# Patient Record
Sex: Female | Born: 1940
Health system: Southern US, Community
[De-identification: ages and names within clinical notes are randomized; demographics above are authoritative.]

## PROBLEM LIST (undated history)

## (undated) DIAGNOSIS — K219 Gastro-esophageal reflux disease without esophagitis: Secondary | ICD-10-CM

## (undated) DIAGNOSIS — T7840XA Allergy, unspecified, initial encounter: Secondary | ICD-10-CM

## (undated) DIAGNOSIS — I10 Essential (primary) hypertension: Secondary | ICD-10-CM

## (undated) DIAGNOSIS — G5600 Carpal tunnel syndrome, unspecified upper limb: Secondary | ICD-10-CM

## (undated) DIAGNOSIS — E7800 Pure hypercholesterolemia, unspecified: Secondary | ICD-10-CM

## (undated) HISTORY — PX: EYE SURGERY: SHX253

## (undated) HISTORY — PX: BREAST CYST ASPIRATION: SHX578

## (undated) HISTORY — PX: CARPAL TUNNEL RELEASE: SHX101

## (undated) HISTORY — DX: Carpal tunnel syndrome, unspecified upper limb: G56.00

## (undated) HISTORY — DX: Pure hypercholesterolemia, unspecified: E78.00

## (undated) HISTORY — DX: Allergy, unspecified, initial encounter: T78.40XA

## (undated) HISTORY — PX: TONSILLECTOMY: SUR1361

## (undated) HISTORY — DX: Essential (primary) hypertension: I10

## (undated) HISTORY — DX: Gastro-esophageal reflux disease without esophagitis: K21.9

## (undated) HISTORY — PX: COLONOSCOPY: SHX174

---
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
## 1997-04-07 DIAGNOSIS — G5600 Carpal tunnel syndrome, unspecified upper limb: Secondary | ICD-10-CM

## 1997-04-07 HISTORY — DX: Carpal tunnel syndrome, unspecified upper limb: G56.00

## 2004-01-31 ENCOUNTER — Ambulatory Visit: Payer: Self-pay | Admitting: Internal Medicine

## 2005-02-03 ENCOUNTER — Ambulatory Visit: Payer: Self-pay | Admitting: Unknown Physician Specialty

## 2005-02-17 ENCOUNTER — Ambulatory Visit: Payer: Self-pay | Admitting: Internal Medicine

## 2006-02-04 ENCOUNTER — Ambulatory Visit: Payer: Self-pay | Admitting: Internal Medicine

## 2006-02-17 ENCOUNTER — Ambulatory Visit: Payer: Self-pay | Admitting: Gastroenterology

## 2006-07-23 ENCOUNTER — Ambulatory Visit: Payer: Self-pay | Admitting: Gastroenterology

## 2006-08-31 ENCOUNTER — Ambulatory Visit: Payer: Self-pay | Admitting: Gastroenterology

## 2007-03-16 ENCOUNTER — Ambulatory Visit: Payer: Self-pay | Admitting: Internal Medicine

## 2007-09-15 ENCOUNTER — Ambulatory Visit: Payer: Self-pay | Admitting: Internal Medicine

## 2007-12-14 ENCOUNTER — Ambulatory Visit: Payer: Self-pay | Admitting: Internal Medicine

## 2007-12-20 ENCOUNTER — Ambulatory Visit: Payer: Self-pay | Admitting: Internal Medicine

## 2007-12-28 ENCOUNTER — Ambulatory Visit: Payer: Self-pay

## 2008-03-16 ENCOUNTER — Ambulatory Visit: Payer: Self-pay | Admitting: Internal Medicine

## 2008-10-20 ENCOUNTER — Ambulatory Visit: Payer: Self-pay | Admitting: Orthopedic Surgery

## 2008-12-28 ENCOUNTER — Ambulatory Visit: Payer: Self-pay | Admitting: Orthopedic Surgery

## 2009-03-19 ENCOUNTER — Ambulatory Visit: Payer: Self-pay | Admitting: Internal Medicine

## 2009-05-02 ENCOUNTER — Ambulatory Visit: Payer: Self-pay

## 2010-03-27 ENCOUNTER — Ambulatory Visit: Payer: Self-pay | Admitting: Internal Medicine

## 2011-04-07 ENCOUNTER — Ambulatory Visit: Payer: Self-pay | Admitting: Internal Medicine

## 2011-04-07 LAB — HM MAMMOGRAPHY

## 2011-04-09 DIAGNOSIS — R7309 Other abnormal glucose: Secondary | ICD-10-CM | POA: Diagnosis not present

## 2011-04-09 DIAGNOSIS — E7800 Pure hypercholesterolemia, unspecified: Secondary | ICD-10-CM | POA: Diagnosis not present

## 2011-04-09 DIAGNOSIS — J309 Allergic rhinitis, unspecified: Secondary | ICD-10-CM | POA: Diagnosis not present

## 2011-04-09 DIAGNOSIS — I10 Essential (primary) hypertension: Secondary | ICD-10-CM | POA: Diagnosis not present

## 2011-06-03 DIAGNOSIS — I10 Essential (primary) hypertension: Secondary | ICD-10-CM | POA: Diagnosis not present

## 2011-06-03 DIAGNOSIS — E7800 Pure hypercholesterolemia, unspecified: Secondary | ICD-10-CM | POA: Diagnosis not present

## 2011-06-04 DIAGNOSIS — R5381 Other malaise: Secondary | ICD-10-CM | POA: Diagnosis not present

## 2011-06-04 DIAGNOSIS — R7989 Other specified abnormal findings of blood chemistry: Secondary | ICD-10-CM | POA: Diagnosis not present

## 2011-06-11 DIAGNOSIS — L570 Actinic keratosis: Secondary | ICD-10-CM | POA: Diagnosis not present

## 2011-06-11 DIAGNOSIS — L821 Other seborrheic keratosis: Secondary | ICD-10-CM | POA: Diagnosis not present

## 2011-08-15 DIAGNOSIS — R7309 Other abnormal glucose: Secondary | ICD-10-CM | POA: Diagnosis not present

## 2011-08-15 DIAGNOSIS — E7800 Pure hypercholesterolemia, unspecified: Secondary | ICD-10-CM | POA: Diagnosis not present

## 2011-08-15 DIAGNOSIS — M549 Dorsalgia, unspecified: Secondary | ICD-10-CM | POA: Diagnosis not present

## 2011-08-15 DIAGNOSIS — R5383 Other fatigue: Secondary | ICD-10-CM | POA: Diagnosis not present

## 2011-08-15 DIAGNOSIS — R7989 Other specified abnormal findings of blood chemistry: Secondary | ICD-10-CM | POA: Diagnosis not present

## 2011-08-15 LAB — HEPATIC FUNCTION PANEL: ALT: 20 U/L (ref 7–35)

## 2011-08-15 LAB — HEMOGLOBIN A1C: Hgb A1c MFr Bld: 6.1 % — AB (ref 4.0–6.0)

## 2011-08-15 LAB — BASIC METABOLIC PANEL: Creatinine: 0.8 mg/dL (ref <=1.1)

## 2011-08-15 LAB — TSH: TSH: 1.53 u[IU]/mL (ref <=5.90)

## 2011-08-15 LAB — LIPID PANEL
Cholesterol: 207 mg/dL — AB (ref 0–200)
HDL: 44 mg/dL (ref 35–70)

## 2011-10-31 DIAGNOSIS — IMO0002 Reserved for concepts with insufficient information to code with codable children: Secondary | ICD-10-CM | POA: Diagnosis not present

## 2011-10-31 DIAGNOSIS — M9981 Other biomechanical lesions of cervical region: Secondary | ICD-10-CM | POA: Diagnosis not present

## 2011-10-31 DIAGNOSIS — M999 Biomechanical lesion, unspecified: Secondary | ICD-10-CM | POA: Diagnosis not present

## 2011-10-31 DIAGNOSIS — M5030 Other cervical disc degeneration, unspecified cervical region: Secondary | ICD-10-CM | POA: Diagnosis not present

## 2011-11-12 DIAGNOSIS — M5030 Other cervical disc degeneration, unspecified cervical region: Secondary | ICD-10-CM | POA: Diagnosis not present

## 2011-11-12 DIAGNOSIS — M5137 Other intervertebral disc degeneration, lumbosacral region: Secondary | ICD-10-CM | POA: Diagnosis not present

## 2011-11-12 DIAGNOSIS — M9981 Other biomechanical lesions of cervical region: Secondary | ICD-10-CM | POA: Diagnosis not present

## 2011-11-12 DIAGNOSIS — M999 Biomechanical lesion, unspecified: Secondary | ICD-10-CM | POA: Diagnosis not present

## 2012-01-09 ENCOUNTER — Telehealth: Payer: Self-pay | Admitting: Internal Medicine

## 2012-01-09 DIAGNOSIS — Z139 Encounter for screening, unspecified: Secondary | ICD-10-CM

## 2012-01-09 NOTE — Telephone Encounter (Signed)
Pt called made appointment for jan. She stated she needs to have her mammogram before the end of year She goes to [place].  Pt would like appointment 9-10 am Can you place order for this

## 2012-01-14 DIAGNOSIS — H35369 Drusen (degenerative) of macula, unspecified eye: Secondary | ICD-10-CM | POA: Diagnosis not present

## 2012-03-15 ENCOUNTER — Other Ambulatory Visit: Payer: Self-pay | Admitting: *Deleted

## 2012-03-15 NOTE — Telephone Encounter (Signed)
Script faxed to pharmacy

## 2012-04-13 ENCOUNTER — Encounter: Payer: Self-pay | Admitting: Internal Medicine

## 2012-04-13 ENCOUNTER — Ambulatory Visit: Payer: Self-pay | Admitting: Internal Medicine

## 2012-04-13 DIAGNOSIS — Z1231 Encounter for screening mammogram for malignant neoplasm of breast: Secondary | ICD-10-CM | POA: Diagnosis not present

## 2012-04-22 ENCOUNTER — Other Ambulatory Visit: Payer: Self-pay | Admitting: Internal Medicine

## 2012-04-22 MED ORDER — CLONAZEPAM 0.5 MG PO TABS: 0.5000 mg | ORAL_TABLET | Freq: Every day | ORAL | Status: DC | PRN

## 2012-04-22 NOTE — Telephone Encounter (Signed)
Prescription faxed to pharmacy.

## 2012-04-22 NOTE — Telephone Encounter (Signed)
Clonazepam 0.5 mg   #30

## 2012-04-26 ENCOUNTER — Encounter: Payer: Self-pay | Admitting: Internal Medicine

## 2012-04-28 ENCOUNTER — Ambulatory Visit (INDEPENDENT_AMBULATORY_CARE_PROVIDER_SITE_OTHER): Payer: Medicare Other | Admitting: Internal Medicine

## 2012-04-28 ENCOUNTER — Encounter: Payer: Self-pay | Admitting: *Deleted

## 2012-04-28 ENCOUNTER — Encounter: Payer: Self-pay | Admitting: Internal Medicine

## 2012-04-28 VITALS — BP 126/80 | HR 84 | Temp 98.1°F | Ht 62.5 in | Wt 165.5 lb

## 2012-04-28 DIAGNOSIS — R7309 Other abnormal glucose: Secondary | ICD-10-CM

## 2012-04-28 DIAGNOSIS — E7800 Pure hypercholesterolemia, unspecified: Secondary | ICD-10-CM | POA: Diagnosis not present

## 2012-04-28 DIAGNOSIS — I10 Essential (primary) hypertension: Secondary | ICD-10-CM

## 2012-04-28 DIAGNOSIS — Z9109 Other allergy status, other than to drugs and biological substances: Secondary | ICD-10-CM | POA: Diagnosis not present

## 2012-04-28 DIAGNOSIS — R739 Hyperglycemia, unspecified: Secondary | ICD-10-CM

## 2012-04-28 MED ORDER — FLUTICASONE PROPIONATE 50 MCG/ACT NA SUSP: 2.0000 | Freq: Every day | NASAL | Status: DC

## 2012-04-28 MED ORDER — AZELASTINE HCL 0.1 % NA SOLN: 2.0000 | Freq: Every day | NASAL | Status: DC

## 2012-05-03 ENCOUNTER — Encounter: Payer: Self-pay | Admitting: Internal Medicine

## 2012-05-03 DIAGNOSIS — R739 Hyperglycemia, unspecified: Secondary | ICD-10-CM | POA: Insufficient documentation

## 2012-05-03 DIAGNOSIS — I10 Essential (primary) hypertension: Secondary | ICD-10-CM | POA: Insufficient documentation

## 2012-05-03 DIAGNOSIS — Z9109 Other allergy status, other than to drugs and biological substances: Secondary | ICD-10-CM | POA: Insufficient documentation

## 2012-05-03 DIAGNOSIS — E7800 Pure hypercholesterolemia, unspecified: Secondary | ICD-10-CM | POA: Insufficient documentation

## 2012-05-03 NOTE — Assessment & Plan Note (Signed)
Low carb diet, exercise and weight loss.  Check metabolic panel and a1c.   

## 2012-05-03 NOTE — Assessment & Plan Note (Signed)
Low cholesterol diet and exercise.  Weight loss.  Desires not to take cholesterol medication.  Check lipid panel.   

## 2012-05-03 NOTE — Assessment & Plan Note (Signed)
Blood pressure as outlined.  Have her spot check her pressure.  Record.  Same medication regimen.  States she does not always take it regularly.  Have her take her meds regularly.  Follow.  Check metabolic panel.

## 2012-05-03 NOTE — Assessment & Plan Note (Signed)
Intermittent flares.  Will restart her flonase, astelin and otc antihistamine.  Follow.  Notify me if persistent problems.

## 2012-05-03 NOTE — Progress Notes (Signed)
  Subjective:    [name], female    DOB: [birth year], 72 y.o.   MRN: [id number]  HPI 72 year old female with past history of reoccurring allergy problems, GERD, hypertension and hypercholesterolemia who comes in today for a scheduled follow up.  She states she has been doing relatively well.  She has had some intermittent problems with her allergies.  Feels stopped up at times.  No fever.  Has not been using her flonase or astelin.  Has been using some saline.  States her blood pressure checks (outside) - have been averaging 120-140 systolic readings.  Bowels are better. States she does not need the senokot now.  No urine change.    Past Medical History  Diagnosis Date  . Hypertension   . Hypercholesterolemia   . Allergy   . Carpal tunnel syndrome   . GERD (gastroesophageal reflux disease)     Current Outpatient Prescriptions on File Prior to Visit  Medication Sig Dispense Refill  . clonazePAM (KLONOPIN) 0.5 MG tablet Take 1 tablet (0.5 mg total) by mouth daily as needed.  30 tablet  0  . fluticasone (FLONASE) 50 MCG/ACT nasal spray Place 2 sprays into the nose daily.  16 g  3  . hydrochlorothiazide (HYDRODIURIL) 12.5 MG tablet Take 12.5 mg by mouth daily.      Marland Kitchen azelastine (ASTELIN) 137 MCG/SPRAY nasal spray Place 2 sprays into the nose daily. Use in each nostril as directed  30 mL  2  . lisinopril (PRINIVIL,ZESTRIL) 40 MG tablet Take 40 mg by mouth daily.        Review of Systems Patient denies any headache, lightheadedness or dizziness.  Some allergy symptoms as outlined.  No chest pain, tightness or palpitations.  No increased shortness of breath, cough or congestion.  No nausea or vomiting.  No acid reflux.  No abdominal pain or cramping.  No bowel change, such as diarrhea, constipation, BRBPR or melana.  No urine change.        Objective:   Physical Exam Filed Vitals:   04/28/12 0942  BP: 126/80  Pulse: 84  Temp: 98.1 F (74.88 C)   72 year old female in no acute  distress.   HEENT:  Nares- clear.  Oropharynx - without lesions. NECK:  Supple.  Nontender.  No audible bruit.  HEART:  Appears to be regular. LUNGS:  No crackles or wheezing audible.  Respirations even and unlabored.  RADIAL PULSE:  Equal bilaterally.   ABDOMEN:  Soft, nontender.  Bowel sounds present and normal.  No audible abdominal bruit.    EXTREMITIES:  No increased edema present.  DP pulses palpable and equal bilaterally.           Assessment & Plan:  GI.  Last colonoscopy 02/17/06 - normal.  Recommend repeat colonoscopy 2017.  Bowels better.   HEALTH MAINTENANCE.  Physical 08/15/11.  Colonoscopy as outlined above.  Pap 08/13/10 - negative.  Mammogram 04/07/11 - Birads II.  Mammogram 04/13/12 - BiRADS II.

## 2012-05-07 ENCOUNTER — Other Ambulatory Visit: Payer: Federal, State, Local not specified - PPO

## 2012-05-31 ENCOUNTER — Other Ambulatory Visit: Payer: Self-pay | Admitting: *Deleted

## 2012-05-31 MED ORDER — CLONAZEPAM 0.5 MG PO TABS: 0.5000 mg | ORAL_TABLET | Freq: Every day | ORAL | Status: DC | PRN

## 2012-07-12 ENCOUNTER — Other Ambulatory Visit: Payer: Self-pay | Admitting: *Deleted

## 2012-07-13 MED ORDER — CLONAZEPAM 0.5 MG PO TABS: 0.5000 mg | ORAL_TABLET | Freq: Every day | ORAL | Status: DC | PRN

## 2012-08-10 ENCOUNTER — Other Ambulatory Visit (INDEPENDENT_AMBULATORY_CARE_PROVIDER_SITE_OTHER): Payer: Medicare Other

## 2012-08-10 DIAGNOSIS — R7309 Other abnormal glucose: Secondary | ICD-10-CM | POA: Diagnosis not present

## 2012-08-10 DIAGNOSIS — E7800 Pure hypercholesterolemia, unspecified: Secondary | ICD-10-CM

## 2012-08-10 DIAGNOSIS — R739 Hyperglycemia, unspecified: Secondary | ICD-10-CM

## 2012-08-10 DIAGNOSIS — I10 Essential (primary) hypertension: Secondary | ICD-10-CM | POA: Diagnosis not present

## 2012-08-10 LAB — COMPREHENSIVE METABOLIC PANEL
ALT: 18 U/L (ref 0–35)
AST: 23 U/L (ref 0–37)
Albumin: 3.9 g/dL (ref 3.5–5.2)
BUN: 17 mg/dL (ref 6–23)
Calcium: 9.3 mg/dL (ref 8.4–10.5)
Chloride: 106 mEq/L (ref 96–112)
Potassium: 4.5 mEq/L (ref 3.5–5.1)
Total Protein: 7 g/dL (ref 6.0–8.3)

## 2012-08-10 LAB — LIPID PANEL
Cholesterol: 188 mg/dL (ref 0–200)
LDL Cholesterol: 120 mg/dL — ABNORMAL HIGH (ref 0–99)
Total CHOL/HDL Ratio: 5

## 2012-08-24 ENCOUNTER — Encounter: Payer: Federal, State, Local not specified - PPO | Admitting: Internal Medicine

## 2012-10-07 ENCOUNTER — Other Ambulatory Visit: Payer: Self-pay | Admitting: *Deleted

## 2012-10-07 MED ORDER — CLONAZEPAM 0.5 MG PO TABS: 0.5000 mg | ORAL_TABLET | Freq: Every day | ORAL | Status: DC | PRN

## 2012-10-07 NOTE — Telephone Encounter (Signed)
Refilled clonazepam #30 with one refill.   

## 2012-10-07 NOTE — Telephone Encounter (Signed)
faxed

## 2012-10-13 ENCOUNTER — Other Ambulatory Visit (HOSPITAL_COMMUNITY)
Admission: RE | Admit: 2012-10-13 | Discharge: 2012-10-13 | Disposition: A | Discharge status: Home / Self Care | Payer: Medicare Other | Source: Ambulatory Visit | Attending: Internal Medicine | Admitting: Internal Medicine

## 2012-10-13 ENCOUNTER — Ambulatory Visit (INDEPENDENT_AMBULATORY_CARE_PROVIDER_SITE_OTHER): Payer: Medicare Other | Admitting: Internal Medicine

## 2012-10-13 ENCOUNTER — Encounter: Payer: Self-pay | Admitting: Internal Medicine

## 2012-10-13 VITALS — BP 130/90 | HR 72 | Temp 98.4°F | Ht 62.25 in | Wt 163.5 lb

## 2012-10-13 DIAGNOSIS — R739 Hyperglycemia, unspecified: Secondary | ICD-10-CM

## 2012-10-13 DIAGNOSIS — Z1211 Encounter for screening for malignant neoplasm of colon: Secondary | ICD-10-CM | POA: Diagnosis not present

## 2012-10-13 DIAGNOSIS — R7309 Other abnormal glucose: Secondary | ICD-10-CM

## 2012-10-13 DIAGNOSIS — E7800 Pure hypercholesterolemia, unspecified: Secondary | ICD-10-CM | POA: Diagnosis not present

## 2012-10-13 DIAGNOSIS — I10 Essential (primary) hypertension: Secondary | ICD-10-CM | POA: Diagnosis not present

## 2012-10-13 DIAGNOSIS — Z9109 Other allergy status, other than to drugs and biological substances: Secondary | ICD-10-CM

## 2012-10-13 DIAGNOSIS — Z124 Encounter for screening for malignant neoplasm of cervix: Secondary | ICD-10-CM | POA: Diagnosis not present

## 2012-10-15 ENCOUNTER — Encounter: Payer: Self-pay | Admitting: *Deleted

## 2012-10-16 ENCOUNTER — Encounter: Payer: Self-pay | Admitting: Internal Medicine

## 2012-10-16 NOTE — Assessment & Plan Note (Signed)
Intermittent flares.  Use flonase, astelin and otc antihistamine.  Follow.  Notify me if persistent problems.   

## 2012-10-16 NOTE — Assessment & Plan Note (Signed)
Low carb diet, exercise and weight loss.  Follow metabolic panel and a1c.  Last a1c 08/10/12 - 6.2.

## 2012-10-16 NOTE — Assessment & Plan Note (Signed)
Low cholesterol diet and exercise.  Weight loss.  Desires not to take cholesterol medication.  Check lipid panel.   

## 2012-10-16 NOTE — Progress Notes (Signed)
Subjective:    [name], female    DOB: 1940/10/06, 72 y.o.   MRN: [id number]  HPI 72 year old female with past history of reoccurring allergy problems, GERD, hypertension and hypercholesterolemia who comes in today to follow up on these issues as well as for a complete physical exam.  She states she has been doing relatively well.  She has had some intermittent problems with her allergies - drainage.   No fever.  Has not been using her flonase or astelin.  Has been using some saline.  States her blood pressure checks (outside) - have been averaging 128-130/ 70-80.   Bowels stable.  No urine change.     Past Medical History  Diagnosis Date  . Hypertension   . Hypercholesterolemia   . Allergy   . Carpal tunnel syndrome   . GERD (gastroesophageal reflux disease)     Current Outpatient Prescriptions on File Prior to Visit  Medication Sig Dispense Refill  . aspirin (ASPIR-81) 81 MG EC tablet Take 81 mg by mouth daily. Swallow whole.      Marland Kitchen azelastine (ASTELIN) 137 MCG/SPRAY nasal spray Place 2 sprays into the nose daily. Use in each nostril as directed  30 mL  2  . Cholecalciferol (VITAMIN D3) 1000 UNITS CAPS Take by mouth 2 (two) times daily.       . clonazePAM (KLONOPIN) 0.5 MG tablet Take 1 tablet (0.5 mg total) by mouth daily as needed.  30 tablet  1  . CYANOCOBALAMIN PO Take by mouth.      . fluticasone (FLONASE) 50 MCG/ACT nasal spray Place 2 sprays into the nose daily.  16 g  3  . hydrochlorothiazide (HYDRODIURIL) 12.5 MG tablet Take 12.5 mg by mouth as needed.       Marland Kitchen lisinopril (PRINIVIL,ZESTRIL) 40 MG tablet Take 40 mg by mouth daily.      . Omega-3 Fatty Acids (FISH OIL) 1000 MG CAPS Take by mouth 3 (three) times daily.      . polyethylene glycol (MIRALAX / GLYCOLAX) packet Take 17 g by mouth daily.      Marland Kitchen Spirulina 500 MG TABS Take by mouth 2 (two) times daily.      . vitamin C (ASCORBIC ACID) 500 MG tablet Take 500 mg by mouth 2 (two) times daily.       No current  facility-administered medications on file prior to visit.    Review of Systems Patient denies any headache, lightheadedness or dizziness.  Some allergy symptoms as outlined.  No chest pain, tightness or palpitations.  No increased shortness of breath, cough or congestion.  No nausea or vomiting.  No acid reflux.  No abdominal pain or cramping.  No bowel change, such as diarrhea, constipation, BRBPR or melana.  No urine change.  Overall she feels she is doing well.       Objective:   Physical Exam  Filed Vitals:   10/13/12 1004  BP: 130/90  Pulse: 72  Temp: 98.4 F (9.71 C)   72 year old female in no acute distress.   HEENT:  Nares- clear.  Oropharynx - without lesions. NECK:  Supple.  Nontender.  No audible bruit.  HEART:  Appears to be regular. LUNGS:  No crackles or wheezing audible.  Respirations even and unlabored.  RADIAL PULSE:  Equal bilaterally.    BREASTS:  No nipple discharge or nipple retraction present.  Could not appreciate any distinct nodules or axillary adenopathy.  ABDOMEN:  Soft, nontender.  Bowel  sounds present and normal.  No audible abdominal bruit.  GU:  Normal external genitalia.  Vaginal vault without lesions.  Cervix identified.  Pap performed. Could not appreciate any adnexal masses or tenderness.   RECTAL:  Heme negative.   EXTREMITIES:  No increased edema present.  DP pulses palpable and equal bilaterally.           Assessment & Plan:  GI.  Last colonoscopy 02/17/06 - normal.  Recommend repeat colonoscopy 2017.  Bowels better.   HEALTH MAINTENANCE.  Physical today.  Colonoscopy as outlined above.  Pap today.  Mammogram 04/07/11 - Birads II.  Mammogram 04/13/12 - BiRADS II.  IFOB today.

## 2012-10-16 NOTE — Assessment & Plan Note (Addendum)
Blood pressure as outlined.  Continue to spot check her pressure.  Record.  Same medication regimen.  Follow metabolic panel.   

## 2012-12-15 ENCOUNTER — Other Ambulatory Visit: Payer: Self-pay | Admitting: *Deleted

## 2012-12-15 MED ORDER — CLONAZEPAM 0.5 MG PO TABS: 0.5000 mg | ORAL_TABLET | Freq: Every day | ORAL | Status: DC | PRN

## 2012-12-15 NOTE — Telephone Encounter (Signed)
Refilled clonazepam #30 with one refill.   

## 2012-12-16 ENCOUNTER — Encounter: Payer: Self-pay | Admitting: *Deleted

## 2012-12-22 DIAGNOSIS — Z79899 Other long term (current) drug therapy: Secondary | ICD-10-CM | POA: Diagnosis not present

## 2013-01-10 ENCOUNTER — Encounter: Payer: Self-pay | Admitting: Internal Medicine

## 2013-03-24 ENCOUNTER — Other Ambulatory Visit: Payer: Self-pay | Admitting: *Deleted

## 2013-03-24 MED ORDER — FLUTICASONE PROPIONATE 50 MCG/ACT NA SUSP: 2.0000 | Freq: Every day | NASAL | Status: DC

## 2013-03-24 MED ORDER — CLONAZEPAM 0.5 MG PO TABS: 0.5000 mg | ORAL_TABLET | Freq: Every day | ORAL | Status: DC | PRN

## 2013-03-24 NOTE — Telephone Encounter (Signed)
Refilled clonazepam #30 with one refill.   

## 2013-03-24 NOTE — Telephone Encounter (Signed)
Last visit 10/13/12

## 2013-04-21 ENCOUNTER — Ambulatory Visit: Payer: Self-pay | Admitting: Internal Medicine

## 2013-04-21 ENCOUNTER — Encounter: Payer: Self-pay | Admitting: Internal Medicine

## 2013-04-21 DIAGNOSIS — Z1231 Encounter for screening mammogram for malignant neoplasm of breast: Secondary | ICD-10-CM | POA: Diagnosis not present

## 2013-04-21 LAB — HM MAMMOGRAPHY: HM MAMMO: NEGATIVE

## 2013-06-08 DIAGNOSIS — H2510 Age-related nuclear cataract, unspecified eye: Secondary | ICD-10-CM | POA: Diagnosis not present

## 2013-06-30 ENCOUNTER — Other Ambulatory Visit: Payer: Self-pay | Admitting: *Deleted

## 2013-07-01 MED ORDER — CLONAZEPAM 0.5 MG PO TABS: 0.5000 mg | ORAL_TABLET | Freq: Every day | ORAL | Status: DC | PRN

## 2013-07-01 NOTE — Telephone Encounter (Signed)
Refilled clonazepam #30 with no refills.   

## 2013-07-01 NOTE — Telephone Encounter (Signed)
Okay to refill? 

## 2013-09-21 ENCOUNTER — Telehealth: Payer: Self-pay | Admitting: *Deleted

## 2013-09-21 NOTE — Telephone Encounter (Signed)
Refill fax requesting Clonazepam 0.5mg  #30.  Last OV 7.9.14, last refill 3.27.15.  Please advise refill in Dr Bary Leriche absence.

## 2013-09-21 NOTE — Telephone Encounter (Signed)
No she will have to wait since there is no documentation in chart of why she was prescribed it and has not been seen since July

## 2013-09-22 MED ORDER — CLONAZEPAM 0.5 MG PO TABS: 0.5000 mg | ORAL_TABLET | Freq: Every day | ORAL | Status: DC | PRN

## 2013-09-22 NOTE — Telephone Encounter (Signed)
Pt notified. Is out of town, will call back to schedule physical. Rx phone to pharmacy.

## 2013-09-22 NOTE — Telephone Encounter (Signed)
Please advise on refill.

## 2013-09-22 NOTE — Telephone Encounter (Signed)
I am ok to refill this medication.  She needs a physical after 10/13/13.  Ok to refill until appt.  Thanks.

## 2013-10-14 ENCOUNTER — Ambulatory Visit (INDEPENDENT_AMBULATORY_CARE_PROVIDER_SITE_OTHER): Payer: Medicare Other | Admitting: Internal Medicine

## 2013-10-14 ENCOUNTER — Encounter: Payer: Self-pay | Admitting: Internal Medicine

## 2013-10-14 VITALS — BP 130/80 | HR 75 | Temp 98.6°F | Ht 62.25 in | Wt 161.2 lb

## 2013-10-14 DIAGNOSIS — R7309 Other abnormal glucose: Secondary | ICD-10-CM | POA: Diagnosis not present

## 2013-10-14 DIAGNOSIS — Z1211 Encounter for screening for malignant neoplasm of colon: Secondary | ICD-10-CM

## 2013-10-14 DIAGNOSIS — E7800 Pure hypercholesterolemia, unspecified: Secondary | ICD-10-CM

## 2013-10-14 DIAGNOSIS — I10 Essential (primary) hypertension: Secondary | ICD-10-CM

## 2013-10-14 DIAGNOSIS — Z9109 Other allergy status, other than to drugs and biological substances: Secondary | ICD-10-CM

## 2013-10-14 DIAGNOSIS — R739 Hyperglycemia, unspecified: Secondary | ICD-10-CM

## 2013-10-14 NOTE — Progress Notes (Signed)
Pre visit review using our clinic review tool, if applicable. No additional management support is needed unless otherwise documented below in the visit note. 

## 2013-10-19 ENCOUNTER — Encounter: Payer: Self-pay | Admitting: Internal Medicine

## 2013-10-19 ENCOUNTER — Telehealth: Payer: Self-pay | Admitting: *Deleted

## 2013-10-19 DIAGNOSIS — E7800 Pure hypercholesterolemia, unspecified: Secondary | ICD-10-CM

## 2013-10-19 DIAGNOSIS — R739 Hyperglycemia, unspecified: Secondary | ICD-10-CM

## 2013-10-19 DIAGNOSIS — I10 Essential (primary) hypertension: Secondary | ICD-10-CM

## 2013-10-19 DIAGNOSIS — R5383 Other fatigue: Secondary | ICD-10-CM

## 2013-10-19 NOTE — Assessment & Plan Note (Signed)
Low cholesterol diet and exercise.  Weight loss.  Desires not to take cholesterol medication.  Check lipid panel.

## 2013-10-19 NOTE — Assessment & Plan Note (Signed)
Low carb diet, exercise and weight loss.  Follow metabolic panel and H8I.

## 2013-10-19 NOTE — Assessment & Plan Note (Signed)
Intermittent flares.  Use flonase, astelin and otc antihistamine.  Follow.  Notify me if persistent problems.

## 2013-10-19 NOTE — Telephone Encounter (Signed)
Pt is coming in tomorrow what labs and dx?  

## 2013-10-19 NOTE — Assessment & Plan Note (Signed)
Blood pressure as outlined.  Continue to spot check her pressure.  Record.  Same medication regimen.  Follow metabolic panel.

## 2013-10-19 NOTE — Telephone Encounter (Signed)
Orders placed for labs

## 2013-10-19 NOTE — Progress Notes (Signed)
Subjective:    [name], female    DOB: 07-18-1940, 73 y.o.   MRN: [id number]  HPI 73 year old female with past history of reoccurring allergy problems, GERD, hypertension and hypercholesterolemia who comes in today for a scheduled follow up.   She states she has been doing relatively well.  She has had some intermittent problems with her allergies - drainage.   No fever.  Overall appears to be stable.   States her blood pressure checks (outside) - have been doing well.   Bowels stable.  No urine change.  Handling stress relatively well.  No chest pain or tightness.  No sob. Averages taking her blood pressure medication qod.      Past Medical History  Diagnosis Date  . Hypertension   . Hypercholesterolemia   . Allergy   . Carpal tunnel syndrome   . GERD (gastroesophageal reflux disease)     Current Outpatient Prescriptions on File Prior to Visit  Medication Sig Dispense Refill  . aspirin (ASPIR-81) 81 MG EC tablet Take 81 mg by mouth daily. Swallow whole.      Marland Kitchen azelastine (ASTELIN) 137 MCG/SPRAY nasal spray Place 2 sprays into the nose daily. Use in each nostril as directed  30 mL  2  . Cholecalciferol (VITAMIN D3) 1000 UNITS CAPS Take by mouth 2 (two) times daily.       . clonazePAM (KLONOPIN) 0.5 MG tablet Take 1 tablet (0.5 mg total) by mouth daily as needed.  30 tablet  1  . CYANOCOBALAMIN PO Take by mouth.      . fluticasone (FLONASE) 50 MCG/ACT nasal spray Place 2 sprays into both nostrils daily.  16 g  3  . hydrochlorothiazide (HYDRODIURIL) 12.5 MG tablet Take 12.5 mg by mouth as needed.       Marland Kitchen LECITHIN PO Take 800 mg by mouth daily.      Marland Kitchen lisinopril (PRINIVIL,ZESTRIL) 40 MG tablet Take 40 mg by mouth daily.      . Omega-3 Fatty Acids (FISH OIL) 1000 MG CAPS Take by mouth 3 (three) times daily.      . polyethylene glycol (MIRALAX / GLYCOLAX) packet Take 17 g by mouth daily.      Marland Kitchen Spirulina 500 MG TABS Take by mouth 2 (two) times daily.      . vitamin C (ASCORBIC  ACID) 500 MG tablet Take 500 mg by mouth 2 (two) times daily.       No current facility-administered medications on file prior to visit.    Review of Systems Patient denies any headache, lightheadedness or dizziness.  Some minimal allergy symptoms.  Overall stable.  No chest pain, tightness or palpitations.  No increased shortness of breath, cough or congestion.  No nausea or vomiting.  No acid reflux.  No abdominal pain or cramping.  No bowel change, such as diarrhea, constipation, BRBPR or melana.  No urine change.  Overall she feels she is doing well.  Handling stress well.  Blood pressure she reports - under good control.      Objective:   Physical Exam  Filed Vitals:   10/14/13 0948  BP: 130/80  Pulse: 75  Temp: 98.6 F (37 C)   Blood pressure recheck:  79/16  73 year old female in no acute distress.   HEENT:  Nares- clear.  Oropharynx - without lesions. NECK:  Supple.  Nontender.  No audible bruit.  HEART:  Appears to be regular. LUNGS:  No crackles or wheezing audible.  Respirations even and unlabored.  RADIAL PULSE:  Equal bilaterally.    BREASTS:  No nipple discharge or nipple retraction present.  Could not appreciate any distinct nodules or axillary adenopathy.  ABDOMEN:  Soft, nontender.  Bowel sounds present and normal.  No audible abdominal bruit.  GU:  Not performed.     EXTREMITIES:  No increased edema present.  DP pulses palpable and equal bilaterally.      FEET:  No lesions.       Assessment & Plan:  GI.  Last colonoscopy 02/17/06 - normal.  Recommend repeat colonoscopy 2017.  Bowels better.   HEALTH MAINTENANCE.  Physical today.  Colonoscopy as outlined above.  Pap 10/13/12 negative.  Mammogram 04/21/13 Birads I.  IFOB given today.

## 2013-10-20 ENCOUNTER — Other Ambulatory Visit (INDEPENDENT_AMBULATORY_CARE_PROVIDER_SITE_OTHER): Payer: Medicare Other

## 2013-10-20 DIAGNOSIS — I10 Essential (primary) hypertension: Secondary | ICD-10-CM | POA: Diagnosis not present

## 2013-10-20 DIAGNOSIS — E7800 Pure hypercholesterolemia, unspecified: Secondary | ICD-10-CM

## 2013-10-20 DIAGNOSIS — R5381 Other malaise: Secondary | ICD-10-CM

## 2013-10-20 DIAGNOSIS — R7309 Other abnormal glucose: Secondary | ICD-10-CM

## 2013-10-20 DIAGNOSIS — R5383 Other fatigue: Secondary | ICD-10-CM

## 2013-10-20 DIAGNOSIS — R739 Hyperglycemia, unspecified: Secondary | ICD-10-CM

## 2013-10-20 LAB — COMPREHENSIVE METABOLIC PANEL
ALBUMIN: 3.7 g/dL (ref 3.5–5.2)
ALT: 19 U/L (ref 0–35)
AST: 20 U/L (ref 0–37)
Alkaline Phosphatase: 83 U/L (ref 39–117)
BUN: 17 mg/dL (ref 6–23)
CHLORIDE: 106 meq/L (ref 96–112)
CO2: 28 mEq/L (ref 19–32)
Calcium: 9.6 mg/dL (ref 8.4–10.5)
Creatinine, Ser: 0.8 mg/dL (ref 0.4–1.2)
GFR: 71.1 mL/min (ref >60.00)
GLUCOSE: 92 mg/dL (ref 70–99)
POTASSIUM: 4.4 meq/L (ref 3.5–5.1)
Sodium: 139 mEq/L (ref 135–145)
Total Bilirubin: 0.6 mg/dL (ref 0.2–1.2)
Total Protein: 6.4 g/dL (ref 6.0–8.3)

## 2013-10-20 LAB — LIPID PANEL
Cholesterol: 208 mg/dL — ABNORMAL HIGH (ref 0–200)
HDL: 43.3 mg/dL (ref >39.00)
LDL CALC: 142 mg/dL — AB (ref 0–99)
NONHDL: 164.7
Total CHOL/HDL Ratio: 5
Triglycerides: 115 mg/dL (ref 0.0–149.0)
VLDL: 23 mg/dL (ref 0.0–40.0)

## 2013-10-20 LAB — CBC WITH DIFFERENTIAL/PLATELET
BASOS ABS: 0 10*3/uL (ref 0.0–0.1)
Basophils Relative: 0.4 % (ref 0.0–3.0)
Eosinophils Absolute: 0.3 10*3/uL (ref 0.0–0.7)
Eosinophils Relative: 3.2 % (ref 0.0–5.0)
HCT: 39.7 % (ref 36.0–46.0)
HEMOGLOBIN: 13.3 g/dL (ref 12.0–15.0)
LYMPHS PCT: 27.8 % (ref 12.0–46.0)
Lymphs Abs: 2.3 10*3/uL (ref 0.7–4.0)
MCHC: 33.4 g/dL (ref 30.0–36.0)
MCV: 86.2 fl (ref 78.0–100.0)
MONOS PCT: 9.3 % (ref 3.0–12.0)
Monocytes Absolute: 0.8 10*3/uL (ref 0.1–1.0)
Neutro Abs: 4.9 10*3/uL (ref 1.4–7.7)
Neutrophils Relative %: 59.3 % (ref 43.0–77.0)
PLATELETS: 247 10*3/uL (ref 150.0–400.0)
RBC: 4.61 Mil/uL (ref 3.87–5.11)
RDW: 13.3 % (ref 11.5–15.5)
WBC: 8.3 10*3/uL (ref 4.0–10.5)

## 2013-10-20 LAB — HEMOGLOBIN A1C: HEMOGLOBIN A1C: 6.2 % (ref 4.6–6.5)

## 2013-10-20 LAB — TSH: TSH: 1.17 u[IU]/mL (ref 0.35–4.50)

## 2013-10-21 ENCOUNTER — Encounter: Payer: Self-pay | Admitting: *Deleted

## 2013-11-02 ENCOUNTER — Encounter: Payer: Self-pay | Admitting: Podiatry

## 2013-11-02 ENCOUNTER — Ambulatory Visit (INDEPENDENT_AMBULATORY_CARE_PROVIDER_SITE_OTHER): Payer: Medicare Other | Admitting: Podiatry

## 2013-11-02 ENCOUNTER — Ambulatory Visit (INDEPENDENT_AMBULATORY_CARE_PROVIDER_SITE_OTHER): Payer: Medicare Other

## 2013-11-02 VITALS — BP 122/77 | HR 76 | Resp 16 | Ht 62.0 in | Wt 155.0 lb

## 2013-11-02 DIAGNOSIS — M722 Plantar fascial fibromatosis: Secondary | ICD-10-CM | POA: Diagnosis not present

## 2013-11-02 MED ORDER — MELOXICAM 15 MG PO TABS: 15.0000 mg | ORAL_TABLET | Freq: Every day | ORAL | Status: DC

## 2013-11-02 MED ORDER — METHYLPREDNISOLONE (PAK) 4 MG PO TABS: ORAL_TABLET | ORAL | Status: DC

## 2013-11-02 NOTE — Progress Notes (Signed)
   Subjective:    [name], female    DOB: [birth year], 73 y.o.   MRN: [id number]  HPI Comments: Been having heel in the right heel. It has been about two months ago, been doing stretching , icing and ibuprofen . It has helped      Review of Systems  HENT:       Ringing in ears   Allergic/Immunologic: Positive for environmental allergies.  All other systems reviewed and are negative.      Objective:   Physical Exam: I have reviewed her past medical history medications allergies surgeries social history and review of systems. Pulses are strongly palpable bilateral. Neurologic sensorium is intact per Semmes-Weinstein monofilament. Deep tendon reflexes are intact bilateral muscle strength is 5 over 5 dorsiflexors plantar flexors inverters everters all intrinsic musculature is intact. Orthopedic evaluation demonstrates all joints distal to the ankle a full range of motion without crepitation. However she does have pain on palpation medial continued tubercle of the right heel. Radiographic evaluation demonstrates soft tissue increase in density at the plantar fascial calcaneal insertion site indicative of plantar fasciitis.        Assessment & Plan:  Assessment: Plantar fasciitis right foot.  Plan: Injected her right heel today with Kenalog and local anesthetic applied a plantar fascial strapping. A Medrol Dosepak was dispensed as well as a prescription for MOBIC. We discussed appropriate shoe gear stretching exercises ice therapy and shoe gear modifications I will followup with her in one month.

## 2013-11-02 NOTE — Patient Instructions (Signed)
Plantar Fasciitis (Heel Spur Syndrome) with Rehab The plantar fascia is a fibrous, ligament-like, soft-tissue structure that spans the bottom of the foot. Plantar fasciitis is a condition that causes pain in the foot due to inflammation of the tissue. SYMPTOMS   Pain and tenderness on the underneath side of the foot.  Pain that worsens with standing or walking. CAUSES  Plantar fasciitis is caused by irritation and injury to the plantar fascia on the underneath side of the foot. Common mechanisms of injury include:  Direct trauma to bottom of the foot.  Damage to a small nerve that runs under the foot where the main fascia attaches to the heel bone.  Stress placed on the plantar fascia due to bone spurs. RISK INCREASES WITH:   Activities that place stress on the plantar fascia (running, jumping, pivoting, or cutting).  Poor strength and flexibility.  Improperly fitted shoes.  Tight calf muscles.  Flat feet.  Failure to warm-up properly before activity.  Obesity. PREVENTION  Warm up and stretch properly before activity.  Allow for adequate recovery between workouts.  Maintain physical fitness:  Strength, flexibility, and endurance.  Cardiovascular fitness.  Maintain a health body weight.  Avoid stress on the plantar fascia.  Wear properly fitted shoes, including arch supports for individuals who have flat feet. PROGNOSIS  If treated properly, then the symptoms of plantar fasciitis usually resolve without surgery. However, occasionally surgery is necessary. RELATED COMPLICATIONS   Recurrent symptoms that may result in a chronic condition.  Problems of the lower back that are caused by compensating for the injury, such as limping.  Pain or weakness of the foot during push-off following surgery.  Chronic inflammation, scarring, and partial or complete fascia tear, occurring more often from repeated injections. TREATMENT  Treatment initially involves the use of  ice and medication to help reduce pain and inflammation. The use of strengthening and stretching exercises may help reduce pain with activity, especially stretches of the Achilles tendon. These exercises may be performed at home or with a therapist. Your caregiver may recommend that you use heel cups of arch supports to help reduce stress on the plantar fascia. Occasionally, corticosteroid injections are given to reduce inflammation. If symptoms persist for greater than 6 months despite non-surgical (conservative), then surgery may be recommended.  MEDICATION   If pain medication is necessary, then nonsteroidal anti-inflammatory medications, such as aspirin and ibuprofen, or other minor pain relievers, such as acetaminophen, are often recommended.  Do not take pain medication within 7 days before surgery.  Prescription pain relievers may be given if deemed necessary by your caregiver. Use only as directed and only as much as you need.  Corticosteroid injections may be given by your caregiver. These injections should be reserved for the most serious cases, because they may only be given a certain number of times. HEAT AND COLD  Cold treatment (icing) relieves pain and reduces inflammation. Cold treatment should be applied for 10 to 15 minutes every 2 to 3 hours for inflammation and pain and immediately after any activity that aggravates your symptoms. Use ice packs or massage the area with a piece of ice (ice massage).  Heat treatment may be used prior to performing the stretching and strengthening activities prescribed by your caregiver, physical therapist, or athletic trainer. Use a heat pack or soak the injury in warm water. SEEK IMMEDIATE MEDICAL CARE IF:  Treatment seems to offer no benefit, or the condition worsens.  Any medications produce adverse side effects. EXERCISES RANGE   OF MOTION (ROM) AND STRETCHING EXERCISES - Plantar Fasciitis (Heel Spur Syndrome) These exercises may help you  when beginning to rehabilitate your injury. Your symptoms may resolve with or without further involvement from your physician, physical therapist or athletic trainer. While completing these exercises, remember:   Restoring tissue flexibility helps normal motion to return to the joints. This allows healthier, less painful movement and activity.  An effective stretch should be held for at least 30 seconds.  A stretch should never be painful. You should only feel a gentle lengthening or release in the stretched tissue. RANGE OF MOTION - Toe Extension, Flexion  Sit with your right / left leg crossed over your opposite knee.  Grasp your toes and gently pull them back toward the top of your foot. You should feel a stretch on the bottom of your toes and/or foot.  Hold this stretch for __________ seconds.  Now, gently pull your toes toward the bottom of your foot. You should feel a stretch on the top of your toes and or foot.  Hold this stretch for __________ seconds. Repeat __________ times. Complete this stretch __________ times per day.  RANGE OF MOTION - Ankle Dorsiflexion, Active Assisted  Remove shoes and sit on a chair that is preferably not on a carpeted surface.  Place right / left foot under knee. Extend your opposite leg for support.  Keeping your heel down, slide your right / left foot back toward the chair until you feel a stretch at your ankle or calf. If you do not feel a stretch, slide your bottom forward to the edge of the chair, while still keeping your heel down.  Hold this stretch for __________ seconds. Repeat __________ times. Complete this stretch __________ times per day.  STRETCH - Gastroc, Standing  Place hands on wall.  Extend right / left leg, keeping the front knee somewhat bent.  Slightly point your toes inward on your back foot.  Keeping your right / left heel on the floor and your knee straight, shift your weight toward the wall, not allowing your back to  arch.  You should feel a gentle stretch in the right / left calf. Hold this position for __________ seconds. Repeat __________ times. Complete this stretch __________ times per day. STRETCH - Soleus, Standing  Place hands on wall.  Extend right / left leg, keeping the other knee somewhat bent.  Slightly point your toes inward on your back foot.  Keep your right / left heel on the floor, bend your back knee, and slightly shift your weight over the back leg so that you feel a gentle stretch deep in your back calf.  Hold this position for __________ seconds. Repeat __________ times. Complete this stretch __________ times per day. STRETCH - Gastrocsoleus, Standing  Note: This exercise can place a lot of stress on your foot and ankle. Please complete this exercise only if specifically instructed by your caregiver.   Place the ball of your right / left foot on a step, keeping your other foot firmly on the same step.  Hold on to the wall or a rail for balance.  Slowly lift your other foot, allowing your body weight to press your heel down over the edge of the step.  You should feel a stretch in your right / left calf.  Hold this position for __________ seconds.  Repeat this exercise with a slight bend in your right / left knee. Repeat __________ times. Complete this stretch __________ times per day.    STRENGTHENING EXERCISES - Plantar Fasciitis (Heel Spur Syndrome)  These exercises may help you when beginning to rehabilitate your injury. They may resolve your symptoms with or without further involvement from your physician, physical therapist or athletic trainer. While completing these exercises, remember:   Muscles can gain both the endurance and the strength needed for everyday activities through controlled exercises.  Complete these exercises as instructed by your physician, physical therapist or athletic trainer. Progress the resistance and repetitions only as guided. STRENGTH -  Towel Curls  Sit in a chair positioned on a non-carpeted surface.  Place your foot on a towel, keeping your heel on the floor.  Pull the towel toward your heel by only curling your toes. Keep your heel on the floor.  If instructed by your physician, physical therapist or athletic trainer, add ____________________ at the end of the towel. Repeat __________ times. Complete this exercise __________ times per day. STRENGTH - Ankle Inversion  Secure one end of a rubber exercise band/tubing to a fixed object (table, pole). Loop the other end around your foot just before your toes.  Place your fists between your knees. This will focus your strengthening at your ankle.  Slowly, pull your big toe up and in, making sure the band/tubing is positioned to resist the entire motion.  Hold this position for __________ seconds.  Have your muscles resist the band/tubing as it slowly pulls your foot back to the starting position. Repeat __________ times. Complete this exercises __________ times per day.  Document Released: 03/24/2005 Document Revised: 06/16/2011 Document Reviewed: 07/06/2008 [employer] Patient Information 2015 [employer]. This information is not intended to replace advice given to you by your health care provider. Make sure you discuss any questions you have with your health care provider.  

## 2013-11-09 DIAGNOSIS — L821 Other seborrheic keratosis: Secondary | ICD-10-CM | POA: Diagnosis not present

## 2013-11-09 DIAGNOSIS — D1801 Hemangioma of skin and subcutaneous tissue: Secondary | ICD-10-CM | POA: Diagnosis not present

## 2013-11-09 DIAGNOSIS — L989 Disorder of the skin and subcutaneous tissue, unspecified: Secondary | ICD-10-CM | POA: Diagnosis not present

## 2013-12-05 ENCOUNTER — Ambulatory Visit: Payer: Medicare Other | Admitting: Podiatry

## 2013-12-26 ENCOUNTER — Other Ambulatory Visit: Payer: Self-pay | Admitting: *Deleted

## 2013-12-26 MED ORDER — CLONAZEPAM 0.5 MG PO TABS: 0.5000 mg | ORAL_TABLET | Freq: Every day | ORAL | Status: DC | PRN

## 2013-12-26 NOTE — Telephone Encounter (Signed)
Refill? Last OV 10/14/13

## 2013-12-26 NOTE — Telephone Encounter (Signed)
Rx faxed to pharmacy  

## 2013-12-26 NOTE — Telephone Encounter (Signed)
rx ok'd for #30 with one refill.

## 2014-02-15 ENCOUNTER — Encounter: Payer: Self-pay | Admitting: Internal Medicine

## 2014-02-15 ENCOUNTER — Ambulatory Visit (INDEPENDENT_AMBULATORY_CARE_PROVIDER_SITE_OTHER): Payer: Medicare Other | Admitting: Internal Medicine

## 2014-02-15 VITALS — BP 178/94 | HR 72 | Temp 98.2°F | Ht 62.0 in | Wt 159.5 lb

## 2014-02-15 DIAGNOSIS — R739 Hyperglycemia, unspecified: Secondary | ICD-10-CM

## 2014-02-15 DIAGNOSIS — Z91048 Other nonmedicinal substance allergy status: Secondary | ICD-10-CM | POA: Diagnosis not present

## 2014-02-15 DIAGNOSIS — E7800 Pure hypercholesterolemia, unspecified: Secondary | ICD-10-CM

## 2014-02-15 DIAGNOSIS — Z9109 Other allergy status, other than to drugs and biological substances: Secondary | ICD-10-CM

## 2014-02-15 DIAGNOSIS — M545 Low back pain: Secondary | ICD-10-CM | POA: Diagnosis not present

## 2014-02-15 DIAGNOSIS — I10 Essential (primary) hypertension: Secondary | ICD-10-CM | POA: Diagnosis not present

## 2014-02-15 DIAGNOSIS — F439 Reaction to severe stress, unspecified: Secondary | ICD-10-CM

## 2014-02-15 DIAGNOSIS — Z658 Other specified problems related to psychosocial circumstances: Secondary | ICD-10-CM

## 2014-02-15 LAB — COMPREHENSIVE METABOLIC PANEL
ALT: 18 U/L (ref 0–35)
AST: 20 U/L (ref 0–37)
Albumin: 3.2 g/dL — ABNORMAL LOW (ref 3.5–5.2)
Alkaline Phosphatase: 74 U/L (ref 39–117)
BILIRUBIN TOTAL: 0.7 mg/dL (ref 0.2–1.2)
BUN: 16 mg/dL (ref 6–23)
CO2: 24 meq/L (ref 19–32)
Calcium: 9.5 mg/dL (ref 8.4–10.5)
Chloride: 106 mEq/L (ref 96–112)
Creatinine, Ser: 0.7 mg/dL (ref 0.4–1.2)
GFR: 84.87 mL/min (ref >60.00)
GLUCOSE: 106 mg/dL — AB (ref 70–99)
Potassium: 4.6 mEq/L (ref 3.5–5.1)
Sodium: 141 mEq/L (ref 135–145)
Total Protein: 7 g/dL (ref 6.0–8.3)

## 2014-02-15 LAB — LIPID PANEL
Cholesterol: 190 mg/dL (ref 0–200)
HDL: 40.5 mg/dL (ref >39.00)
LDL CALC: 137 mg/dL — AB (ref 0–99)
NonHDL: 149.5
Total CHOL/HDL Ratio: 5
Triglycerides: 61 mg/dL (ref 0.0–149.0)
VLDL: 12.2 mg/dL (ref 0.0–40.0)

## 2014-02-15 LAB — HEMOGLOBIN A1C: HEMOGLOBIN A1C: 6.1 % (ref 4.6–6.5)

## 2014-02-15 NOTE — Progress Notes (Signed)
Pre visit review using our clinic review tool, if applicable. No additional management support is needed unless otherwise documented below in the visit note. 

## 2014-02-15 NOTE — Progress Notes (Signed)
Subjective:    [name], female    DOB: 09/05/1940, 73 y.o.   MRN: [id number]  HPI 73 year old female with past history of reoccurring allergy problems, GERD, hypertension and hypercholesterolemia who comes in today for a scheduled follow up.   She states she has been doing relatively well.  Overall allergies appear to be stable.  States her blood pressure checks (outside) - have been doing well - mostly averaging 120's/70-80s.  Bowels stable.  No urine change.  Handling stress relatively well.  No chest pain or tightness.  No sob. She does report she has been having problems with low back pain.  Started 3-4 weeks ago.  Worsened recently.  She took her husband's muscle relaxer and alleve (2 q am).  Better now.  Not a significant issue for her now.  Is aggravated with raking and mopping.  No significant pan down her legs.  No numbness or tingling.       Past Medical History  Diagnosis Date  . Hypertension   . Hypercholesterolemia   . Allergy   . Carpal tunnel syndrome   . GERD (gastroesophageal reflux disease)     Current Outpatient Prescriptions on File Prior to Visit  Medication Sig Dispense Refill  . aspirin (ASPIR-81) 81 MG EC tablet Take 81 mg by mouth daily. Swallow whole.    Marland Kitchen azelastine (ASTELIN) 137 MCG/SPRAY nasal spray Place 2 sprays into the nose daily. Use in each nostril as directed 30 mL 2  . Cholecalciferol (VITAMIN D3) 1000 UNITS CAPS Take by mouth 2 (two) times daily.     . clonazePAM (KLONOPIN) 0.5 MG tablet Take 1 tablet (0.5 mg total) by mouth daily as needed. 30 tablet 1  . CYANOCOBALAMIN PO Take by mouth.    . fluticasone (FLONASE) 50 MCG/ACT nasal spray Place 2 sprays into both nostrils daily. 16 g 3  . hydrochlorothiazide (HYDRODIURIL) 12.5 MG tablet Take 12.5 mg by mouth as needed.     Marland Kitchen LECITHIN PO Take 800 mg by mouth daily.    Marland Kitchen lisinopril (PRINIVIL,ZESTRIL) 40 MG tablet Take 40 mg by mouth daily.    . meloxicam (MOBIC) 15 MG tablet Take 1 tablet  (15 mg total) by mouth daily. 30 tablet 3  . methylPREDNIsolone (MEDROL DOSPACK) 4 MG tablet 6 day tapering dose-follow package instructions 21 tablet 0  . Omega-3 Fatty Acids (FISH OIL) 1000 MG CAPS Take by mouth 3 (three) times daily.    . polyethylene glycol (MIRALAX / GLYCOLAX) packet Take 17 g by mouth daily.    Marland Kitchen Spirulina 500 MG TABS Take by mouth 2 (two) times daily.    . vitamin C (ASCORBIC ACID) 500 MG tablet Take 500 mg by mouth 2 (two) times daily.     No current facility-administered medications on file prior to visit.    Review of Systems Patient denies any headache, lightheadedness or dizziness.  Some dry throat.  Overall allergies stable.  No chest pain, tightness or palpitations.  No increased shortness of breath, cough or congestion.  No nausea or vomiting.  No acid reflux.  No abdominal pain or cramping.  No bowel change, such as diarrhea, constipation, BRBPR or melana.  No urine change.  Overall she feels she is doing well.  Handling stress well.  Blood pressure she reports - under good control.      Objective:   Physical Exam  Filed Vitals:   02/15/14 0820  BP: 178/94  Pulse: 72  Temp: 98.2  F (36.8 C)   Blood pressure recheck:  86/1  73 year old female in no acute distress.   HEENT:  Nares- clear.  Oropharynx - without lesions. NECK:  Supple.  Nontender.  No audible bruit.  HEART:  Appears to be regular. LUNGS:  No crackles or wheezing audible.  Respirations even and unlabored.  RADIAL PULSE:  Equal bilaterally.   ABDOMEN:  Soft, nontender.  Bowel sounds present and normal.  No audible abdominal bruit.  BACK:  Non tender to palpation.  No significant pain with straight leg raise.  No pan with resistance against flexion and extension lower legs.       EXTREMITIES:  No increased edema present.  DP pulses palpable and equal bilaterally.      FEET:  No lesions.       Assessment & Plan:  GI.  Last colonoscopy 02/17/06 - normal.  Recommend repeat colonoscopy  2017.  Bowels better.   Essential hypertension Blood pressure appears to be doing well.  Follow.   - Comprehensive metabolic panel; Future  2. Hypercholesterolemia Low cholesterol diet and exercise.  Follow.   Lab Results  Component Value Date   CHOL 190 02/15/2014   HDL 40.50 02/15/2014   LDLCALC 137* 02/15/2014   TRIG 61.0 02/15/2014   CHOLHDL 5 02/15/2014  - Lipid panel; Future - Lipid panel  Hyperglycemia Low carb diet and exercise.  Follow.   - Hemoglobin A1c; Future  Environmental allergies Controlled.    Stress Feels she is dong well.  Takes clonazepam to rest.    Low back pain without sciatica, unspecified back pain laterality Better now.  Follow.    HEALTH MAINTENANCE.  Physical 10/14/13.  Colonoscopy as outlined above.  Pap 10/13/12 negative.  Mammogram 04/21/13 Birads I.  IFOB given today.    I spent 25 minutes with the patient and more than 50% of the time was spent in consultation regarding the above.

## 2014-02-16 ENCOUNTER — Encounter: Payer: Self-pay | Admitting: *Deleted

## 2014-02-16 ENCOUNTER — Encounter: Payer: Self-pay | Admitting: Internal Medicine

## 2014-02-16 DIAGNOSIS — M545 Low back pain, unspecified: Secondary | ICD-10-CM | POA: Insufficient documentation

## 2014-02-16 DIAGNOSIS — F439 Reaction to severe stress, unspecified: Secondary | ICD-10-CM | POA: Insufficient documentation

## 2014-04-03 DIAGNOSIS — S0502XA Injury of conjunctiva and corneal abrasion without foreign body, left eye, initial encounter: Secondary | ICD-10-CM | POA: Diagnosis not present

## 2014-04-10 DIAGNOSIS — S0502XD Injury of conjunctiva and corneal abrasion without foreign body, left eye, subsequent encounter: Secondary | ICD-10-CM | POA: Diagnosis not present

## 2014-04-11 ENCOUNTER — Other Ambulatory Visit: Payer: Self-pay | Admitting: *Deleted

## 2014-04-17 DIAGNOSIS — H2513 Age-related nuclear cataract, bilateral: Secondary | ICD-10-CM | POA: Diagnosis not present

## 2014-05-11 ENCOUNTER — Ambulatory Visit: Payer: Self-pay | Admitting: Internal Medicine

## 2014-05-11 DIAGNOSIS — Z1231 Encounter for screening mammogram for malignant neoplasm of breast: Secondary | ICD-10-CM | POA: Diagnosis not present

## 2014-05-11 LAB — HM MAMMOGRAPHY: HM Mammogram: NEGATIVE

## 2014-07-12 ENCOUNTER — Other Ambulatory Visit: Payer: Self-pay | Admitting: *Deleted

## 2014-07-12 MED ORDER — CLONAZEPAM 0.5 MG PO TABS: 0.5000 mg | ORAL_TABLET | Freq: Every day | ORAL | Status: DC | PRN

## 2014-07-12 NOTE — Telephone Encounter (Signed)
ok'd clonazepam #30 with one refill.

## 2014-07-12 NOTE — Telephone Encounter (Signed)
Rx faxed

## 2014-07-12 NOTE — Telephone Encounter (Signed)
Last visit 02/15/14

## 2014-07-21 ENCOUNTER — Ambulatory Visit (INDEPENDENT_AMBULATORY_CARE_PROVIDER_SITE_OTHER): Payer: Medicare Other | Admitting: Nurse Practitioner

## 2014-07-21 ENCOUNTER — Ambulatory Visit (INDEPENDENT_AMBULATORY_CARE_PROVIDER_SITE_OTHER)
Admission: RE | Admit: 2014-07-21 | Discharge: 2014-07-21 | Disposition: A | Discharge status: Home / Self Care | Payer: Medicare Other | Source: Ambulatory Visit | Attending: Nurse Practitioner | Admitting: Nurse Practitioner

## 2014-07-21 ENCOUNTER — Encounter: Payer: Self-pay | Admitting: Nurse Practitioner

## 2014-07-21 VITALS — BP 138/78 | HR 96 | Temp 98.5°F | Resp 14 | Ht 62.0 in | Wt 150.0 lb

## 2014-07-21 DIAGNOSIS — M542 Cervicalgia: Secondary | ICD-10-CM | POA: Diagnosis not present

## 2014-07-21 DIAGNOSIS — M5032 Other cervical disc degeneration, mid-cervical region: Secondary | ICD-10-CM | POA: Diagnosis not present

## 2014-07-21 MED ORDER — PREDNISONE 10 MG PO TABS: ORAL_TABLET | ORAL | Status: DC

## 2014-07-21 NOTE — Patient Instructions (Signed)
Prednisone with breakfast  6 tablets on day 1, 5 tablets on day 2, 4 tablets on day 3, 3 tablets on day 4, 2 tablets day 5, 1 tablet on day 6...done!   [place] [facility] Address:  Address: [address], [place], [place] [address]  [phone] X-rays until 4 pm.

## 2014-07-21 NOTE — Progress Notes (Signed)
Pre visit review using our clinic review tool, if applicable. No additional management support is needed unless otherwise documented below in the visit note. 

## 2014-07-21 NOTE — Progress Notes (Signed)
   Subjective:    [name], female    DOB: [birth year], 74 y.o.   MRN: [id number]  HPI  Donna Lara is a 74 yo female with a CC of neck pain.   1) Friday started having neck pain, she describes she had her pillows flexed neck slightly then felt some discomfort, worseining until Monday, massage therapy Tuesday helpful, she is working on her posture, felt like a bobble head she reports, pain into ear on right   Sharp when moving too fast Described as "I don't know it hurts, just hurt"  Heat- helpful  Ibuprofen helpful- 600 mg   Review of Systems  Constitutional: Negative for fever, chills, diaphoresis and fatigue.  Gastrointestinal: Negative for nausea, vomiting, diarrhea and constipation.  Musculoskeletal: Positive for myalgias and neck pain. Negative for back pain, arthralgias and neck stiffness.  Skin: Negative for rash.  Neurological: Negative for dizziness, weakness, numbness and headaches.       Denies losing bowel/bladder function, saddle anesthesia, or weakness of extremities.       Objective:   Physical Exam  Constitutional: She is oriented to person, place, and time. She appears well-developed and well-nourished. No distress.  BP 138/78 mmHg  Pulse 96  Temp(Src) 98.5 F (36.9 C) (Oral)  Resp 14  Ht 5\' 2"  (1.575 m)  Wt 150 lb (68.04 kg)  BMI 27.43 kg/m2  SpO2 97%  LMP 04/28/1992   HENT:  Head: Normocephalic and atraumatic.  Right Ear: External ear normal.  Left Ear: External ear normal.  Cardiovascular: Normal rate, regular rhythm, normal heart sounds and intact distal pulses.  Exam reveals no gallop and no friction rub.   No murmur heard. Pulmonary/Chest: Effort normal and breath sounds normal. No respiratory distress. She has no wheezes. She has no rales. She exhibits no tenderness.  Musculoskeletal: Normal range of motion. She exhibits tenderness. She exhibits no edema.  Trapezius muscles are tender to palpation and hard bilaterally    Neurological:  She is alert and oriented to person, place, and time. No cranial nerve deficit. She exhibits normal muscle tone. Coordination normal.  Deltoid 5/5 Bilateral, Biceps 5/5 bilateral, Wrist extensors 5/5 bilateral, Triceps 5/5 bilateral, finger flexors and abductors 5/5 bilateral, grip 5/5 bilateraly no Hoffman's, intact heel/toe/sequential walking, sensation intact upper and lower extremities. DTR's upper and lower 2+  Skin: Skin is warm and dry. No rash noted. She is not diaphoretic.  Psychiatric: She has a normal mood and affect. Her behavior is normal. Judgment and thought content normal.       Assessment & Plan:

## 2014-07-27 ENCOUNTER — Encounter: Payer: Self-pay | Admitting: Internal Medicine

## 2014-07-27 ENCOUNTER — Ambulatory Visit (INDEPENDENT_AMBULATORY_CARE_PROVIDER_SITE_OTHER): Payer: Medicare Other | Admitting: Internal Medicine

## 2014-07-27 VITALS — BP 134/74 | HR 71 | Temp 98.5°F | Ht 62.0 in | Wt 153.1 lb

## 2014-07-27 DIAGNOSIS — F439 Reaction to severe stress, unspecified: Secondary | ICD-10-CM

## 2014-07-27 DIAGNOSIS — R739 Hyperglycemia, unspecified: Secondary | ICD-10-CM

## 2014-07-27 DIAGNOSIS — M542 Cervicalgia: Secondary | ICD-10-CM

## 2014-07-27 DIAGNOSIS — I10 Essential (primary) hypertension: Secondary | ICD-10-CM | POA: Diagnosis not present

## 2014-07-27 DIAGNOSIS — E7800 Pure hypercholesterolemia, unspecified: Secondary | ICD-10-CM

## 2014-07-27 DIAGNOSIS — Z0000 Encounter for general adult medical examination without abnormal findings: Secondary | ICD-10-CM

## 2014-07-27 DIAGNOSIS — Z658 Other specified problems related to psychosocial circumstances: Secondary | ICD-10-CM

## 2014-07-27 LAB — LIPID PANEL
CHOLESTEROL: 196 mg/dL (ref 0–200)
HDL: 55.4 mg/dL (ref >39.00)
LDL CALC: 121 mg/dL — AB (ref 0–99)
NonHDL: 140.6
TRIGLYCERIDES: 98 mg/dL (ref 0.0–149.0)
Total CHOL/HDL Ratio: 4
VLDL: 19.6 mg/dL (ref 0.0–40.0)

## 2014-07-27 LAB — BASIC METABOLIC PANEL
BUN: 21 mg/dL (ref 6–23)
CALCIUM: 9.7 mg/dL (ref 8.4–10.5)
CHLORIDE: 104 meq/L (ref 96–112)
CO2: 30 meq/L (ref 19–32)
CREATININE: 0.82 mg/dL (ref 0.40–1.20)
GFR: 72.95 mL/min (ref >60.00)
Glucose, Bld: 77 mg/dL (ref 70–99)
Potassium: 4.1 mEq/L (ref 3.5–5.1)
Sodium: 138 mEq/L (ref 135–145)

## 2014-07-27 LAB — HEPATIC FUNCTION PANEL
ALBUMIN: 4 g/dL (ref 3.5–5.2)
ALK PHOS: 73 U/L (ref 39–117)
ALT: 33 U/L (ref 0–35)
AST: 27 U/L (ref 0–37)
BILIRUBIN TOTAL: 0.5 mg/dL (ref 0.2–1.2)
Bilirubin, Direct: 0.1 mg/dL (ref 0.0–0.3)
Total Protein: 7 g/dL (ref 6.0–8.3)

## 2014-07-27 LAB — HEMOGLOBIN A1C: HEMOGLOBIN A1C: 6.1 % (ref 4.6–6.5)

## 2014-07-27 NOTE — Progress Notes (Signed)
Pre visit review using our clinic review tool, if applicable. No additional management support is needed unless otherwise documented below in the visit note. 

## 2014-07-28 ENCOUNTER — Encounter: Payer: Self-pay | Admitting: *Deleted

## 2014-08-01 DIAGNOSIS — M542 Cervicalgia: Secondary | ICD-10-CM | POA: Insufficient documentation

## 2014-08-01 NOTE — Assessment & Plan Note (Signed)
X-ray of neck and will try prednisone taper. Seeing Dr. Nicki Reaper next week asked her to let her know if the taper is not helpful.

## 2014-08-06 ENCOUNTER — Encounter: Payer: Self-pay | Admitting: Internal Medicine

## 2014-08-06 DIAGNOSIS — Z0000 Encounter for general adult medical examination without abnormal findings: Secondary | ICD-10-CM | POA: Insufficient documentation

## 2014-08-06 NOTE — Assessment & Plan Note (Signed)
Low carb diet and exercise.  Follow met b and a1c.   

## 2014-08-06 NOTE — Assessment & Plan Note (Signed)
Blood pressure has been under good control.  Have her spot check her pressure.  Check metabolic panel.

## 2014-08-06 NOTE — Progress Notes (Signed)
[name], female   DOB: [birth year], 74 y.o.   MRN: [id number]   Subjective:    [name], female    DOB: 06-Jul-1940, 74 y.o.   MRN: [id number]  HPI  Patient here for a scheduled follow up.  Was seen for neck pain - 07/21/14.  See Morey Hummingbird Doss's note for details.  Was given medrol dose pack.  Neck is better.  Xray revealed straightening of c-spine curvature and degenerative changes.  She is trying to stay active.  States her blood pressure is doing well.  Blood pressures averaging 130s.  Not taking the blood pressure medication on a regular basis.  May average taking 2x/week.  No cardiac symptoms with increased activity or exertion.  Breathing stable.     Past Medical History  Diagnosis Date  . Hypertension   . Hypercholesterolemia   . Allergy   . Carpal tunnel syndrome   . GERD (gastroesophageal reflux disease)     Review of Systems  Constitutional: Negative for appetite change and unexpected weight change.  HENT: Negative for congestion and sinus pressure.   Respiratory: Negative for cough, chest tightness and shortness of breath.   Cardiovascular: Negative for chest pain, palpitations and leg swelling.  Gastrointestinal: Negative for nausea, vomiting, abdominal pain and diarrhea.  Genitourinary: Negative for dysuria and difficulty urinating.  Musculoskeletal: Positive for neck pain (improved.  increased rom now.  ).  Skin: Negative for color change and rash.  Neurological: Negative for dizziness, light-headedness and headaches.       Objective:    Physical Exam  Constitutional: She appears well-developed and well-nourished. No distress.  HENT:  Nose: Nose normal.  Mouth/Throat: Oropharynx is clear and moist.  Neck: Neck supple. No thyromegaly present.  Cardiovascular: Normal rate and regular rhythm.   Pulmonary/Chest: Breath sounds normal. No respiratory distress. She has no wheezes.  Abdominal: Soft. Bowel sounds are normal. There is no tenderness.    Musculoskeletal: She exhibits no edema or tenderness.  Increased rom without significant pain.    Lymphadenopathy:    She has no cervical adenopathy.    BP 134/74 mmHg  Pulse 71  Temp(Src) 98.5 F (36.9 C) (Oral)  Ht 5' 2"  (1.575 m)  Wt 153 lb 2 oz (69.457 kg)  BMI 28.00 kg/m2  SpO2 96%  LMP 04/28/1992 Wt Readings from Last 3 Encounters:  07/27/14 153 lb 2 oz (69.457 kg)  07/21/14 150 lb (68.04 kg)  02/15/14 159 lb 8 oz (72.349 kg)     Lab Results  Component Value Date   WBC 8.3 10/20/2013   HGB 13.3 10/20/2013   HCT 39.7 10/20/2013   PLT 247.0 10/20/2013   GLUCOSE 77 07/27/2014   CHOL 196 07/27/2014   TRIG 98.0 07/27/2014   HDL 55.40 07/27/2014   LDLCALC 121* 07/27/2014   ALT 33 07/27/2014   AST 27 07/27/2014   NA 138 07/27/2014   K 4.1 07/27/2014   CL 104 07/27/2014   CREATININE 0.82 07/27/2014   BUN 21 07/27/2014   CO2 30 07/27/2014   TSH 1.17 10/20/2013   HGBA1C 6.1 07/27/2014    Dg Cervical Spine Complete  07/21/2014   CLINICAL DATA:  Neck pain for 1 week initial evaluation, cervical spine pain radiating into clavicle, no trauma  EXAM: CERVICAL SPINE  4+ VIEWS  COMPARISON:  None.  FINDINGS: Mild left carotid calcification. Normal anterior-posterior alignment. Mild straightening of the central cervical spine. No prevertebral soft tissue swelling. Mild C4-5, moderate C5-6, and  mild to moderate C6-7 degenerative disc disease. No significant foraminal narrowing.  IMPRESSION: Multilevel degenerative disc disease   Electronically Signed   By: Skipper Cliche M.D.   On: 07/21/2014 15:53       Assessment & Plan:   Problem List Items Addressed This Visit    Cervicalgia    Neck pain improved.  Increased rom.  Follow.        Health care maintenance    Physical 10/14/13.  Mammogram 05/11/14 - Birads I.  Colonoscopy 02/17/06 - normal.  Recommend f/u colonoscopy in 2017.         Hypercholesterolemia    Low cholesterol diet and exercise.  Follow lipid panel.         Relevant Orders   Lipid panel (Completed)   Hepatic function panel (Completed)   Hyperglycemia    Low carb diet and exercise.  Follow met b and a1c.        Relevant Orders   Hemoglobin A1c (Completed)   Hypertension - Primary    Blood pressure has been under good control.  Have her spot check her pressure.  Check metabolic panel.        Relevant Orders   Basic metabolic panel (Completed)   Stress    Feels she is handling stress well.  Follow.            [name], MD

## 2014-08-06 NOTE — Assessment & Plan Note (Signed)
Low cholesterol diet and exercise.  Follow lipid panel.   

## 2014-08-06 NOTE — Assessment & Plan Note (Signed)
Feels she is handling stress well.  Follow.

## 2014-08-06 NOTE — Assessment & Plan Note (Signed)
Neck pain improved.  Increased rom.  Follow.

## 2014-08-06 NOTE — Assessment & Plan Note (Signed)
Physical 10/14/13.  Mammogram 05/11/14 - Birads I.  Colonoscopy 02/17/06 - normal.  Recommend f/u colonoscopy in 2017.

## 2014-09-07 ENCOUNTER — Ambulatory Visit (INDEPENDENT_AMBULATORY_CARE_PROVIDER_SITE_OTHER): Payer: Medicare Other | Admitting: Nurse Practitioner

## 2014-09-07 ENCOUNTER — Encounter: Payer: Self-pay | Admitting: Nurse Practitioner

## 2014-09-07 VITALS — BP 128/62 | HR 91 | Temp 97.4°F | Resp 12 | Ht 62.0 in | Wt 150.0 lb

## 2014-09-07 DIAGNOSIS — M545 Low back pain: Secondary | ICD-10-CM | POA: Diagnosis not present

## 2014-09-07 MED ORDER — PREDNISONE 10 MG PO TABS: ORAL_TABLET | ORAL | Status: DC

## 2014-09-07 NOTE — Progress Notes (Signed)
Pre visit review using our clinic review tool, if applicable. No additional management support is needed unless otherwise documented below in the visit note. 

## 2014-09-07 NOTE — Patient Instructions (Signed)
Let us know if not helpful!

## 2014-09-07 NOTE — Progress Notes (Signed)
   Subjective:    Patient ID: [name], female    DOB: September 20, 1940, 74 y.o.   MRN: [id number]  HPI  Ms. Deshmukh is a 74 yo female with a CC of back pain.   1)  [facility] day celebration, sat in a chair (fold out kind) sat for 1 hour  Ice and heat, bed is high, had to lay up and pull her self onto the bed Achy thighs, felt in her "haunches".    Ice, heating pad, Aleve, ibuprofen   Review of Systems  Constitutional: Negative for fever, chills, diaphoresis and fatigue.  Eyes: Negative for visual disturbance.  Musculoskeletal: Positive for myalgias and back pain.  Skin: Negative for rash.  Neurological: Negative for dizziness, weakness and numbness.      Objective:   Physical Exam  Constitutional: She is oriented to person, place, and time. She appears well-developed and well-nourished. No distress.  BP 128/62 mmHg  Pulse 91  Temp(Src) 97.4 F (36.3 C)  Resp 12  Ht 5\' 2"  (1.575 m)  Wt 150 lb (68.04 kg)  BMI 27.43 kg/m2  SpO2 96%  LMP 04/28/1992   HENT:  Head: Normocephalic and atraumatic.  Right Ear: External ear normal.  Left Ear: External ear normal.  Cardiovascular: Normal rate and regular rhythm.  Exam reveals no gallop and no friction rub.   No murmur heard. Pulmonary/Chest: Effort normal and breath sounds normal. No respiratory distress. She has no wheezes. She has no rales. She exhibits no tenderness.  Musculoskeletal: She exhibits tenderness. She exhibits no edema.  Bilateral low back tenderness paraspinally   Neurological: She is alert and oriented to person, place, and time. No cranial nerve deficit. She exhibits normal muscle tone. Coordination normal.  Skin: Skin is warm and dry. No rash noted. She is not diaphoretic.  Psychiatric: She has a normal mood and affect. Her behavior is normal. Judgment and thought content normal.      Assessment & Plan:

## 2014-09-23 NOTE — Assessment & Plan Note (Signed)
Will repeat prednisone taper for back. She states it was helpful in the past for neck pain. FU prn worsening/failure to improve.

## 2014-10-31 ENCOUNTER — Ambulatory Visit: Payer: Federal, State, Local not specified - PPO | Admitting: Nurse Practitioner

## 2014-11-01 ENCOUNTER — Ambulatory Visit (INDEPENDENT_AMBULATORY_CARE_PROVIDER_SITE_OTHER): Payer: Medicare Other | Admitting: Nurse Practitioner

## 2014-11-01 ENCOUNTER — Encounter (INDEPENDENT_AMBULATORY_CARE_PROVIDER_SITE_OTHER): Payer: Self-pay

## 2014-11-01 ENCOUNTER — Encounter: Payer: Self-pay | Admitting: Nurse Practitioner

## 2014-11-01 ENCOUNTER — Ambulatory Visit (INDEPENDENT_AMBULATORY_CARE_PROVIDER_SITE_OTHER)
Admission: RE | Admit: 2014-11-01 | Discharge: 2014-11-01 | Disposition: A | Discharge status: Home / Self Care | Payer: Medicare Other | Source: Ambulatory Visit | Attending: Nurse Practitioner | Admitting: Nurse Practitioner

## 2014-11-01 VITALS — BP 136/84 | HR 78 | Temp 98.5°F | Resp 14 | Ht 62.0 in | Wt 150.2 lb

## 2014-11-01 DIAGNOSIS — R058 Other specified cough: Secondary | ICD-10-CM

## 2014-11-01 DIAGNOSIS — R05 Cough: Secondary | ICD-10-CM | POA: Diagnosis not present

## 2014-11-01 NOTE — Progress Notes (Signed)
Pre visit review using our clinic review tool, if applicable. No additional management support is needed unless otherwise documented below in the visit note. 

## 2014-11-01 NOTE — Progress Notes (Signed)
   Subjective:    [name], female    DOB: [birth year], 74 y.o.   MRN: [id number]  HPI  Donna Lara is a 74 yo female with a CC of cough x 6 months.   1) Pt reports recent tightness in the chest, intermittent, has home stress due to husband not feeling well. Non-productive, feels tight- she feels it may be due to stress. Laying down gets a cough. Donna Lara recently diagnosed with asthma.   Review of Systems  Constitutional: Negative for fever, chills, diaphoresis and fatigue.  Respiratory: Positive for cough and chest tightness. Negative for shortness of breath and wheezing.   Cardiovascular: Negative for chest pain, palpitations and leg swelling.  Gastrointestinal: Negative for nausea, vomiting and diarrhea.  Skin: Negative for rash.  Neurological: Negative for dizziness, weakness, numbness and headaches.  Psychiatric/Behavioral: The patient is not nervous/anxious.       Objective:   Physical Exam  Constitutional: She is oriented to person, place, and time. She appears well-developed and well-nourished. No distress.  BP 136/84 mmHg  Pulse 78  Temp(Src) 98.5 F (36.9 C) (Oral)  Resp 14  Ht 5\' 2"  (1.575 m)  Wt 150 lb 3.2 oz (68.13 kg)  BMI 27.46 kg/m2  SpO2 97%  LMP 04/28/1992   HENT:  Head: Normocephalic and atraumatic.  Right Ear: External ear normal.  Left Ear: External ear normal.  Cardiovascular: Normal rate, regular rhythm and normal heart sounds.  Exam reveals no gallop and no friction rub.   No murmur heard. Pulmonary/Chest: Effort normal and breath sounds normal. No respiratory distress. She has no wheezes. She has no rales. She exhibits no tenderness.  Neurological: She is alert and oriented to person, place, and time. No cranial nerve deficit. She exhibits normal muscle tone. Coordination normal.  Skin: Skin is warm and dry. No rash noted. She is not diaphoretic.  Psychiatric: She has a normal mood and affect. Her behavior is normal. Judgment and thought  content normal.      Assessment & Plan:

## 2014-11-01 NOTE — Assessment & Plan Note (Signed)
Since this has been ongoing for 6 months will obtain chest x-ray. Discussed possible etiologies including allergies, pndrip, stress, and reflux. Pt is using essential oils and prefers natural treatments. Will follow up after results.

## 2014-11-01 NOTE — Patient Instructions (Signed)
We will contact you about your results from the chest x-ray.

## 2014-11-02 ENCOUNTER — Ambulatory Visit (INDEPENDENT_AMBULATORY_CARE_PROVIDER_SITE_OTHER): Payer: Medicare Other

## 2014-11-02 VITALS — BP 138/82 | HR 78 | Temp 98.2°F | Resp 14 | Ht 62.0 in | Wt 149.6 lb

## 2014-11-02 DIAGNOSIS — Z0000 Encounter for general adult medical examination without abnormal findings: Secondary | ICD-10-CM | POA: Diagnosis not present

## 2014-11-02 DIAGNOSIS — Z13820 Encounter for screening for osteoporosis: Secondary | ICD-10-CM

## 2014-11-02 NOTE — Patient Instructions (Addendum)
Donna Lara,  Thank you for taking time to come for your Medicare Wellness Visit.  I appreciate your ongoing commitment to your health goals. Please review the following plan we discussed and let me know if I can assist you in the future.   Follow up with TDAP and Shingles vaccine  At pharmacy; results from previous Dexa Scan; pneumonia vaccine and flu shot at next follow up visit.   Bone Densitometry Bone densitometry is a special X-ray that measures your bone density and can be used to help predict your risk of bone fractures. This test is used to determine bone mineral content and density to diagnose osteoporosis. Osteoporosis is the loss of bone that may cause the bone to become weak. Osteoporosis commonly occurs in women entering menopause. However, it may be found in men and in people with other diseases. PREPARATION FOR TEST No preparation necessary. WHO SHOULD BE TESTED?  All women older than 74.  Postmenopausal women (50 to 65) with risk factors for osteoporosis.  People with a previous fracture caused by normal activities.  People with a small body frame (less than 127 poundsor a body mass index [BMI] of less than 21).  People who have a parent with a hip fracture or history of osteoporosis.  People who smoke.  People who have rheumatoid arthritis.  Anyone who engages in excessive alcohol use (more than 3 drinks most days).  Women who experience early menopause. WHEN SHOULD YOU BE RETESTED? Current guidelines suggest that you should wait at least 2 years before doing a bone density test again if your first test was normal.Recent studies indicated that women with normal bone density may be able to wait a few years before needing to repeat a bone density test. You should discuss this with your caregiver.  NORMAL FINDINGS   Normal: less than standard deviation below normal (greater than -1).  Osteopenia: 1 to 2.5 standard deviations below normal (-1 to -2.5).  Osteoporosis:  greater than 2.5 standard deviations below normal (less than -2.5). Test results are reported as a "T score" and a "Z score."The T score is a number that compares your bone density with the bone density of healthy, young women.The Z score is a number that compares your bone density with the scores of women who are the same age, gender, and race.  Ranges for normal findings may vary among different laboratories and hospitals. You should always check with your doctor after having lab work or other tests done to discuss the meaning of your test results and whether your values are considered within normal limits. MEANING OF TEST  Your caregiver will go over the test results with you and discuss the importance and meaning of your results, as well as treatment options and the need for additional tests if necessary. OBTAINING THE TEST RESULTS It is your responsibility to obtain your test results. Ask the lab or department performing the test when and how you will get your results. Document Released: 04/15/2004 Document Revised: 06/16/2011 Document Reviewed: 05/08/2010 [facility] Patient Information 2015 [place], [place]. This information is not intended to replace advice given to you by your health care provider. Make sure you discuss any questions you have with your health care provider.  Fat and Cholesterol Control Diet Fat and cholesterol levels in your blood and organs are influenced by your diet. High levels of fat and cholesterol may lead to diseases of the heart, small and large blood vessels, gallbladder, liver, and pancreas. CONTROLLING FAT AND CHOLESTEROL WITH  DIET Although exercise and lifestyle factors are important, your diet is key. That is because certain foods are known to raise cholesterol and others to lower it. The goal is to balance foods for their effect on cholesterol and more importantly, to replace saturated and trans fat with other types of fat, such as monounsaturated fat,  polyunsaturated fat, and omega-3 fatty acids. On average, a person should consume no more than 15 to 17 g of saturated fat daily. Saturated and trans fats are considered "bad" fats, and they will raise LDL cholesterol. Saturated fats are primarily found in animal products such as meats, butter, and cream. However, that does not mean you need to give up all your favorite foods. Today, there are good tasting, low-fat, low-cholesterol substitutes for most of the things you like to eat. Choose low-fat or nonfat alternatives. Choose round or loin cuts of red meat. These types of cuts are lowest in fat and cholesterol. Chicken (without the skin), fish, veal, and ground [place] breast are great choices. Eliminate fatty meats, such as hot dogs and salami. Even shellfish have little or no saturated fat. Have a 3 oz (85 g) portion when you eat lean meat, poultry, or fish. Trans fats are also called "partially hydrogenated oils." They are oils that have been scientifically manipulated so that they are solid at room temperature resulting in a longer shelf life and improved taste and texture of foods in which they are added. Trans fats are found in stick margarine, some tub margarines, cookies, crackers, and baked goods.  When baking and cooking, oils are a great substitute for butter. The monounsaturated oils are especially beneficial since it is believed they lower LDL and raise HDL. The oils you should avoid entirely are saturated tropical oils, such as coconut and palm.  Remember to eat a lot from food groups that are naturally free of saturated and trans fat, including fish, fruit, vegetables, beans, grains (barley, rice, couscous, bulgur wheat), and pasta (without cream sauces).  IDENTIFYING FOODS THAT LOWER FAT AND CHOLESTEROL  Soluble fiber may lower your cholesterol. This type of fiber is found in fruits such as apples, vegetables such as broccoli, potatoes, and carrots, legumes such as beans, peas, and lentils,  and grains such as barley. Foods fortified with plant sterols (phytosterol) may also lower cholesterol. You should eat at least 2 g per day of these foods for a cholesterol lowering effect.  Read package labels to identify low-saturated fats, trans fat free, and low-fat foods at the supermarket. Select cheeses that have only 2 to 3 g saturated fat per ounce. Use a heart-healthy tub margarine that is free of trans fats or partially hydrogenated oil. When buying baked goods (cookies, crackers), avoid partially hydrogenated oils. Breads and muffins should be made from whole grains (whole-wheat or whole oat flour, instead of "flour" or "enriched flour"). Buy non-creamy canned soups with reduced salt and no added fats.  FOOD PREPARATION TECHNIQUES  Never deep-fry. If you must fry, either stir-fry, which uses very little fat, or use non-stick cooking sprays. When possible, broil, bake, or roast meats, and steam vegetables. Instead of putting butter or margarine on vegetables, use lemon and herbs, applesauce, and cinnamon (for squash and sweet potatoes). Use nonfat yogurt, salsa, and low-fat dressings for salads.  LOW-SATURATED FAT / LOW-FAT FOOD SUBSTITUTES Meats / Saturated Fat (g)  Avoid: Steak, marbled (3 oz/85 g) / 11 g  Choose: Steak, lean (3 oz/85 g) / 4 g  Avoid: Hamburger (3 oz/85 g) /  7 g  Choose: Hamburger, lean (3 oz/85 g) / 5 g  Avoid: Ham (3 oz/85 g) / 6 g  Choose: Ham, lean cut (3 oz/85 g) / 2.4 g  Avoid: Chicken, with skin, dark meat (3 oz/85 g) / 4 g  Choose: Chicken, skin removed, dark meat (3 oz/85 g) / 2 g  Avoid: Chicken, with skin, light meat (3 oz/85 g) / 2.5 g  Choose: Chicken, skin removed, light meat (3 oz/85 g) / 1 g Dairy / Saturated Fat (g)  Avoid: Whole milk (1 cup) / 5 g  Choose: Low-fat milk, 2% (1 cup) / 3 g  Choose: Low-fat milk, 1% (1 cup) / 1.5 g  Choose: Skim milk (1 cup) / 0.3 g  Avoid: Hard cheese (1 oz/28 g) / 6 g  Choose: Skim milk cheese (1  oz/28 g) / 2 to 3 g  Avoid: Cottage cheese, 4% fat (1 cup) / 6.5 g  Choose: Low-fat cottage cheese, 1% fat (1 cup) / 1.5 g  Avoid: Ice cream (1 cup) / 9 g  Choose: Sherbet (1 cup) / 2.5 g  Choose: Nonfat frozen yogurt (1 cup) / 0.3 g  Choose: Frozen fruit bar / trace  Avoid: Whipped cream (1 tbs) / 3.5 g  Choose: Nondairy whipped topping (1 tbs) / 1 g Condiments / Saturated Fat (g)  Avoid: Mayonnaise (1 tbs) / 2 g  Choose: Low-fat mayonnaise (1 tbs) / 1 g  Avoid: Butter (1 tbs) / 7 g  Choose: Extra light margarine (1 tbs) / 1 g  Avoid: Coconut oil (1 tbs) / 11.8 g  Choose: Olive oil (1 tbs) / 1.8 g  Choose: Corn oil (1 tbs) / 1.7 g  Choose: Safflower oil (1 tbs) / 1.2 g  Choose: Sunflower oil (1 tbs) / 1.4 g  Choose: Soybean oil (1 tbs) / 2.4 g  Choose: Canola oil (1 tbs) / 1 g Document Released: 03/24/2005 Document Revised: 07/19/2012 Document Reviewed: 06/22/2013 [employer] Patient Information 2015 [place], [place]. This information is not intended to replace advice given to you by your health care provider. Make sure you discuss any questions you have with your health care provider.  Hearing Loss A hearing loss is sometimes called deafness. Hearing loss may be partial or total. CAUSES Hearing loss may be caused by:  Wax in the ear canal.  Infection of the ear canal.  Infection of the middle ear.  Trauma to the ear or surrounding area.  Fluid in the middle ear.  A hole in the eardrum (perforated eardrum).  Exposure to loud sounds or music.  Problems with the hearing nerve.  Certain medications. Hearing loss without wax, infection, or a history of injury may mean that the nerve is involved. Hearing loss with severe dizziness, nausea and vomiting or ringing in the ear may suggest a hearing nerve irritation or problems in the middle or inner ear. If hearing loss is untreated, there is a greater likelihood for residual or permanent hearing  loss. DIAGNOSIS A hearing test (audiometry) assesses hearing loss. The audiometry test needs to be performed by a hearing specialist (audiologist). TREATMENT Treatment for recent onset of hearing loss may include:  Ear wax removal.  Medications that kill germs (antibiotics).  Cortisone medications.  Prompt follow up with the appropriate specialist. Return of hearing depends on the cause of your hearing loss, so proper medical follow-up is important. Some hearing loss may not be reversible, and a caregiver should discuss care and treatment options with  you. SEEK MEDICAL CARE IF:   You have a severe headache, dizziness, or changes in vision.  You have new or increased weakness.  You develop repeated vomiting or other serious medical problems.  You have a fever. Document Released: 03/24/2005 Document Revised: 06/16/2011 Document Reviewed: 07/19/2009 [facility] Patient Information 2015 [place], [place]. This information is not intended to replace advice given to you by your health care provider. Make sure you discuss any questions you have with your health care provider.

## 2014-11-02 NOTE — Progress Notes (Signed)
Reviewed

## 2014-11-02 NOTE — Progress Notes (Signed)
Subjective:   Donna Lara is a 74 y.o. female who presents for Medicare Annual (Subsequent) preventive examination.  Review of Systems:  No ROS.  Medicare Wellness Visit.  Cardiac Risk Factors include: advanced age (>62men, >66 women);sedentary lifestyle     Objective:    The goal of the wellness visit is to assist the patient how to close the gaps in care and create a preventative care plan for the patient.   Vitals: BP 138/82 mmHg  Pulse 78  Temp(Src) 98.2 F (36.8 C) (Oral)  Resp 14  Ht 5\' 2"  (1.575 m)  Wt 149 lb 9.6 oz (67.858 kg)  BMI 27.36 kg/m2  SpO2 98%  LMP 04/28/1992  Tobacco History  Smoking status  . Never Smoker   Smokeless tobacco  . Never Used     Counseling given: Not Answered   Past Medical History  Diagnosis Date  . Hypertension   . Hypercholesterolemia   . Allergy   . GERD (gastroesophageal reflux disease)   . Carpal tunnel syndrome 1999   Past Surgical History  Procedure Laterality Date  . Appendectomy    . Tonsillectomy    . Eye surgery      left  . Carpal tunnel release      right hand   Family History  Problem Relation Age of Onset  . Diabetes Mother   . Hypertension Brother   . Diabetes Maternal Grandmother   . Breast cancer Neg Hx   . Colon cancer Neg Hx    History  Sexual Activity  . Sexual Activity: No    Outpatient Encounter Prescriptions as of 11/02/2014  Medication Sig  . aspirin (ASPIR-81) 81 MG EC tablet Take 81 mg by mouth daily. Swallow whole.  . Cholecalciferol (VITAMIN D3) 1000 UNITS CAPS Take by mouth 2 (two) times daily.   . clonazePAM (KLONOPIN) 0.5 MG tablet Take 1 tablet (0.5 mg total) by mouth daily as needed.  . CYANOCOBALAMIN PO Take by mouth.  . hydrochlorothiazide (HYDRODIURIL) 12.5 MG tablet Take 12.5 mg by mouth as needed.   Marland Kitchen LECITHIN PO Take 800 mg by mouth daily.  . meloxicam (MOBIC) 15 MG tablet Take 1 tablet (15 mg total) by mouth daily. (Patient taking differently: Take 15 mg by mouth daily  as needed. )  . Omega-3 Fatty Acids (FISH OIL) 1000 MG CAPS Take by mouth 3 (three) times daily.  Marland Kitchen OVER THE COUNTER MEDICATION Hair supplement  . polyethylene glycol (MIRALAX / GLYCOLAX) packet Take 17 g by mouth daily.  . vitamin C (ASCORBIC ACID) 500 MG tablet Take 500 mg by mouth 2 (two) times daily.   No facility-administered encounter medications on file as of 11/02/2014.    Activities of Daily Living In your present state of health, do you have any difficulty performing the following activities: 11/02/2014  Hearing? N  Vision? N  Difficulty concentrating or making decisions? N  Walking or climbing stairs? N  Dressing or bathing? N  Doing errands, shopping? N  Preparing Food and eating ? N  Using the Toilet? N  In the past six months, have you accidently leaked urine? N  Do you have problems with loss of bowel control? N  Managing your Medications? N  Managing your Finances? N  Housekeeping or managing your Housekeeping? N    Patient Care Team: [name], MD as PCP - General (Internal Medicine)    Assessment:    Calcium and Vit D as appropriate/ Osteoporosis risk reviewed. Educated on the  need for Dexa Scan.  Taking meds without issues; no barriers identified.  No Risk for hepatitis or high risk social behavior identified via hepatitis screen.  Educated on Zostavax and TDAP. Follow up with pharmacy and insurance company for co-pays or charges.  Vaccines postponed per patient request.  Safety issues reviewed-No firearms in the home.  No smoke detectors in the home. Educated on the need to use smoke detectors.  No violence in the home.  Wears seatbelts when driving or riding with others.  The patient was oriented x 3; appropriate in dress and manner and no objective failures at ADL's or IADL's.   Functional status reviewed; no losses in function x 1 year  End of life planning was discussed; aging in home or other; plans to complete HCPOA were discussed.    Exercise Activities and Dietary recommendations Current Exercise Habits:: The patient does not participate in regular exercise at present  Goals    . Increase physical activity     Tone body by lifting weights, walking, and using the exercise machine ([employer] Track) at home for a minimum of 3x a week for 30 minute sessions.      Fall Risk Fall Risk  11/02/2014 07/21/2014 10/16/2012 05/03/2012  Falls in the past year? No No No No   Depression Screen PHQ 2/9 Scores 11/02/2014 07/21/2014 10/16/2012 05/03/2012  PHQ - 2 Score 0 0 0 0     Cognitive Testing MMSE - Mini Mental State Exam 11/02/2014  Orientation to time 5  Orientation to Place 5  Registration 3  Attention/ Calculation 5  Recall 3  Language- name 2 objects 2  Language- repeat 1  Language- follow 3 step command 3  Language- read & follow direction 1  Write a sentence 1  Copy design 1  Total score 30     There is no immunization history on file for this patient. Screening Tests Health Maintenance  Topic Date Due  . DEXA SCAN  02/02/2015 (Originally 02/11/2008)  . ZOSTAVAX  02/02/2015 (Originally 02/11/2003)  . TETANUS/TDAP  02/02/2015 (Originally 02/10/1962)  . INFLUENZA VACCINE  02/16/2015 (Originally 11/06/2014)  . PNA vac Low Risk Adult (1 of 2 - PCV13) 02/20/2015 (Originally 02/11/2008)  . COLONOSCOPY  02/18/2016  . MAMMOGRAM  05/11/2016      Plan:   During the course of the visit the patient was educated and counseled about the following appropriate screening and preventive services:   Pneumoccal and Influenza due at 3 mo follow up.    Vaccines to include Pneumoccal, Influenza, Hepatitis B, Td, Zostavax, HCV  Electrocardiogram  Cardiovascular Disease  Colorectal cancer screening  Bone density screening-Referral placed  Diabetes screening  Glaucoma screening  Mammography/PAP  Nutrition counseling   Patient Instructions (the written plan) was given to the patient.   Varney Biles,  LPN  [id number]

## 2014-11-08 ENCOUNTER — Ambulatory Visit: Payer: Federal, State, Local not specified - PPO

## 2014-11-20 ENCOUNTER — Telehealth: Payer: Self-pay | Admitting: *Deleted

## 2014-11-20 MED ORDER — CLONAZEPAM 0.5 MG PO TABS: 0.5000 mg | ORAL_TABLET | Freq: Every day | ORAL | Status: DC | PRN

## 2014-11-20 NOTE — Telephone Encounter (Signed)
Fax from pharmacy requesting Clonazepam 0.5mg .  Last OV 7.26.16, last refill 4.6.16.  Please advise refill

## 2014-11-20 NOTE — Telephone Encounter (Signed)
Refilled clonazepam #30 with one refill.  rx on your desk.

## 2014-11-20 NOTE — Telephone Encounter (Signed)
rx faxed

## 2014-11-23 ENCOUNTER — Ambulatory Visit: Payer: Federal, State, Local not specified - PPO | Attending: Internal Medicine

## 2014-12-14 ENCOUNTER — Ambulatory Visit
Admission: RE | Admit: 2014-12-14 | Discharge: 2014-12-14 | Disposition: A | Discharge status: Home / Self Care | Payer: Medicare Other | Source: Ambulatory Visit | Attending: Internal Medicine | Admitting: Internal Medicine

## 2014-12-14 DIAGNOSIS — Z780 Asymptomatic menopausal state: Secondary | ICD-10-CM | POA: Insufficient documentation

## 2014-12-14 DIAGNOSIS — M859 Disorder of bone density and structure, unspecified: Secondary | ICD-10-CM | POA: Diagnosis not present

## 2014-12-14 DIAGNOSIS — Z13820 Encounter for screening for osteoporosis: Secondary | ICD-10-CM | POA: Insufficient documentation

## 2014-12-29 ENCOUNTER — Ambulatory Visit (INDEPENDENT_AMBULATORY_CARE_PROVIDER_SITE_OTHER): Payer: Medicare Other | Admitting: Nurse Practitioner

## 2014-12-29 ENCOUNTER — Encounter: Payer: Self-pay | Admitting: Nurse Practitioner

## 2014-12-29 VITALS — BP 168/90 | HR 77 | Temp 98.2°F | Resp 14 | Ht 62.0 in | Wt 149.8 lb

## 2014-12-29 DIAGNOSIS — H6092 Unspecified otitis externa, left ear: Secondary | ICD-10-CM | POA: Insufficient documentation

## 2014-12-29 MED ORDER — CIPROFLOXACIN-DEXAMETHASONE 0.3-0.1 % OT SUSP: 4.0000 [drp] | Freq: Two times a day (BID) | OTIC | Status: DC

## 2014-12-29 NOTE — Patient Instructions (Addendum)
Please try the drops- steroid and antibiotic.   Call Monday if not helpful

## 2014-12-29 NOTE — Assessment & Plan Note (Signed)
Worsening. Ciprodex sent to pharmacy. Asked pt to call Monday if not improved.

## 2014-12-29 NOTE — Progress Notes (Signed)
[name], female    DOB: February 17, 1941  Age: 74 y.o. MRN: [id number]  CC: Otalgia   HPI Donna Lara presents for CC of left ear pain and facial pain x 2 days.   1) Pruritic ear yesterday, tender, nasal congestion x 7 days. Pt reports trying some ear drops Dr. Tami Ribas gave her for itching ears. Pt reports no sick contacts and facial pain resolved after one day. Pt reports she has a lot to do this weekend and needs this to be gone. Denies discharge or fever.   History Donna Lara has a past medical history of Hypertension; Hypercholesterolemia; Allergy; GERD (gastroesophageal reflux disease); and Carpal tunnel syndrome (1999).   She has past surgical history that includes Appendectomy; Tonsillectomy; Eye surgery; and Carpal tunnel release.   Her family history includes Diabetes in her maternal grandmother and mother; Hypertension in her brother. There is no history of Breast cancer or Colon cancer.She reports that she has never smoked. She has never used smokeless tobacco. She reports that she drinks alcohol. She reports that she does not use illicit drugs.  Outpatient Prescriptions Prior to Visit  Medication Sig Dispense Refill  . aspirin (ASPIR-81) 81 MG EC tablet Take 81 mg by mouth daily. Swallow whole.    . Cholecalciferol (VITAMIN D3) 1000 UNITS CAPS Take by mouth 2 (two) times daily.     . clonazePAM (KLONOPIN) 0.5 MG tablet Take 1 tablet (0.5 mg total) by mouth daily as needed. 30 tablet 1  . CYANOCOBALAMIN PO Take by mouth.    . hydrochlorothiazide (HYDRODIURIL) 12.5 MG tablet Take 12.5 mg by mouth as needed.     Marland Kitchen LECITHIN PO Take 800 mg by mouth daily.    . meloxicam (MOBIC) 15 MG tablet Take 1 tablet (15 mg total) by mouth daily. (Patient taking differently: Take 15 mg by mouth daily as needed. ) 30 tablet 3  . Omega-3 Fatty Acids (FISH OIL) 1000 MG CAPS Take by mouth 3 (three) times daily.    Marland Kitchen OVER THE COUNTER MEDICATION Hair supplement    . polyethylene glycol (MIRALAX /  GLYCOLAX) packet Take 17 g by mouth daily.    . vitamin C (ASCORBIC ACID) 500 MG tablet Take 500 mg by mouth 2 (two) times daily.     No facility-administered medications prior to visit.    ROS Review of Systems  Constitutional: Negative for fever, chills, diaphoresis and fatigue.  HENT: Positive for congestion and ear pain. Negative for ear discharge.   Eyes: Negative for visual disturbance.  Respiratory: Negative for cough.     Objective:  BP 168/90 mmHg  Pulse 77  Temp(Src) 98.2 F (36.8 C)  Resp 14  Ht 5\' 2"  (1.575 m)  Wt 149 lb 12.8 oz (67.949 kg)  BMI 27.39 kg/m2  SpO2 97%  LMP 04/28/1992  Physical Exam  Constitutional: She is oriented to person, place, and time. She appears well-developed and well-nourished. No distress.  HENT:  Head: Normocephalic and atraumatic.  Right Ear: External ear normal.  Left Ear: External ear normal.  Right TM visible landmarks.  Left TM not visible through swollen erythematous canal. External left ear normal. Very tender to insert otoscope  Eyes: Pupils are equal, round, and reactive to light. Right eye exhibits no discharge. Left eye exhibits no discharge. No scleral icterus.  Cardiovascular: Normal rate, regular rhythm and normal heart sounds.  Exam reveals no gallop and no friction rub.   No murmur heard. Pulmonary/Chest: Effort normal and breath sounds  normal. No respiratory distress. She has no wheezes. She has no rales. She exhibits no tenderness.  Neurological: She is alert and oriented to person, place, and time. No cranial nerve deficit. She exhibits normal muscle tone. Coordination normal.  Skin: Skin is warm and dry. No rash noted. She is not diaphoretic.  Psychiatric: She has a normal mood and affect. Her behavior is normal. Judgment and thought content normal.   Assessment & Plan:   [place]nee was seen today for otalgia.  Diagnoses and all orders for this visit:  Otitis externa of left ear  Other orders -      ciprofloxacin-dexamethasone (CIPRODEX) otic suspension; Place 4 drops into the left ear 2 (two) times daily.  I am having Ms. Read start on ciprofloxacin-dexamethasone. I am also having her maintain her CYANOCOBALAMIN PO, hydrochlorothiazide, polyethylene glycol, Vitamin D3, vitamin C, Fish Oil, aspirin, LECITHIN PO, meloxicam, OVER THE COUNTER MEDICATION, and clonazePAM.  Meds ordered this encounter  Medications  . ciprofloxacin-dexamethasone (CIPRODEX) otic suspension    Sig: Place 4 drops into the left ear 2 (two) times daily.    Dispense:  7.5 mL    Refill:  0    Order Specific Question:  Supervising Provider    Answer:  Crecencio Mc [[id number]]     Follow-up: Return if symptoms worsen or fail to improve.

## 2014-12-29 NOTE — Progress Notes (Signed)
Pre visit review using our clinic review tool, if applicable. No additional management support is needed unless otherwise documented below in the visit note. 

## 2015-02-02 ENCOUNTER — Ambulatory Visit (INDEPENDENT_AMBULATORY_CARE_PROVIDER_SITE_OTHER): Payer: Medicare Other | Admitting: Internal Medicine

## 2015-02-02 ENCOUNTER — Encounter: Payer: Self-pay | Admitting: Internal Medicine

## 2015-02-02 VITALS — BP 120/80 | HR 85 | Temp 98.5°F | Resp 18 | Ht 62.0 in | Wt 149.2 lb

## 2015-02-02 DIAGNOSIS — Z9109 Other allergy status, other than to drugs and biological substances: Secondary | ICD-10-CM

## 2015-02-02 DIAGNOSIS — E7800 Pure hypercholesterolemia, unspecified: Secondary | ICD-10-CM

## 2015-02-02 DIAGNOSIS — R739 Hyperglycemia, unspecified: Secondary | ICD-10-CM

## 2015-02-02 DIAGNOSIS — Z91048 Other nonmedicinal substance allergy status: Secondary | ICD-10-CM

## 2015-02-02 DIAGNOSIS — I10 Essential (primary) hypertension: Secondary | ICD-10-CM | POA: Diagnosis not present

## 2015-02-02 DIAGNOSIS — M8580 Other specified disorders of bone density and structure, unspecified site: Secondary | ICD-10-CM | POA: Insufficient documentation

## 2015-02-02 DIAGNOSIS — Z658 Other specified problems related to psychosocial circumstances: Secondary | ICD-10-CM

## 2015-02-02 DIAGNOSIS — F439 Reaction to severe stress, unspecified: Secondary | ICD-10-CM

## 2015-02-02 NOTE — Patient Instructions (Signed)
Saline nasal spray - flush nose at least 2-3x/day  nasacort nasal spray - 2 sprays each nostril one time per day.  Do this in the evening.   Robitussin - twice a day as needed.   

## 2015-02-02 NOTE — Progress Notes (Signed)
Pre-visit discussion using our clinic review tool. No additional management support is needed unless otherwise documented below in the visit note.  

## 2015-02-02 NOTE — Progress Notes (Signed)
[name], female   DOB: 1940/08/22, 74 y.o.   MRN: [id number]   Subjective:    [name], female    DOB: 07/26/1940, 74 y.o.   MRN: [id number]  HPI  Patient with past history of hypercholesterolemia, hypertension and GERD.  She comes in today to follow up on these issues as well as to discuss her bone density.   She developed some chest congestion at the beginning of the week.  Some nasal congestion.  No sinus pressure.  Non productive cough.  No fever.  No sob.  Discussed exercise.  No chest pain or tightness with increased activity or exertion.  No abdominal pain or cramping.  Bowels stable.  Handling stress well.  Discussed bone density.  Discussed treatment.  Discussed weight bearing exercise.     Past Medical History  Diagnosis Date  . Hypertension   . Hypercholesterolemia   . Allergy   . GERD (gastroesophageal reflux disease)   . Carpal tunnel syndrome 1999   Past Surgical History  Procedure Laterality Date  . Appendectomy    . Tonsillectomy    . Eye surgery      left  . Carpal tunnel release      right hand   Family History  Problem Relation Age of Onset  . Diabetes Mother   . Hypertension Brother   . Diabetes Maternal Grandmother   . Breast cancer Neg Hx   . Colon cancer Neg Hx    Social History   Social History  . Marital Status: Married    Spouse Name: N/A  . Number of Children: 3  . Years of Education: N/A   Occupational History  .  Other   Social History Main Topics  . Smoking status: Never Smoker   . Smokeless tobacco: Never Used  . Alcohol Use: 0.0 oz/week    0 Standard drinks or equivalent per week  . Drug Use: No  . Sexual Activity: No   Other Topics Concern  . None   Social History Narrative    Outpatient Encounter Prescriptions as of 02/02/2015  Medication Sig  . ALOE VERA PO Take by mouth.  Marland Kitchen aspirin (ASPIR-81) 81 MG EC tablet Take 81 mg by mouth daily. Swallow whole.  . Cholecalciferol (VITAMIN D3) 1000 UNITS  CAPS Take by mouth 2 (two) times daily.   . clonazePAM (KLONOPIN) 0.5 MG tablet Take 1 tablet (0.5 mg total) by mouth daily as needed.  . CYANOCOBALAMIN PO Take by mouth.  . hydrochlorothiazide (HYDRODIURIL) 12.5 MG tablet Take 12.5 mg by mouth as needed.   Marland Kitchen MAGNESIUM CITRATE PO Take by mouth.  . Omega-3 Fatty Acids (FISH OIL) 1000 MG CAPS Take by mouth 3 (three) times daily.  Marland Kitchen OVER THE COUNTER MEDICATION Hair supplement  . Triamcinolone Acetonide (NASACORT AQ NA) Place into the nose.  . Turmeric 500 MG CAPS Take by mouth.  . vitamin C (ASCORBIC ACID) 500 MG tablet Take 500 mg by mouth 2 (two) times daily.  . [DISCONTINUED] ciprofloxacin-dexamethasone (CIPRODEX) otic suspension Place 4 drops into the left ear 2 (two) times daily.  . [DISCONTINUED] LECITHIN PO Take 800 mg by mouth daily.  . [DISCONTINUED] meloxicam (MOBIC) 15 MG tablet Take 1 tablet (15 mg total) by mouth daily. (Patient not taking: Reported on 02/02/2015)  . [DISCONTINUED] polyethylene glycol (MIRALAX / GLYCOLAX) packet Take 17 g by mouth daily.   No facility-administered encounter medications on file as of 02/02/2015.    Review of  Systems  Constitutional: Negative for appetite change and unexpected weight change.  HENT: Negative for congestion and sinus pressure.   Respiratory: Negative for cough, chest tightness and shortness of breath.   Cardiovascular: Negative for chest pain, palpitations and leg swelling.  Gastrointestinal: Negative for nausea, vomiting, abdominal pain and diarrhea.  Genitourinary: Negative for dysuria and difficulty urinating.  Musculoskeletal: Negative for back pain and joint swelling.  Skin: Negative for color change and rash.  Neurological: Negative for dizziness, light-headedness and headaches.  Psychiatric/Behavioral: Negative for dysphoric mood and agitation.       Objective:    Physical Exam  Constitutional: She appears well-developed and well-nourished. No distress.  HENT:  Nose:  Nose normal.  Mouth/Throat: Oropharynx is clear and moist.  Eyes: Conjunctivae are normal. Right eye exhibits no discharge. Left eye exhibits no discharge.  Neck: Neck supple. No thyromegaly present.  Cardiovascular: Normal rate and regular rhythm.   Pulmonary/Chest: Breath sounds normal. No respiratory distress. She has no wheezes.  Abdominal: Soft. Bowel sounds are normal. There is no tenderness.  Musculoskeletal: She exhibits no edema or tenderness.  Lymphadenopathy:    She has no cervical adenopathy.  Skin: No rash noted. No erythema.  Psychiatric: She has a normal mood and affect. Her behavior is normal.    BP 120/80 mmHg  Pulse 85  Temp(Src) 98.5 F (36.9 C) (Oral)  Resp 18  Ht 5' 2" (1.575 m)  Wt 149 lb 4 oz (67.699 kg)  BMI 27.29 kg/m2  SpO2 97%  LMP 04/28/1992 Wt Readings from Last 3 Encounters:  02/02/15 149 lb 4 oz (67.699 kg)  12/29/14 149 lb 12.8 oz (67.949 kg)  11/02/14 149 lb 9.6 oz (67.858 kg)     Lab Results  Component Value Date   WBC 8.3 10/20/2013   HGB 13.3 10/20/2013   HCT 39.7 10/20/2013   PLT 247.0 10/20/2013   GLUCOSE 77 07/27/2014   CHOL 196 07/27/2014   TRIG 98.0 07/27/2014   HDL 55.40 07/27/2014   LDLCALC 121* 07/27/2014   ALT 33 07/27/2014   AST 27 07/27/2014   NA 138 07/27/2014   K 4.1 07/27/2014   CL 104 07/27/2014   CREATININE 0.82 07/27/2014   BUN 21 07/27/2014   CO2 30 07/27/2014   TSH 1.17 10/20/2013   HGBA1C 6.1 07/27/2014    Dg Bone Density  12/14/2014  EXAM: DUAL X-RAY ABSORPTIOMETRY (DXA) FOR BONE MINERAL DENSITY IMPRESSION: Dear Dr. Nicki Reaper, Your patient Donna Lara completed a BMD test on 12/14/2014 using the Donna Lara (analysis version: 14.10) manufactured by [employer]. The following summarizes the results of our evaluation. PATIENT BIOGRAPHICAL: Name: Donna Lara, Donna Lara Patient ID: [id number] Birth Date: 10-29-1940 Height: 62.0 in. Gender: Female Exam Date: 12/14/2014 Weight: 152.3 lbs. Indications: Caucasian,  Postmenopausal Fractures: Treatments: ASPRIN 81 MG, Vit D ASSESSMENT: The BMD measured at Femur Neck Right is 0.706 g/cm2 with a T-score of -2.4. This patient is considered OSTEOPENIC according to [facility]) criteria. L-3 and L-4 were excluded due to degenerative changes. Site Region Measured Measured WHO Young Adult BMD Date       Age      Classification T-score AP Spine L1-L2 12/14/2014 71.8 Osteopenia -1.6 0.979 g/cm2 DualFemur Neck Right 12/14/2014 71.8 Osteopenia -2.4 0.706 g/cm2 World Health Organization [facility]) criteria for post-menopausal, Caucasian Women: Normal:       T-score at or above -1 SD Osteopenia:   T-score between -1 and -2.5 SD Osteoporosis: T-score at or below -2.5 SD  RECOMMENDATIONS: National Osteoporosis Foundation recommends that FDA-approved medical therapies be considered in postmenopausal women and men age 16 or older with a: 1. Hip or vertebral (clinical or morphometric) fracture. 2. T-score of < -2.5 at the spine or hip. 3. Ten-year fracture probability by FRAX of 3% or greater for hip fracture or 20% or greater for major osteoporotic fracture. All treatment decisions require clinical judgment and consideration of individual patient factors, including patient preferences, co-morbidities, previous drug use, risk factors not captured in the FRAX model (e.g. falls, vitamin D deficiency, increased bone turnover, interval significant decline in bone density) and possible under - or over-estimation of fracture risk by FRAX. All patients should ensure an adequate intake of dietary calcium (1200 mg/d) and vitamin D (800 IU daily) unless contraindicated. FOLLOW-UP: People with diagnosed cases of osteoporosis or at high risk for fracture should have regular bone mineral density tests. For patients eligible for Medicare, routine testing is allowed once every 2 years. The testing frequency can be increased to one year for patients who have rapidly progressing disease, those who  are receiving or discontinuing medical therapy to restore bone mass, or have additional risk factors. I have reviewed this report, and agree with the above findings. Mark A. Thornton Papas, M.D. [facility] Dear Dr. Nicki Reaper, Your patient Donna Lara completed a FRAX assessment on 12/14/2014 using the Fort[employer] (analysis version: 14.10) manufactured by [employer]. The following summarizes the results of our evaluation. PATIENT BIOGRAPHICAL: Name: Donna Lara, Donna Lara Patient ID: [id number] Birth Date: 1940/12/23 Height:    62.0 in. Gender:     Female    [age]       Weight:    152.3 lbs. Ethnicity:  White                            Exam Date: 12/14/2014 FRAX* RESULTS:  (version: 3.5) 10-year Probability of Fracture1 Major Osteoporotic Fracture2 Hip Fracture 14.7% 3.8% Population: [place] (Caucasian) Risk Factors: None Based on Femur (Right) Neck BMD 1 -The 10-year probability of fracture may be lower than reported if the patient has received treatment. 2 -Major Osteoporotic Fracture: Clinical Spine, Forearm, Hip or Shoulder *FRAX is a Materials engineer of the [employer] for Metabolic Bone Disease, a [place] (WHO) [facility]. ASSESSMENT: The probability of a major osteoporotic fracture is 14.7% within the next ten years. The probability of a hip fracture is 3.8% within the next ten years. Mark A. Thornton Papas, M.D. [facility] Electronically Signed   By: Lavonia Dana M.D.   On: 12/14/2014 13:59       Assessment & Plan:   Problem List Items Addressed This Visit    Environmental allergies    Symptoms as outlined.  Do not feel abx warranted at this time.  Saline nasal spray and nasacort nasal spray as directed.  Follow.        Hypercholesterolemia    Low cholesterol diet and exercise.  Follow lipid panel.        Relevant Orders   Hepatic function panel   Lipid panel   Hyperglycemia    Low carb diet and exercise.  Follow met b and a1c.         Relevant Orders   Hemoglobin A1c   Hypertension - Primary    Blood pressure as outlined.  Same medication regimen.  Follow pressures.  Follow metabolic  panel.        Relevant Orders   TSH   CBC with Differential/Platelet   Basic metabolic panel   Osteopenia    Bone density as outlined.  Discussed treatment options.  She desires to think about starting prescription medication.  Continue calcium and vitaminD.  Discussed weight bearing exercise.        Stress    Overall feels she is doing relatively well.  Follow.            [name], MD

## 2015-02-03 ENCOUNTER — Encounter: Payer: Self-pay | Admitting: Internal Medicine

## 2015-02-03 NOTE — Assessment & Plan Note (Signed)
Symptoms as outlined.  Do not feel abx warranted at this time.  Saline nasal spray and nasacort nasal spray as directed.  Follow.

## 2015-02-03 NOTE — Assessment & Plan Note (Signed)
Low cholesterol diet and exercise.  Follow lipid panel.   

## 2015-02-03 NOTE — Assessment & Plan Note (Signed)
Blood pressure as outlined.  Same medication regimen.  Follow pressures.  Follow metabolic panel.  

## 2015-02-03 NOTE — Assessment & Plan Note (Signed)
Bone density as outlined.  Discussed treatment options.  She desires to think about starting prescription medication.  Continue calcium and vitaminD.  Discussed weight bearing exercise.

## 2015-02-03 NOTE — Assessment & Plan Note (Signed)
Overall feels she is doing relatively well.  Follow.

## 2015-02-03 NOTE — Assessment & Plan Note (Signed)
Low carb diet and exercise.  Follow met b and a1c.  

## 2015-02-15 ENCOUNTER — Other Ambulatory Visit: Payer: Self-pay

## 2015-02-19 ENCOUNTER — Other Ambulatory Visit: Payer: Self-pay | Admitting: *Deleted

## 2015-02-19 MED ORDER — CLONAZEPAM 0.5 MG PO TABS: 0.5000 mg | ORAL_TABLET | Freq: Every day | ORAL | Status: DC | PRN

## 2015-03-20 DIAGNOSIS — H2513 Age-related nuclear cataract, bilateral: Secondary | ICD-10-CM | POA: Diagnosis not present

## 2015-04-23 ENCOUNTER — Other Ambulatory Visit: Payer: Self-pay | Admitting: Internal Medicine

## 2015-04-23 DIAGNOSIS — Z1231 Encounter for screening mammogram for malignant neoplasm of breast: Secondary | ICD-10-CM

## 2015-05-14 ENCOUNTER — Ambulatory Visit
Admission: RE | Admit: 2015-05-14 | Discharge: 2015-05-14 | Disposition: A | Discharge status: Home / Self Care | Payer: Medicare Other | Source: Ambulatory Visit | Attending: Internal Medicine | Admitting: Internal Medicine

## 2015-05-14 ENCOUNTER — Other Ambulatory Visit: Payer: Self-pay | Admitting: Internal Medicine

## 2015-05-14 DIAGNOSIS — Z1231 Encounter for screening mammogram for malignant neoplasm of breast: Secondary | ICD-10-CM | POA: Diagnosis not present

## 2015-06-05 DIAGNOSIS — H34212 Partial retinal artery occlusion, left eye: Secondary | ICD-10-CM | POA: Diagnosis not present

## 2015-06-05 DIAGNOSIS — H2511 Age-related nuclear cataract, right eye: Secondary | ICD-10-CM | POA: Diagnosis not present

## 2015-06-06 ENCOUNTER — Telehealth: Payer: Self-pay | Admitting: Internal Medicine

## 2015-06-06 NOTE — Telephone Encounter (Signed)
I can see her on Friday 8:00 (block 30 min).  Also, I have not seen her notes from [facility].  If we can make sure they send what we need for visit.  Thanks.

## 2015-06-06 NOTE — Telephone Encounter (Signed)
Pt needs to have a follow up appt per Eye doctor. Pt stated that they have sent you information on her visit for cataract on eye. Please advise where to add patient on to schedule.msn

## 2015-06-08 ENCOUNTER — Ambulatory Visit (INDEPENDENT_AMBULATORY_CARE_PROVIDER_SITE_OTHER): Payer: Medicare Other | Admitting: Internal Medicine

## 2015-06-08 ENCOUNTER — Encounter: Payer: Self-pay | Admitting: Internal Medicine

## 2015-06-08 VITALS — BP 128/80 | HR 77 | Temp 97.7°F | Resp 18 | Ht 62.0 in | Wt 145.5 lb

## 2015-06-08 DIAGNOSIS — F439 Reaction to severe stress, unspecified: Secondary | ICD-10-CM

## 2015-06-08 DIAGNOSIS — R739 Hyperglycemia, unspecified: Secondary | ICD-10-CM

## 2015-06-08 DIAGNOSIS — E7800 Pure hypercholesterolemia, unspecified: Secondary | ICD-10-CM

## 2015-06-08 DIAGNOSIS — H3589 Other specified retinal disorders: Secondary | ICD-10-CM | POA: Diagnosis not present

## 2015-06-08 DIAGNOSIS — H34219 Partial retinal artery occlusion, unspecified eye: Secondary | ICD-10-CM

## 2015-06-08 DIAGNOSIS — I10 Essential (primary) hypertension: Secondary | ICD-10-CM

## 2015-06-08 DIAGNOSIS — Z658 Other specified problems related to psychosocial circumstances: Secondary | ICD-10-CM

## 2015-06-08 LAB — HEMOGLOBIN A1C: HEMOGLOBIN A1C: 6 % (ref 4.6–6.5)

## 2015-06-08 LAB — HEPATIC FUNCTION PANEL
ALBUMIN: 4.6 g/dL (ref 3.5–5.2)
ALT: 17 U/L (ref 0–35)
AST: 22 U/L (ref 0–37)
Alkaline Phosphatase: 85 U/L (ref 39–117)
Bilirubin, Direct: 0.1 mg/dL (ref 0.0–0.3)
TOTAL PROTEIN: 7.1 g/dL (ref 6.0–8.3)
Total Bilirubin: 0.7 mg/dL (ref 0.2–1.2)

## 2015-06-08 LAB — CBC WITH DIFFERENTIAL/PLATELET
BASOS ABS: 0 10*3/uL (ref 0.0–0.1)
Basophils Relative: 0.5 % (ref 0.0–3.0)
EOS PCT: 1.6 % (ref 0.0–5.0)
Eosinophils Absolute: 0.1 10*3/uL (ref 0.0–0.7)
HCT: 44.7 % (ref 36.0–46.0)
HEMOGLOBIN: 15.2 g/dL — AB (ref 12.0–15.0)
Lymphocytes Relative: 25.4 % (ref 12.0–46.0)
Lymphs Abs: 1.7 10*3/uL (ref 0.7–4.0)
MCHC: 33.9 g/dL (ref 30.0–36.0)
MCV: 85.7 fl (ref 78.0–100.0)
MONO ABS: 0.6 10*3/uL (ref 0.1–1.0)
MONOS PCT: 8.4 % (ref 3.0–12.0)
Neutro Abs: 4.2 10*3/uL (ref 1.4–7.7)
Neutrophils Relative %: 64.1 % (ref 43.0–77.0)
Platelets: 246 10*3/uL (ref 150.0–400.0)
RBC: 5.22 Mil/uL — AB (ref 3.87–5.11)
RDW: 13.5 % (ref 11.5–15.5)
WBC: 6.6 10*3/uL (ref 4.0–10.5)

## 2015-06-08 LAB — BASIC METABOLIC PANEL
BUN: 17 mg/dL (ref 6–23)
CHLORIDE: 104 meq/L (ref 96–112)
CO2: 28 meq/L (ref 19–32)
CREATININE: 0.76 mg/dL (ref 0.40–1.20)
Calcium: 10 mg/dL (ref 8.4–10.5)
GFR: 79.44 mL/min (ref >60.00)
Glucose, Bld: 101 mg/dL — ABNORMAL HIGH (ref 70–99)
POTASSIUM: 4.3 meq/L (ref 3.5–5.1)
Sodium: 141 mEq/L (ref 135–145)

## 2015-06-08 LAB — LIPID PANEL
CHOLESTEROL: 242 mg/dL — AB (ref 0–200)
HDL: 58.8 mg/dL (ref >39.00)
LDL Cholesterol: 160 mg/dL — ABNORMAL HIGH (ref 0–99)
NONHDL: 183.43
Total CHOL/HDL Ratio: 4
Triglycerides: 117 mg/dL (ref 0.0–149.0)
VLDL: 23.4 mg/dL (ref 0.0–40.0)

## 2015-06-08 LAB — TSH: TSH: 1.21 u[IU]/mL (ref 0.35–4.50)

## 2015-06-08 NOTE — Progress Notes (Signed)
Pre-visit discussion using our clinic review tool. No additional management support is needed unless otherwise documented below in the visit note.  

## 2015-06-08 NOTE — Progress Notes (Signed)
[name], female   DOB: 30-Jul-1940, 75 y.o.   MRN: [id number]   Subjective:    [name], female    DOB: [birth year], 75 y.o.   MRN: [id number]  HPI  Patient with past history of hypercholesterolemia, GERD and hypertension.  She comes in today as a work in at request from her eye doctor.  On exam, found to have hollenhorst plaque.  Needs further w/up.  We discussed this today.  Discussed need for obtaining carotid ultrasound and echo.  Discussed the need for more strict cholesterol control.  She reports increased stress.  Her husband died in May 16, 2022.  She is trying to cope with his death.  Has good support.  Does not feel needs anything more at this point.  She reports noticing some left side pain at times.  Notices when is more constipated.  She is on a bowel regimen now. Bowels are doing better and no significant pain since bowels have improved.  Breathing stable.  No nausea or vomiting.     Past Medical History  Diagnosis Date  . Hypertension   . Hypercholesterolemia   . Allergy   . GERD (gastroesophageal reflux disease)   . Carpal tunnel syndrome 1999   Past Surgical History  Procedure Laterality Date  . Appendectomy    . Tonsillectomy    . Eye surgery      left  . Carpal tunnel release      right hand   Family History  Problem Relation Age of Onset  . Diabetes Mother   . Hypertension Brother   . Diabetes Maternal Grandmother   . Breast cancer Neg Hx   . Colon cancer Neg Hx    Social History   Social History  . Marital Status: Married    Spouse Name: N/A  . Number of Children: 3  . Years of Education: N/A   Occupational History  .  Other   Social History Main Topics  . Smoking status: Never Smoker   . Smokeless tobacco: Never Used  . Alcohol Use: 0.0 oz/week    0 Standard drinks or equivalent per week  . Drug Use: No  . Sexual Activity: No   Other Topics Concern  . None   Social History Narrative    Outpatient Encounter  Prescriptions as of 06/08/2015  Medication Sig  . ALOE VERA PO Take by mouth.  Marland Kitchen aspirin (ASPIR-81) 81 MG EC tablet Take 81 mg by mouth daily. Swallow whole.  . Cholecalciferol (VITAMIN D3) 1000 UNITS CAPS Take by mouth 2 (two) times daily.   . clonazePAM (KLONOPIN) 0.5 MG tablet Take 1 tablet (0.5 mg total) by mouth daily as needed.  . CYANOCOBALAMIN PO Take by mouth.  . hydrochlorothiazide (HYDRODIURIL) 12.5 MG tablet Take 12.5 mg by mouth as needed.   Marland Kitchen MAGNESIUM CITRATE PO Take by mouth.  . Omega-3 Fatty Acids (FISH OIL) 1000 MG CAPS Take by mouth 3 (three) times daily.  Marland Kitchen OVER THE COUNTER MEDICATION Hair supplement  . Triamcinolone Acetonide (NASACORT AQ NA) Place into the nose.  . Turmeric 500 MG CAPS Take by mouth.  . vitamin C (ASCORBIC ACID) 500 MG tablet Take 500 mg by mouth 2 (two) times daily.   No facility-administered encounter medications on file as of 06/08/2015.    Review of Systems  Constitutional: Negative for appetite change and unexpected weight change.  HENT: Negative for congestion and sinus pressure.   Respiratory: Negative for cough, chest tightness and  shortness of breath.   Cardiovascular: Negative for chest pain, palpitations and leg swelling.  Gastrointestinal: Positive for abdominal pain (better as outlined. ) and constipation. Negative for nausea, vomiting and diarrhea.  Genitourinary: Negative for dysuria and difficulty urinating.  Musculoskeletal: Negative for back pain and joint swelling.  Skin: Negative for color change and rash.  Neurological: Negative for dizziness, light-headedness and headaches.  Psychiatric/Behavioral: Negative for agitation.       Increased stress as outlined.        Objective:    Physical Exam  Constitutional: She appears well-developed and well-nourished. No distress.  HENT:  Nose: Nose normal.  Mouth/Throat: Oropharynx is clear and moist.  Eyes: Conjunctivae are normal. Right eye exhibits no discharge. Left eye exhibits  no discharge.  Neck: Neck supple. No thyromegaly present.  Cardiovascular: Normal rate and regular rhythm.   Pulmonary/Chest: Breath sounds normal. No respiratory distress. She has no wheezes.  Abdominal: Soft. Bowel sounds are normal. There is no tenderness.  Musculoskeletal: She exhibits no edema or tenderness.  Lymphadenopathy:    She has no cervical adenopathy.  Skin: No rash noted. No erythema.  Psychiatric: She has a normal mood and affect.    BP 128/80 mmHg  Pulse 77  Temp(Src) 97.7 F (36.5 C) (Oral)  Resp 18  Ht 5' 2"  (1.575 m)  Wt 145 lb 8 oz (65.998 kg)  BMI 26.61 kg/m2  SpO2 97%  LMP 04/28/1992 Wt Readings from Last 3 Encounters:  06/08/15 145 lb 8 oz (65.998 kg)  02/02/15 149 lb 4 oz (67.699 kg)  12/29/14 149 lb 12.8 oz (67.949 kg)     Lab Results  Component Value Date   WBC 6.6 06/08/2015   HGB 15.2* 06/08/2015   HCT 44.7 06/08/2015   PLT 246.0 06/08/2015   GLUCOSE 101* 06/08/2015   CHOL 242* 06/08/2015   TRIG 117.0 06/08/2015   HDL 58.80 06/08/2015   LDLCALC 160* 06/08/2015   ALT 17 06/08/2015   AST 22 06/08/2015   NA 141 06/08/2015   K 4.3 06/08/2015   CL 104 06/08/2015   CREATININE 0.76 06/08/2015   BUN 17 06/08/2015   CO2 28 06/08/2015   TSH 1.21 06/08/2015   HGBA1C 6.0 06/08/2015        Assessment & Plan:   Problem List Items Addressed This Visit    Hollenhorst plaque - Primary    Discussed with her today.  Will check carotid ultrasound and echo.  Continue daily aspirin.  Check cholesterol.        Relevant Orders   US Carotid Duplex Bilateral   ECHOCARDIOGRAM COMPLETE   Hypercholesterolemia    Low cholesterol diet and exercise.  Has declined cholesterol medication previously.  Discussed given eye exam findings, will need to be more strict about cholesterol levels.  Check lipid panel.        Relevant Orders   ECHOCARDIOGRAM COMPLETE   Hyperglycemia    Low carb diet and exercise.  Follow met b and a1c.       Hypertension     Blood pressure under good control.  Continue same medication regimen.  Follow pressures.  Follow metabolic panel.        Relevant Orders   ECHOCARDIOGRAM COMPLETE   Stress    Increased stress dealing with her husband's recent death.  Discussed with her today.  She does not feel needs any further intervention at this time.  Follow.            [name], MD

## 2015-06-09 ENCOUNTER — Encounter: Payer: Self-pay | Admitting: Internal Medicine

## 2015-06-09 DIAGNOSIS — H34219 Partial retinal artery occlusion, unspecified eye: Secondary | ICD-10-CM | POA: Insufficient documentation

## 2015-06-09 NOTE — Assessment & Plan Note (Signed)
Blood pressure under good control.  Continue same medication regimen.  Follow pressures.  Follow metabolic panel.   

## 2015-06-09 NOTE — Assessment & Plan Note (Signed)
Discussed with her today.  Will check carotid ultrasound and echo.  Continue daily aspirin.  Check cholesterol.

## 2015-06-09 NOTE — Assessment & Plan Note (Signed)
Increased stress dealing with her husband's recent death.  Discussed with her today.  She does not feel needs any further intervention at this time.  Follow.

## 2015-06-09 NOTE — Assessment & Plan Note (Signed)
Low cholesterol diet and exercise.  Has declined cholesterol medication previously.  Discussed given eye exam findings, will need to be more strict about cholesterol levels.  Check lipid panel.

## 2015-06-09 NOTE — Assessment & Plan Note (Signed)
Low carb diet and exercise.  Follow met b and a1c.  

## 2015-06-13 ENCOUNTER — Telehealth: Payer: Self-pay | Admitting: Internal Medicine

## 2015-06-14 ENCOUNTER — Other Ambulatory Visit: Payer: Self-pay | Admitting: Internal Medicine

## 2015-06-15 NOTE — Telephone Encounter (Signed)
Okay to refill? Last seen on 06/08/15 & next OV: 08/20/15. Please advise

## 2015-06-16 NOTE — Telephone Encounter (Signed)
ok'd clonazepam #30 with no refills.   

## 2015-06-18 ENCOUNTER — Telehealth: Payer: Self-pay

## 2015-06-18 NOTE — Telephone Encounter (Signed)
Renne Musca from [facility] called to see if patient had her ECHO and US of the carotids completed yet.  She is scheduled to have cataract surgery with Dr. Leeroy Bock  on the the 23rd of march and these need to be completed prior to the surgery.  Please assist.  Once they are scheduled we need to call Lattie Haw and inform her of the dates.  Call her back at either [phone]. [phone] or [phone].

## 2015-06-18 NOTE — Telephone Encounter (Signed)
When will these tests be done?  Apparently they are needing these before her surgery on 06/28/15.  Please advise pt and let dr Gerilyn Pilgrim office note of scheduled dates.  Thanks

## 2015-06-19 NOTE — Telephone Encounter (Signed)
Echo scheduled on 3/17 @ 2. Carotid duplex has not been scheduled yet. I created the referral and sent to [facility]  to schedule prior to 3/23

## 2015-06-20 ENCOUNTER — Telehealth: Payer: Self-pay | Admitting: Cardiology

## 2015-06-20 ENCOUNTER — Other Ambulatory Visit: Payer: Self-pay

## 2015-06-20 ENCOUNTER — Encounter: Payer: Self-pay | Admitting: Cardiology

## 2015-06-20 ENCOUNTER — Ambulatory Visit (INDEPENDENT_AMBULATORY_CARE_PROVIDER_SITE_OTHER): Payer: Medicare Other | Admitting: Cardiology

## 2015-06-20 VITALS — BP 120/80 | HR 71 | Ht 62.0 in | Wt 142.0 lb

## 2015-06-20 DIAGNOSIS — I10 Essential (primary) hypertension: Secondary | ICD-10-CM

## 2015-06-20 DIAGNOSIS — H3589 Other specified retinal disorders: Secondary | ICD-10-CM

## 2015-06-20 DIAGNOSIS — H34219 Partial retinal artery occlusion, unspecified eye: Secondary | ICD-10-CM

## 2015-06-20 DIAGNOSIS — R079 Chest pain, unspecified: Secondary | ICD-10-CM

## 2015-06-20 DIAGNOSIS — E7800 Pure hypercholesterolemia, unspecified: Secondary | ICD-10-CM

## 2015-06-20 NOTE — Telephone Encounter (Signed)
S/w pt of bubble study echo March 20, arrival time 9:45am [facility]. Pt agreeable w/plan

## 2015-06-20 NOTE — Progress Notes (Signed)
PATIENT: Donna Lara MRN: [id number] DOB: 11/05/1940 PCP: [name], MD  Clinic Note: Chief Complaint  Patient presents with  . New Evaluation    Hollenhorst plaque  . Hypertension    NO CHEST [AIN, SOB OR SWELLING.NO COMPLAINTS    HPI: Donna Lara is a 75 y.o. female with a PMH below who presents today for Evaluation for Hollenhorst plaque.  She was apparently going to see an eye doctor in [place] at "[facility] ". She had an original eye exam that revealed home on warfarin plaque but otherwise normal flow. The ophthalmologist recommended full evaluation with echocardiogram and carotid Dopplers. These are actually been ordered, but she now presents for cardiology consult.    Interval History:  Donna Lara feels great she has no cardiac symptoms whatsoever. She barely takes her HCTZ, because her blood pressure goes too low. She has not had any TIA or amaurosis fugax type symptoms. She has chronic eye blurring from her cataracts but nothing acute. She has been under a lot of anxiety and stress over last few months since her husband died because she is now having to do a lot of paperwork and this is been heart both the perspective of doing work but also not having her husband. She is not had any chest tightness or pressure rest or exertion. She walks and exercises routinely. She denies any PND, orthopnea or edema.  The remainder of cardiac review of systems is as follows: Cardiovascular ROS: no chest pain or dyspnea on exertion negative for - edema, irregular heartbeat, loss of consciousness, murmur, orthopnea, palpitations, paroxysmal nocturnal dyspnea, rapid heart rate, shortness of breath or Syncope/near syncope, TIA/amaurosis fugax, claudication :  Past Medical History  Diagnosis Date  . Hypertension     Borderline. She does not take the 12.5 HCTZ daily  . Hypercholesterolemia     By last lipid panel  . Allergy   . GERD (gastroesophageal reflux disease)   . Carpal tunnel  syndrome 1999    Prior Cardiac Evaluation and Past Surgical History: Past Surgical History  Procedure Laterality Date  . Appendectomy    . Tonsillectomy    . Eye surgery      left  . Carpal tunnel release      right hand    Allergies  Allergen Reactions  . Codeine Sulfate     Insomnia. Out of control.    Current Outpatient Prescriptions  Medication Sig Dispense Refill  . ALOE VERA PO Take 1 tablet by mouth daily.     Marland Kitchen aspirin (ASPIR-81) 81 MG EC tablet Take 81 mg by mouth daily. Swallow whole.    . Cholecalciferol (VITAMIN D3) 1000 UNITS CAPS Take 1,000 Units by mouth 2 (two) times daily.     . clonazePAM (KLONOPIN) 0.5 MG tablet TAKE ONE TABLET DAILY AS NEEDED. (Patient taking differently: TAKE ONE TABLET by mouth DAILY AS NEEDED for anxiety) 30 tablet 0  . CYANOCOBALAMIN PO Take 1 tablet by mouth daily.     . hydrochlorothiazide (HYDRODIURIL) 12.5 MG tablet Take 12.5 mg by mouth as needed (as needed for edema and hypertension).     Marland Kitchen MAGNESIUM CITRATE PO Take 1 tablet by mouth daily.     . Omega-3 Fatty Acids (FISH OIL) 1000 MG CAPS Take 1,000 mg by mouth 3 (three) times daily.     Marland Kitchen OVER THE COUNTER MEDICATION Take 1 tablet by mouth daily. Hair supplement    . Triamcinolone Acetonide (NASACORT AQ NA) Place 1 spray  into both nostrils daily.     . Turmeric 500 MG CAPS Take 1 tablet by mouth daily.     . vitamin C (ASCORBIC ACID) 500 MG tablet Take 500 mg by mouth 2 (two) times daily.     No current facility-administered medications for this visit.   Social History  Substance Use Topics  . Smoking status: Never Smoker   . Smokeless tobacco: Never Used  . Alcohol Use: 0.0 oz/week    0 Standard drinks or equivalent per week   Social History   Social History Narrative   Recently widowed. Her husband died of a heart attack in 05/05/2015    family history includes Diabetes in her maternal grandmother and mother; Hypertension in her brother. There is no history of  Breast cancer or Colon cancer.  ROS: A comprehensive Review of Systems - was performed Review of Systems  Constitutional: Negative for fever, chills and malaise/fatigue.  HENT: Negative for nosebleeds.   Eyes:       Chronic vision issues  Respiratory: Negative for cough and shortness of breath.   Cardiovascular: Negative for chest pain, palpitations and PND.       Some "jitteriness" when she was drinking lots of coffee after death of husband --> now not drinking coffee.  Gastrointestinal: Negative for heartburn, blood in stool and melena.  Genitourinary: Negative for hematuria.  Musculoskeletal: Negative for myalgias.  Neurological: Negative for dizziness, sensory change, speech change, focal weakness, loss of consciousness and headaches.  Psychiatric/Behavioral: Positive for depression (in mourning period - husband's death). Negative for memory loss. The patient is nervous/anxious.        Lots of social stressors with recent death of her husband in 05-May-2015  All other systems reviewed and are negative.   PHYSICAL EXAM BP 120/80 mmHg  Pulse 71  Ht 5\' 2"  (1.575 m)  Wt 142 lb (64.411 kg)  BMI 25.97 kg/m2  LMP 04/28/1992 General appearance: alert, cooperative, appears stated age, no distress and Somewhat anxious appearing, but otherwise healthy. Normal mood and affect HEENT: [place]/AT, EOMI, MMM, anicteric sclera Neck: no adenopathy, no carotid bruit, no JVD, supple, symmetrical, trachea midline and thyroid not enlarged, symmetric, no tenderness/mass/nodules Lungs: clear to auscultation bilaterally, normal percussion bilaterally and Nonlabored, good air movement Heart: RRR with normal S1 and S2. She is a very faint 1/6 SEM at RUSB but otherwise no M/R/G. Nondisplaced PMI. Abdomen: soft, non-tender; bowel sounds normal; no masses,  no organomegaly Extremities: extremities normal, atraumatic, no cyanosis or edema Pulses: 2+ and symmetric Skin: Skin color, texture, turgor normal. No rashes  or lesions Neurologic: Grossly normal   Adult ECG Report  Rate: 71 ;  Rhythm: normal sinus rhythm and Normal axis, intervals and durations.   Narrative Interpretation: Normal EKG  Recent Labs: None available  ASSESSMENT / PLAN: Relatively healthy 75 year old woman with borderline hypertension and dyslipidemia referred for evaluation of Hollenhorst plaque. We are asked to an echocardiogram and carotid Dopplers. Studies or even done. I will change the echocardiogram to bubble study in order to evaluate for any thromboembolic source. Otherwise she is totally stable cardiac standpoint. EKG is normal she is asymptomatic. Her blood pressure stable and she does not take her HCTZ. She is on fish oil, with lipids that are out of goal. May need to consider some type of therapy. She is reluctant to take statins, but would be agreeable if there is any sign of carotid disease.  Otherwise no active cardiac symptoms and I would likely  refer back to her primary physician depending on the results of the echo and carotid Dopplers.  Problem List Items Addressed This Visit    Hypertension - Primary    She really barely takes her HCTZ. I think this is not really a true diagnosis for her. Her blood pressure looks great today and she has not taken HCTZ recently.      Relevant Orders   EKG 12-Lead   Hypercholesterolemia    Initial plan has been dietary and lifestyle modifications. As noted she was reluctant to discuss cholesterol medications, for fear of potential adverse side effects. I do think we need to set a new target level for her lipid control based on the findings of Hollenhorst plaque. Would consider trying to shoot for LDL less than 100.      Hollenhorst plaque    Was referred for cardiology consultation, not exactly sure why. She has an echocardiogram and carotid ultrasound already ordered. I will convert the echocardiogram to echocardiogram with bubble study. Lipids were checked by her PCP and  were not at goal. Would recommend therapy. She is reluctant to do statins. May need gentle coaxing. Will defer to PCP         No orders of the defined types were placed in this encounter.    Followup: 1-2 months -to discuss the results of echo and carotid Dopplers. If they are normal, she can simply follow-up as needed.     Leonie Man, M.D., M.S.  [employer]  [address] [place], [place] [address] [phone] Fax [phone]

## 2015-06-20 NOTE — Assessment & Plan Note (Signed)
Initial plan has been dietary and lifestyle modifications. As noted she was reluctant to discuss cholesterol medications, for fear of potential adverse side effects. I do think we need to set a new target level for her lipid control based on the findings of Hollenhorst plaque. Would consider trying to shoot for LDL less than 100.

## 2015-06-20 NOTE — Assessment & Plan Note (Addendum)
She really barely takes her HCTZ. I think this is not really a true diagnosis for her. Her blood pressure looks great today and she has not taken HCTZ recently.

## 2015-06-20 NOTE — Patient Instructions (Signed)
Medication Instructions:  Your physician recommends that you continue on your current medications as directed. Please refer to the Current Medication list given to you today.   Labwork: none  Testing/Procedures: Your physician has requested that you have an echocardiogram. Echocardiography is a painless test that uses sound waves to create images of your heart. It provides your doctor with information about the size and shape of your heart and how well your heart's chambers and valves are working. This procedure takes approximately one hour. There are no restrictions for this procedure.    Follow-Up: Your physician recommends that you schedule a follow-up appointment pending echo results.    Echocardiogram An echocardiogram, or echocardiography, uses sound waves (ultrasound) to produce an image of your heart. The echocardiogram is simple, painless, obtained within a short period of time, and offers valuable information to your health care provider. The images from an echocardiogram can provide information such as:  Evidence of coronary artery disease (CAD).  Heart size.  Heart muscle function.  Heart valve function.  Aneurysm detection.  Evidence of a past heart attack.  Fluid buildup around the heart.  Heart muscle thickening.  Assess heart valve function. LET [facility] CARE PROVIDER KNOW ABOUT:  Any allergies you have.  All medicines you are taking, including vitamins, herbs, eye drops, creams, and over-the-counter medicines.  Previous problems you or members of your family have had with the use of anesthetics.  Any blood disorders you have.  Previous surgeries you have had.  Medical conditions you have.  Possibility of pregnancy, if this applies. BEFORE THE PROCEDURE  No special preparation is needed. Eat and drink normally.  PROCEDURE   In order to produce an image of your heart, gel will be applied to your chest and a wand-like tool (transducer) will be  moved over your chest. The gel will help transmit the sound waves from the transducer. The sound waves will harmlessly bounce off your heart to allow the heart images to be captured in real-time motion. These images will then be recorded.  You may need an IV to receive a medicine that improves the quality of the pictures. AFTER THE PROCEDURE You may return to your normal schedule including diet, activities, and medicines, unless your health care provider tells you otherwise.   This information is not intended to replace advice given to you by your health care provider. Make sure you discuss any questions you have with your health care provider.   Document Released: 03/21/2000 Document Revised: 04/14/2014 Document Reviewed: 11/29/2012 Elsevier Interactive Patient Education 2016 [employer].    Any Other Special Instructions Will Be Listed Below (If Applicable).     If you need a refill on your cardiac medications before your next appointment, please call your pharmacy.

## 2015-06-20 NOTE — Assessment & Plan Note (Signed)
Was referred for cardiology consultation, not exactly sure why. She has an echocardiogram and carotid ultrasound already ordered. I will convert the echocardiogram to echocardiogram with bubble study. Lipids were checked by her PCP and were not at goal. Would recommend therapy. She is reluctant to do statins. May need gentle coaxing. Will defer to PCP

## 2015-06-22 ENCOUNTER — Other Ambulatory Visit: Payer: Federal, State, Local not specified - PPO

## 2015-06-25 ENCOUNTER — Telehealth: Payer: Self-pay

## 2015-06-25 ENCOUNTER — Telehealth: Payer: Self-pay | Admitting: Cardiology

## 2015-06-25 ENCOUNTER — Ambulatory Visit
Admission: RE | Admit: 2015-06-25 | Discharge: 2015-06-25 | Disposition: A | Discharge status: Home / Self Care | Payer: Medicare Other | Source: Ambulatory Visit | Attending: Cardiology | Admitting: Cardiology

## 2015-06-25 DIAGNOSIS — I10 Essential (primary) hypertension: Secondary | ICD-10-CM | POA: Insufficient documentation

## 2015-06-25 DIAGNOSIS — R079 Chest pain, unspecified: Secondary | ICD-10-CM | POA: Diagnosis not present

## 2015-06-25 DIAGNOSIS — I071 Rheumatic tricuspid insufficiency: Secondary | ICD-10-CM | POA: Diagnosis not present

## 2015-06-25 DIAGNOSIS — K219 Gastro-esophageal reflux disease without esophagitis: Secondary | ICD-10-CM | POA: Insufficient documentation

## 2015-06-25 DIAGNOSIS — E7800 Pure hypercholesterolemia, unspecified: Secondary | ICD-10-CM | POA: Insufficient documentation

## 2015-06-25 NOTE — Telephone Encounter (Signed)
Patient had bubble study and remembered when prompted by tech with questions that she was admitted to [facility] 6 years ago for a brain bleed s/p fall. Patient wanted Dr. Ellyn Hack to know.

## 2015-06-25 NOTE — Telephone Encounter (Signed)
Renne Musca from [place] would like the results from pt's [place] faxed to their office for her cataract surgery the fax number is [phone] Attn: Renne Musca. Please advise, thanks

## 2015-06-25 NOTE — Progress Notes (Signed)
*  PRELIMINARY RESULTS* Echocardiogram 2D Echocardiogram has been performed.  Donna Lara 06/25/2015, 10:46 AM

## 2015-06-26 ENCOUNTER — Telehealth: Payer: Self-pay | Admitting: Cardiology

## 2015-06-26 NOTE — Telephone Encounter (Signed)
Pt is calling back, states she has not heard about if she is cleared for her surgery on Thursday.States she called yesterday to make sure Dr. Ellyn Hack knew of a fall she had 6 years ago.

## 2015-06-26 NOTE — Telephone Encounter (Signed)
S/w pt who reports she is having cataract surgery Thursday with Dr. Dahlia Bailiff at [facility] in [place] she was told by Dr. Leeroy Bock she needed carotid US and echo.  She went to her PCP then had March 15 OV with Dr. Ellyn Hack. Bubble study completed.  She is unsure what to do now. We have not received cardiac clearance from [facility]. Pt provided Anthony number, [phone]. Will call tomorrow to determine what information is needed prior to cataract surgery.

## 2015-06-26 NOTE — Telephone Encounter (Signed)
Forward to Dr. Ellyn Hack regarding clearance for cataract surgery

## 2015-06-28 NOTE — Telephone Encounter (Signed)
Dr Vladimir Creeks Office called nurse Renne Musca calling to give [place] a fax number  [phone] Please fax clearance to this number.  She also states they rescheduled the cataract surgery to next week but still needs to know as soon as possible .

## 2015-06-28 NOTE — Telephone Encounter (Signed)
Ms. Dancey is low risk for cataract surgery.

## 2015-06-28 NOTE — Telephone Encounter (Signed)
Faxed clearance to [facility], [phone]

## 2015-07-02 NOTE — Telephone Encounter (Signed)
Results have not yet been received.

## 2015-07-05 DIAGNOSIS — H25812 Combined forms of age-related cataract, left eye: Secondary | ICD-10-CM | POA: Diagnosis not present

## 2015-07-05 DIAGNOSIS — H2512 Age-related nuclear cataract, left eye: Secondary | ICD-10-CM | POA: Diagnosis not present

## 2015-07-05 NOTE — Telephone Encounter (Signed)
Results placed on Dr. Bary Leriche desk for review. Will fax once I receive them back.

## 2015-07-05 NOTE — Telephone Encounter (Signed)
Sent message to Dr. Ellyn Hack office

## 2015-07-05 NOTE — Telephone Encounter (Signed)
Have not received results from carotid ultrasound.  It appears the test has not been done.  Needs to have asap.

## 2015-07-09 NOTE — Telephone Encounter (Signed)
Good afternoon Donna Rota,  [name] is scheduled for carotid US April 18. Please let me know if you have any other questions.   Ivin Booty

## 2015-07-09 NOTE — Telephone Encounter (Signed)
Please notify [facility] of appt date.  Is this date ok with them.  If not, will need to have Melissa see if can schedule earlier.

## 2015-07-09 NOTE — Telephone Encounter (Signed)
Faxed note to [place] with info mentioned below

## 2015-07-11 NOTE — Telephone Encounter (Signed)
OK -- will make note.  Leonie Man, MD

## 2015-07-30 ENCOUNTER — Ambulatory Visit: Payer: Medicare Other

## 2015-07-30 ENCOUNTER — Other Ambulatory Visit: Payer: Self-pay | Admitting: Cardiology

## 2015-07-30 DIAGNOSIS — H34219 Partial retinal artery occlusion, unspecified eye: Secondary | ICD-10-CM

## 2015-07-30 DIAGNOSIS — H3589 Other specified retinal disorders: Secondary | ICD-10-CM | POA: Diagnosis not present

## 2015-08-09 ENCOUNTER — Ambulatory Visit (INDEPENDENT_AMBULATORY_CARE_PROVIDER_SITE_OTHER): Payer: Medicare Other | Admitting: Family Medicine

## 2015-08-09 ENCOUNTER — Ambulatory Visit: Payer: Federal, State, Local not specified - PPO | Admitting: Family Medicine

## 2015-08-09 ENCOUNTER — Encounter: Payer: Self-pay | Admitting: Family Medicine

## 2015-08-09 VITALS — BP 120/62 | HR 83 | Temp 98.3°F | Ht 62.0 in | Wt 140.2 lb

## 2015-08-09 DIAGNOSIS — J988 Other specified respiratory disorders: Principal | ICD-10-CM

## 2015-08-09 DIAGNOSIS — B349 Viral infection, unspecified: Secondary | ICD-10-CM

## 2015-08-09 DIAGNOSIS — B9789 Other viral agents as the cause of diseases classified elsewhere: Secondary | ICD-10-CM | POA: Insufficient documentation

## 2015-08-09 MED ORDER — HYDROCOD POLST-CPM POLST ER 10-8 MG/5ML PO SUER: 5.0000 mL | Freq: Two times a day (BID) | ORAL | Status: DC | PRN

## 2015-08-09 NOTE — Progress Notes (Signed)
Pre visit review using our clinic review tool, if applicable. No additional management support is needed unless otherwise documented below in the visit note. 

## 2015-08-09 NOTE — Progress Notes (Signed)
   Subjective:  [name], female    DOB: [birth year]  Age: 75 y.o. MRN: [id number]  CC: Cough  HPI:  75 year old female presents with complaints of cough.  Patient reports that she's had a nonproductive cough since Monday. She reports associated body aches and chills. No associated fever. No known exacerbating or relieving factors. No associated shortness of breath. She states that she is slightly better today. No other reported URI symptoms. No other complaints today.  Social Hx   Social History   Social History  . Marital Status: Married    Spouse Name: N/A  . Number of Children: 3  . Years of Education: N/A   Occupational History  .  Other   Social History Main Topics  . Smoking status: Never Smoker   . Smokeless tobacco: Never Used  . Alcohol Use: 0.0 oz/week    0 Standard drinks or equivalent per week  . Drug Use: No  . Sexual Activity: No   Other Topics Concern  . None   Social History Narrative   Recently widowed. Her husband died of a heart attack in 04/23/2015   Review of Systems  Constitutional: Positive for chills. Negative for fever.  Respiratory: Positive for cough.   Musculoskeletal:       Body aches.   Objective:  BP 120/62 mmHg  Pulse 83  Temp(Src) 98.3 F (36.8 C) (Oral)  Ht 5\' 2"  (1.575 m)  Wt 140 lb 4 oz (63.617 kg)  BMI 25.65 kg/m2  SpO2 96%  LMP 04/28/1992  BP/Weight 08/09/2015 123XX123 123XX123  Systolic BP 123456 123456 0000000  Diastolic BP 62 80 80  Wt. (Lbs) 140.25 142 145.5  BMI 25.65 25.97 26.61   Physical Exam  Constitutional: She is oriented to person, place, and time. She appears well-developed. No distress.  HENT:  Mouth/Throat: Oropharynx is clear and moist.  Normal TM's bilaterally.  Cardiovascular: Normal rate and regular rhythm.   Pulmonary/Chest: Effort normal and breath sounds normal. She has no wheezes. She has no rales.  Neurological: She is alert and oriented to person, place, and time.  Psychiatric: She  has a normal mood and affect.  Vitals reviewed.  Lab Results  Component Value Date   WBC 6.6 06/08/2015   HGB 15.2* 06/08/2015   HCT 44.7 06/08/2015   PLT 246.0 06/08/2015   GLUCOSE 101* 06/08/2015   CHOL 242* 06/08/2015   TRIG 117.0 06/08/2015   HDL 58.80 06/08/2015   LDLCALC 160* 06/08/2015   ALT 17 06/08/2015   AST 22 06/08/2015   NA 141 06/08/2015   K 4.3 06/08/2015   CL 104 06/08/2015   CREATININE 0.76 06/08/2015   BUN 17 06/08/2015   CO2 28 06/08/2015   TSH 1.21 06/08/2015   HGBA1C 6.0 06/08/2015   Assessment & Plan:   Problem List Items Addressed This Visit    Viral respiratory infection - Primary    New acute problem. No indication for antibiotics. Treating with tussionex. Return precautions given.         Meds ordered this encounter  Medications  . chlorpheniramine-HYDROcodone (TUSSIONEX PENNKINETIC ER) 10-8 MG/5ML SUER    Sig: Take 5 mLs by mouth every 12 (twelve) hours as needed.    Dispense:  115 mL    Refill:  0    Follow-up: PRN  [place]

## 2015-08-09 NOTE — Patient Instructions (Signed)
You have a viral infection.  Your exam was normal.  No need for antibiotics at this time.  Use the cough medication as directed.  Return if you develop fever, SOB, or other worsening symptoms.  Take care  Dr. Lacinda Axon

## 2015-08-09 NOTE — Assessment & Plan Note (Signed)
New acute problem. No indication for antibiotics. Treating with tussionex. Return precautions given.

## 2015-08-20 ENCOUNTER — Ambulatory Visit (INDEPENDENT_AMBULATORY_CARE_PROVIDER_SITE_OTHER): Payer: Medicare Other | Admitting: Internal Medicine

## 2015-08-20 ENCOUNTER — Telehealth: Payer: Self-pay | Admitting: Internal Medicine

## 2015-08-20 ENCOUNTER — Encounter: Payer: Self-pay | Admitting: Internal Medicine

## 2015-08-20 ENCOUNTER — Ambulatory Visit
Admission: RE | Admit: 2015-08-20 | Discharge: 2015-08-20 | Disposition: A | Discharge status: Home / Self Care | Payer: Medicare Other | Source: Ambulatory Visit | Attending: Internal Medicine | Admitting: Internal Medicine

## 2015-08-20 VITALS — BP 124/80 | HR 86 | Temp 98.2°F | Resp 18 | Ht 61.1 in | Wt 136.2 lb

## 2015-08-20 DIAGNOSIS — R059 Cough, unspecified: Secondary | ICD-10-CM

## 2015-08-20 DIAGNOSIS — R05 Cough: Secondary | ICD-10-CM | POA: Diagnosis not present

## 2015-08-20 DIAGNOSIS — E7800 Pure hypercholesterolemia, unspecified: Secondary | ICD-10-CM

## 2015-08-20 DIAGNOSIS — Z0000 Encounter for general adult medical examination without abnormal findings: Secondary | ICD-10-CM | POA: Diagnosis not present

## 2015-08-20 DIAGNOSIS — R739 Hyperglycemia, unspecified: Secondary | ICD-10-CM

## 2015-08-20 DIAGNOSIS — I10 Essential (primary) hypertension: Secondary | ICD-10-CM

## 2015-08-20 DIAGNOSIS — Z658 Other specified problems related to psychosocial circumstances: Secondary | ICD-10-CM

## 2015-08-20 DIAGNOSIS — F439 Reaction to severe stress, unspecified: Secondary | ICD-10-CM

## 2015-08-20 MED ORDER — PREDNISONE 10 MG PO TABS: ORAL_TABLET | ORAL | Status: DC

## 2015-08-20 NOTE — Assessment & Plan Note (Signed)
Physical today 08/20/15.  Colonoscopy 02/17/06.  Recommended f/u in 2017.   Mammogram 05/14/15 - Birads I.

## 2015-08-20 NOTE — Telephone Encounter (Signed)
Patient was seen today, please advise for cholesterol lab work to be ordered.  Thanks

## 2015-08-20 NOTE — Telephone Encounter (Signed)
Please schedule fasting labs 1-2 days before f/u appt on 11/09/15 and notify pt.  Thanks

## 2015-08-20 NOTE — Patient Instructions (Signed)
Saline nasal spray - flush nose at least 2-3x/day  nasacort nasal spray - 2 sprays each nostril one time per day.  Do this in the evening.  

## 2015-08-20 NOTE — Progress Notes (Signed)
Pre-visit discussion using our clinic review tool. No additional management support is needed unless otherwise documented below in the visit note.  

## 2015-08-20 NOTE — Telephone Encounter (Signed)
Pt was checking out stating  Dr Nicki Reaper wants her to come in to get her cholesterol checked. Need order. Please and thank you! Call pt @ [phone].

## 2015-08-20 NOTE — Progress Notes (Addendum)
[name], female   DOB: Dec 21, 1940, 75 y.o.   MRN: [id number]   Subjective:    [name], female    DOB: [birth year], 75 y.o.   MRN: [id number]  HPI  Patient with past history of hypercholesterolemia, GERD, allergies and hypertension.  She comes in today to follow up on these issues.  She feels she is handling stress relatively well.  Coping well with husband's death.  Tries to stay active.  No cardiac symptoms with increased activity or exertion.  Reports some increased drainage.  Some cough related.  Occasional productive cough.  No chest congestion.  Cough persistent.   No acid reflux.  No abdominal pain or cramping.  She is still concerned regarding her tongue. Some decreased taste.  Has tried Dukes.  Feels needs something more.  Bowels stable.     Past Medical History  Diagnosis Date  . Hypertension     Borderline. She does not take the 12.5 HCTZ daily  . Hypercholesterolemia     By last lipid panel  . Allergy   . GERD (gastroesophageal reflux disease)   . Carpal tunnel syndrome 1999   Past Surgical History  Procedure Laterality Date  . Appendectomy    . Tonsillectomy    . Eye surgery      left  . Carpal tunnel release      right hand   Family History  Problem Relation Age of Onset  . Diabetes Mother   . Hypertension Brother   . Diabetes Maternal Grandmother   . Breast cancer Neg Hx   . Colon cancer Neg Hx    Social History   Social History  . Marital Status: Married    Spouse Name: N/A  . Number of Children: 3  . Years of Education: N/A   Occupational History  .  Other   Social History Main Topics  . Smoking status: Never Smoker   . Smokeless tobacco: Never Used  . Alcohol Use: 0.0 oz/week    0 Standard drinks or equivalent per week  . Drug Use: No  . Sexual Activity: No   Other Topics Concern  . None   Social History Narrative   Recently widowed. Her husband died of a heart attack in May 15, 2015    Outpatient Encounter  Prescriptions as of 08/20/2015  Medication Sig  . ALOE VERA PO Take 1 tablet by mouth daily.   Marland Kitchen aspirin (ASPIR-81) 81 MG EC tablet Take 81 mg by mouth daily. Swallow whole.  . chlorpheniramine-HYDROcodone (TUSSIONEX PENNKINETIC ER) 10-8 MG/5ML SUER Take 5 mLs by mouth every 12 (twelve) hours as needed.  . Cholecalciferol (VITAMIN D3) 1000 UNITS CAPS Take 1,000 Units by mouth 2 (two) times daily.   . CYANOCOBALAMIN PO Take 1 tablet by mouth daily.   . hydrochlorothiazide (HYDRODIURIL) 12.5 MG tablet Take 12.5 mg by mouth as needed (as needed for edema and hypertension).   Marland Kitchen MAGNESIUM CITRATE PO Take 1 tablet by mouth daily.   . Omega-3 Fatty Acids (FISH OIL) 1000 MG CAPS Take 1,000 mg by mouth 3 (three) times daily.   Marland Kitchen OVER THE COUNTER MEDICATION Take 1 tablet by mouth daily. Hair supplement  . Triamcinolone Acetonide (NASACORT AQ NA) Place 1 spray into both nostrils daily.   . Turmeric 500 MG CAPS Take 1 tablet by mouth daily.   . vitamin C (ASCORBIC ACID) 500 MG tablet Take 500 mg by mouth 2 (two) times daily.  . [DISCONTINUED] clonazePAM (KLONOPIN)  0.5 MG tablet TAKE ONE TABLET DAILY AS NEEDED. (Patient taking differently: TAKE ONE TABLET by mouth DAILY AS NEEDED for anxiety)  . predniSONE (DELTASONE) 10 MG tablet Take 6 tablets x 1 day and then decrease by 1/2 tablet per day until down to zero mg.   No facility-administered encounter medications on file as of 08/20/2015.    Review of Systems  Constitutional: Negative for appetite change and unexpected weight change.  HENT: Positive for congestion and postnasal drip. Negative for sinus pressure.   Eyes: Negative for pain and visual disturbance.  Respiratory: Positive for cough. Negative for chest tightness and shortness of breath.   Cardiovascular: Negative for chest pain, palpitations and leg swelling.  Gastrointestinal: Negative for nausea, vomiting, abdominal pain and diarrhea.  Genitourinary: Negative for dysuria and difficulty  urinating.  Musculoskeletal: Negative for back pain and joint swelling.  Skin: Negative for color change and rash.  Neurological: Negative for dizziness, light-headedness and headaches.  Hematological: Negative for adenopathy. Does not bruise/bleed easily.  Psychiatric/Behavioral: Negative for dysphoric mood and agitation.       Objective:    Physical Exam  Constitutional: She is oriented to person, place, and time. She appears well-developed and well-nourished. No distress.  HENT:  Nose: Nose normal.  Mouth/Throat: Oropharynx is clear and moist.  Eyes: Right eye exhibits no discharge. Left eye exhibits no discharge. No scleral icterus.  Neck: Neck supple. No thyromegaly present.  Cardiovascular: Normal rate and regular rhythm.   Pulmonary/Chest: Breath sounds normal. No accessory muscle usage. No tachypnea. No respiratory distress. She has no decreased breath sounds. She has no wheezes. She has no rhonchi. Right breast exhibits no inverted nipple, no mass, no nipple discharge and no tenderness (no axillary adenopathy). Left breast exhibits no inverted nipple, no mass, no nipple discharge and no tenderness (no axilarry adenopathy).  Increased cough with forced expiration.   Abdominal: Soft. Bowel sounds are normal. There is no tenderness.  Musculoskeletal: She exhibits no edema or tenderness.  Lymphadenopathy:    She has no cervical adenopathy.  Neurological: She is alert and oriented to person, place, and time.  Skin: Skin is warm. No rash noted. No erythema.  Psychiatric: She has a normal mood and affect. Her behavior is normal.    BP 124/80 mmHg  Pulse 86  Temp(Src) 98.2 F (36.8 C) (Oral)  Resp 18  Ht 5' 1.1" (1.552 m)  Wt 136 lb 4 oz (61.803 kg)  BMI 25.66 kg/m2  SpO2 96%  LMP 04/28/1992 Wt Readings from Last 3 Encounters:  08/20/15 136 lb 4 oz (61.803 kg)  08/09/15 140 lb 4 oz (63.617 kg)  06/20/15 142 lb (64.411 kg)     Lab Results  Component Value Date   WBC  6.6 06/08/2015   HGB 15.2* 06/08/2015   HCT 44.7 06/08/2015   PLT 246.0 06/08/2015   GLUCOSE 101* 06/08/2015   CHOL 242* 06/08/2015   TRIG 117.0 06/08/2015   HDL 58.80 06/08/2015   LDLCALC 160* 06/08/2015   ALT 17 06/08/2015   AST 22 06/08/2015   NA 141 06/08/2015   K 4.3 06/08/2015   CL 104 06/08/2015   CREATININE 0.76 06/08/2015   BUN 17 06/08/2015   CO2 28 06/08/2015   TSH 1.21 06/08/2015   HGBA1C 6.0 06/08/2015       Assessment & Plan:   Problem List Items Addressed This Visit    Cough    Persistent with increased congestion as outlined.  Do not feel abx warranted.  Treat with prednisone taper.  Saline nasal spray and nasacort nasal spray as outlined.  Check cxr.  Follow.        Relevant Orders   DG Chest 2 View (Completed)   Health care maintenance - Primary    Physical today 08/20/15.  Colonoscopy 02/17/06.  Recommended f/u in 2017.   Mammogram 05/14/15 - Birads I.       Hypercholesterolemia    Low cholesterol diet and exercise.  Given recent finding of Hollenhorst plaque - need to be more strict about goal LDL.  Follow lipid panel.  Has declined cholesterol medication previously.        Relevant Orders   Lipid panel   Hepatic function panel   Hyperglycemia    Low carb diet and exercise.  Follow met b and a1c.       Relevant Orders   Hemoglobin A1c   Hypertension    Blood pressure under good control.  Continue same medication regimen.  Follow pressures.  Follow metabolic panel.        Relevant Orders   CBC with Differential/Platelet   Basic metabolic panel   Stress    Feels she is handling things relatively well.  Follow.           [name], MD

## 2015-08-29 ENCOUNTER — Other Ambulatory Visit: Payer: Self-pay | Admitting: Internal Medicine

## 2015-08-29 NOTE — Telephone Encounter (Signed)
Okay to refill? Pt was seen this month.

## 2015-08-30 NOTE — Telephone Encounter (Signed)
ok'd refill clonazepam #30 with no refills.   

## 2015-08-30 NOTE — Telephone Encounter (Signed)
Rx faxed

## 2015-09-02 ENCOUNTER — Encounter: Payer: Self-pay | Admitting: Internal Medicine

## 2015-09-02 NOTE — Assessment & Plan Note (Signed)
Feels she is handling things relatively well.  Follow.   

## 2015-09-02 NOTE — Assessment & Plan Note (Addendum)
Low cholesterol diet and exercise.  Given recent finding of Hollenhorst plaque - need to be more strict about goal LDL.  Follow lipid panel.  Has declined cholesterol medication previously.

## 2015-09-02 NOTE — Assessment & Plan Note (Signed)
Persistent with increased congestion as outlined.  Do not feel abx warranted.  Treat with prednisone taper.  Saline nasal spray and nasacort nasal spray as outlined.  Check cxr.  Follow.

## 2015-09-02 NOTE — Addendum Note (Signed)
Addended by: Alisa Graff on: 09/02/2015 02:52 PM   Modules accepted: Orders, SmartSet

## 2015-09-02 NOTE — Assessment & Plan Note (Signed)
Blood pressure under good control.  Continue same medication regimen.  Follow pressures.  Follow metabolic panel.   

## 2015-09-02 NOTE — Assessment & Plan Note (Signed)
Low carb diet and exercise.  Follow met b and a1c.  

## 2015-10-24 ENCOUNTER — Other Ambulatory Visit: Payer: Self-pay | Admitting: Internal Medicine

## 2015-10-26 NOTE — Telephone Encounter (Signed)
ok'd refill clonazepam #30 with no refills.   

## 2015-10-29 ENCOUNTER — Other Ambulatory Visit: Payer: Self-pay | Admitting: Internal Medicine

## 2015-11-02 ENCOUNTER — Other Ambulatory Visit: Payer: Self-pay | Admitting: Internal Medicine

## 2015-11-02 NOTE — Telephone Encounter (Signed)
Last OV: 08/20/15 & next OV: 11/09/15 Okay to refill?

## 2015-11-05 ENCOUNTER — Other Ambulatory Visit (INDEPENDENT_AMBULATORY_CARE_PROVIDER_SITE_OTHER): Payer: Medicare Other

## 2015-11-05 ENCOUNTER — Ambulatory Visit: Payer: Federal, State, Local not specified - PPO

## 2015-11-05 DIAGNOSIS — R739 Hyperglycemia, unspecified: Secondary | ICD-10-CM

## 2015-11-05 DIAGNOSIS — E7800 Pure hypercholesterolemia, unspecified: Secondary | ICD-10-CM

## 2015-11-05 DIAGNOSIS — I10 Essential (primary) hypertension: Secondary | ICD-10-CM

## 2015-11-05 LAB — LIPID PANEL
CHOL/HDL RATIO: 3
Cholesterol: 212 mg/dL — ABNORMAL HIGH (ref 0–200)
HDL: 62.5 mg/dL (ref >39.00)
LDL Cholesterol: 132 mg/dL — ABNORMAL HIGH (ref 0–99)
NONHDL: 149.99
Triglycerides: 88 mg/dL (ref 0.0–149.0)
VLDL: 17.6 mg/dL (ref 0.0–40.0)

## 2015-11-05 LAB — BASIC METABOLIC PANEL
BUN: 13 mg/dL (ref 6–23)
CHLORIDE: 106 meq/L (ref 96–112)
CO2: 29 meq/L (ref 19–32)
CREATININE: 0.9 mg/dL (ref 0.40–1.20)
Calcium: 9.9 mg/dL (ref 8.4–10.5)
GFR: 65.28 mL/min (ref >60.00)
GLUCOSE: 92 mg/dL (ref 70–99)
POTASSIUM: 4.9 meq/L (ref 3.5–5.1)
Sodium: 141 mEq/L (ref 135–145)

## 2015-11-05 LAB — CBC WITH DIFFERENTIAL/PLATELET
BASOS ABS: 0 10*3/uL (ref 0.0–0.1)
BASOS PCT: 0.7 % (ref 0.0–3.0)
Eosinophils Absolute: 0.1 10*3/uL (ref 0.0–0.7)
Eosinophils Relative: 2 % (ref 0.0–5.0)
HEMATOCRIT: 43.1 % (ref 36.0–46.0)
Hemoglobin: 14.4 g/dL (ref 12.0–15.0)
LYMPHS ABS: 2.2 10*3/uL (ref 0.7–4.0)
LYMPHS PCT: 32.3 % (ref 12.0–46.0)
MCHC: 33.4 g/dL (ref 30.0–36.0)
MCV: 87.7 fl (ref 78.0–100.0)
MONOS PCT: 9.8 % (ref 3.0–12.0)
Monocytes Absolute: 0.7 10*3/uL (ref 0.1–1.0)
NEUTROS ABS: 3.7 10*3/uL (ref 1.4–7.7)
NEUTROS PCT: 55.2 % (ref 43.0–77.0)
PLATELETS: 248 10*3/uL (ref 150.0–400.0)
RBC: 4.91 Mil/uL (ref 3.87–5.11)
RDW: 14 % (ref 11.5–15.5)
WBC: 6.7 10*3/uL (ref 4.0–10.5)

## 2015-11-05 LAB — HEPATIC FUNCTION PANEL
ALBUMIN: 4.2 g/dL (ref 3.5–5.2)
ALK PHOS: 80 U/L (ref 39–117)
ALT: 16 U/L (ref 0–35)
AST: 19 U/L (ref 0–37)
BILIRUBIN DIRECT: 0.1 mg/dL (ref 0.0–0.3)
BILIRUBIN TOTAL: 0.7 mg/dL (ref 0.2–1.2)
Total Protein: 7.2 g/dL (ref 6.0–8.3)

## 2015-11-05 LAB — HEMOGLOBIN A1C: Hgb A1c MFr Bld: 5.6 % (ref 4.6–6.5)

## 2015-11-05 NOTE — Telephone Encounter (Signed)
Rx faxed to [facility].  

## 2015-11-07 ENCOUNTER — Ambulatory Visit (INDEPENDENT_AMBULATORY_CARE_PROVIDER_SITE_OTHER): Payer: Medicare Other

## 2015-11-07 VITALS — BP 128/70 | HR 76 | Temp 98.1°F | Resp 12 | Ht 61.0 in | Wt 134.8 lb

## 2015-11-07 DIAGNOSIS — Z0000 Encounter for general adult medical examination without abnormal findings: Secondary | ICD-10-CM

## 2015-11-07 DIAGNOSIS — Z23 Encounter for immunization: Secondary | ICD-10-CM

## 2015-11-07 NOTE — Progress Notes (Signed)
Subjective:   Donna Lara is a 75 y.o. female who presents for Medicare Annual (Subsequent) preventive examination.  Review of Systems:  No ROS.  Medicare Wellness Visit.  Cardiac Risk Factors include: advanced age (>75men, >51 women);hypertension     Objective:     Vitals: BP 128/70 (BP Location: Right Arm, Patient Position: Sitting, Cuff Size: Normal)   Pulse 76   Temp 98.1 F (36.7 C) (Oral)   Resp 12   Ht 5\' 1"  (1.549 m)   Wt 134 lb 12.8 oz (61.1 kg)   LMP 04/28/1992   SpO2 98%   BMI 25.47 kg/m   Body mass index is 25.47 kg/m.   Tobacco History  Smoking Status  . Never Smoker  Smokeless Tobacco  . Never Used     Counseling given: Not Answered   Past Medical History:  Diagnosis Date  . Allergy   . Carpal tunnel syndrome 1999  . GERD (gastroesophageal reflux disease)   . Hypercholesterolemia    By last lipid panel  . Hypertension    Borderline. She does not take the 12.5 HCTZ daily   Past Surgical History:  Procedure Laterality Date  . APPENDECTOMY    . CARPAL TUNNEL RELEASE     right hand  . EYE SURGERY     left  . TONSILLECTOMY     Family History  Problem Relation Age of Onset  . Diabetes Mother   . Hypertension Brother   . Diabetes Maternal Grandmother   . Breast cancer Neg Hx   . Colon cancer Neg Hx    History  Sexual Activity  . Sexual activity: No    Outpatient Encounter Prescriptions as of 11/07/2015  Medication Sig  . ALOE VERA PO Take 1 tablet by mouth daily.   Marland Kitchen aspirin (ASPIR-81) 81 MG EC tablet Take 81 mg by mouth daily. Swallow whole.  . chlorpheniramine-HYDROcodone (TUSSIONEX PENNKINETIC ER) 10-8 MG/5ML SUER Take 5 mLs by mouth every 12 (twelve) hours as needed.  . Cholecalciferol (VITAMIN D3) 1000 UNITS CAPS Take 1,000 Units by mouth 2 (two) times daily.   . clonazePAM (KLONOPIN) 0.5 MG tablet TAKE ONE TABLET DAILY AS NEEDED.  Marland Kitchen CYANOCOBALAMIN PO Take 1 tablet by mouth daily.   . hydrochlorothiazide (HYDRODIURIL) 12.5 MG  tablet Take 12.5 mg by mouth as needed (as needed for edema and hypertension).   Marland Kitchen MAGNESIUM CITRATE PO Take 1 tablet by mouth daily.   . Omega-3 Fatty Acids (FISH OIL) 1000 MG CAPS Take 1,000 mg by mouth 3 (three) times daily.   Marland Kitchen OVER THE COUNTER MEDICATION Take 1 tablet by mouth daily. Hair supplement  . predniSONE (DELTASONE) 10 MG tablet Take 6 tablets x 1 day and then decrease by 1/2 tablet per day until down to zero mg.  . Triamcinolone Acetonide (NASACORT AQ NA) Place 1 spray into both nostrils daily.   . Turmeric 500 MG CAPS Take 1 tablet by mouth daily.   . vitamin C (ASCORBIC ACID) 500 MG tablet Take 500 mg by mouth 2 (two) times daily.   No facility-administered encounter medications on file as of 11/07/2015.     Activities of Daily Living In your present state of health, do you have any difficulty performing the following activities: 11/07/2015  Hearing? N  Vision? N  Difficulty concentrating or making decisions? N  Walking or climbing stairs? N  Dressing or bathing? N  Doing errands, shopping? N  Preparing Food and eating ? N  Using the Toilet? N  In the past six months, have you accidently leaked urine? N  Do you have problems with loss of bowel control? N  Managing your Medications? N  Managing your Finances? N  Housekeeping or managing your Housekeeping? N  Some recent data might be hidden    Patient Care Team: [name], MD as PCP - General (Internal Medicine)    Assessment:    This is a routine wellness examination for Donna Lara. The goal of the wellness visit is to assist the patient how to close the gaps in care and create a preventative care plan for the patient.   Taking calcium VIT D3 as appropriate/Osteoporosis risk reviewed.  Medications reviewed; taking without issues or barriers.  Safety issues reviewed; smoke and carbon monoxide detectors in the home. Alarm system in the home.  No firearms in the home. Wears seatbelts when driving or riding with  others. No violence in the home.  No identified risk were noted; The patient was oriented x 3; appropriate in dress and manner and no objective failures at ADL's or IADL's.   There is no risks for hepatitis, STDs or HIV. There is no history of blood transfusion. They have no travel history to infectious disease endemic areas of the world.  Body mass index; discussed the importance of a healthy diet, water intake and exercise. Educational material provided.   ZOSTAVAX vaccine Neurosurgeon given.  Postponed for follow up with insurance.   Prevnar 13 vaccine administered, R deltoid.  Tolerated well.  Educational material provided.     Patient Concerns: Need for TDAP.  Educational material provided.  Aware of insurance coverage.  Follow up with PCP at upcoming visit, per patient request.  Exercise Activities and Dietary recommendations Current Exercise Habits: Home exercise routine (Uses elyptical and dances), Type of exercise: calisthenics, Time (Minutes): 15, Frequency (Times/Week): 2, Weekly Exercise (Minutes/Week): 30, Intensity: Mild  Goals    . Healthy Lifestyle          STAY HYDRATED AND DRINK PLENTY OF WATER.   LOW CARB FOODS. STAY ACTIVE AND CONTINUE EXERCISING.    . Increase physical activity          Tone body by lifting weights, walking, and using the exercise machine (Nordic Track) at home for a minimum of 3x a week for 30 minute sessions.      Fall Risk Fall Risk  11/07/2015 11/02/2014 07/21/2014 10/16/2012 05/03/2012  Falls in the past year? No No No No No   Depression Screen PHQ 2/9 Scores 11/07/2015 11/02/2014 07/21/2014 10/16/2012  PHQ - 2 Score 0 0 0 0    PHQ 2/9 scores, no change.  Handling the recent death of spouse okay.  Staying active with family and friends.  Cognitive Testing MMSE - Mini Mental State Exam 11/07/2015 11/02/2014  Orientation to time 5 5  Orientation to Place 5 5  Registration 3 3  Attention/ Calculation 5 5  Recall 3 3  Language- name  2 objects 2 2  Language- repeat 1 1  Language- follow 3 step command 3 3  Language- read & follow direction 1 1  Write a sentence 1 1  Copy design 1 1  Total score 30 30    Immunization History  Administered Date(s) Administered  . Pneumococcal Conjugate-13 11/07/2015   Screening Tests Health Maintenance  Topic Date Due  . TETANUS/TDAP  02/10/1962  . ZOSTAVAX  02/11/2003  . INFLUENZA VACCINE  11/06/2015  . COLONOSCOPY  02/18/2016  . PNA vac Low Risk Adult (  2 of 2 - PPSV23) 11/06/2016  . MAMMOGRAM  05/13/2017  . DEXA SCAN  Completed      Plan:   End of life planning; Advance aging; Advanced directives discussed. HCPOA/Living Will short forms deferred, per patient request.  During the course of the visit the patient was educated and counseled about the following appropriate screening and preventive services:   Vaccines to include Pneumoccal, Influenza, Hepatitis B, Td, Zostavax, HCV  Electrocardiogram  Cardiovascular Disease  Colorectal cancer screening  Bone density screening  Diabetes screening  Glaucoma screening  Mammography/PAP  Nutrition counseling   Patient Instructions (the written plan) was given to the patient.   Varney Biles, LPN  [id number]   Reviewed above information.  Agree with plan.   Dr Nicki Reaper

## 2015-11-07 NOTE — Patient Instructions (Addendum)
  Donna Lara , Thank you for taking time to come for your Medicare Wellness Visit. I appreciate your ongoing commitment to your health goals. Please review the following plan we discussed and let me know if I can assist you in the future.   These are the goals we discussed: Goals    . Healthy Lifestyle          STAY HYDRATED AND DRINK PLENTY OF WATER.   LOW CARB FOODS. STAY ACTIVE AND CONTINUE EXERCISING.    . Increase physical activity          Tone body by lifting weights, walking, and using the exercise machine (Nordic Track) at home for a minimum of 3x a week for 30 minute sessions.       This is a list of the screening recommended for you and due dates:  Health Maintenance  Topic Date Due  . Tetanus Vaccine  02/10/1962  . Shingles Vaccine  02/11/2003  . Flu Shot  11/06/2015  . Colon Cancer Screening  02/18/2016  . Pneumonia vaccines (2 of 2 - PPSV23) 11/06/2016  . Mammogram  05/13/2017  . DEXA scan (bone density measurement)  Completed

## 2015-11-09 ENCOUNTER — Encounter: Payer: Self-pay | Admitting: Internal Medicine

## 2015-11-09 ENCOUNTER — Ambulatory Visit (INDEPENDENT_AMBULATORY_CARE_PROVIDER_SITE_OTHER): Payer: Medicare Other | Admitting: Internal Medicine

## 2015-11-09 VITALS — BP 128/76 | HR 62 | Temp 97.7°F | Resp 16 | Wt 134.0 lb

## 2015-11-09 DIAGNOSIS — I10 Essential (primary) hypertension: Secondary | ICD-10-CM

## 2015-11-09 DIAGNOSIS — E7800 Pure hypercholesterolemia, unspecified: Secondary | ICD-10-CM

## 2015-11-09 DIAGNOSIS — H34219 Partial retinal artery occlusion, unspecified eye: Secondary | ICD-10-CM

## 2015-11-09 DIAGNOSIS — F439 Reaction to severe stress, unspecified: Secondary | ICD-10-CM

## 2015-11-09 DIAGNOSIS — H3589 Other specified retinal disorders: Secondary | ICD-10-CM

## 2015-11-09 DIAGNOSIS — R739 Hyperglycemia, unspecified: Secondary | ICD-10-CM | POA: Diagnosis not present

## 2015-11-09 DIAGNOSIS — Z658 Other specified problems related to psychosocial circumstances: Secondary | ICD-10-CM | POA: Diagnosis not present

## 2015-11-09 MED ORDER — ROSUVASTATIN CALCIUM 5 MG PO TABS: 5.0000 mg | ORAL_TABLET | Freq: Every day | ORAL | 1 refills | Status: DC

## 2015-11-09 NOTE — Progress Notes (Signed)
Pre visit review using our clinic review tool, if applicable. No additional management support is needed unless otherwise documented below in the visit note. 

## 2015-11-09 NOTE — Progress Notes (Signed)
[name], female   DOB: 08-06-1940, 75 y.o.   MRN: [id number]   Subjective:    [name], female    DOB: 10-28-1940, 75 y.o.   MRN: [id number]  HPI  Patient here for a scheduled follow up.  States she is doing relatively well.  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  Still coping with her husband's death.  Overall doing well.  Tries to stay active.  No cardiac symptoms with increased activity or exertion.  No sob.  No acid reflux.  No abdominal pain or cramping.  Bowels stable.  Discussed her lab results today.  Discussed cholesterol levels.  Discussed medication, especially given her history.     Past Medical History:  Diagnosis Date  . Allergy   . Carpal tunnel syndrome 1999  . GERD (gastroesophageal reflux disease)   . Hypercholesterolemia    By last lipid panel  . Hypertension    Borderline. She does not take the 12.5 HCTZ daily   Past Surgical History:  Procedure Laterality Date  . APPENDECTOMY    . CARPAL TUNNEL RELEASE     right hand  . EYE SURGERY     left  . TONSILLECTOMY     Family History  Problem Relation Age of Onset  . Diabetes Mother   . Hypertension Brother   . Diabetes Maternal Grandmother   . Breast cancer Neg Hx   . Colon cancer Neg Hx    Social History   Social History  . Marital status: Married    Spouse name: N/A  . Number of children: 3  . Years of education: N/A   Occupational History  .  Other   Social History Main Topics  . Smoking status: Never Smoker  . Smokeless tobacco: Never Used  . Alcohol use 0.0 oz/week  . Drug use: No  . Sexual activity: No   Other Topics Concern  . None   Social History Narrative   Recently widowed. Her husband died of a heart attack in May 17, 2015    Outpatient Encounter Prescriptions as of 11/09/2015  Medication Sig  . ALOE VERA PO Take 1 tablet by mouth daily.   Marland Kitchen aspirin (ASPIR-81) 81 MG EC tablet Take 81 mg by mouth daily. Swallow whole.  . clonazePAM (KLONOPIN)  0.5 MG tablet TAKE ONE TABLET DAILY AS NEEDED.  Marland Kitchen CYANOCOBALAMIN PO Take 1 tablet by mouth daily.   Marland Kitchen MAGNESIUM CITRATE PO Take 1 tablet by mouth daily.   . Omega-3 Fatty Acids (FISH OIL) 1000 MG CAPS Take 1,000 mg by mouth 3 (three) times daily.   Marland Kitchen OVER THE COUNTER MEDICATION Take 1 tablet by mouth daily. Hair supplement  . Triamcinolone Acetonide (NASACORT AQ NA) Place 1 spray into both nostrils daily.   . vitamin C (ASCORBIC ACID) 500 MG tablet Take 500 mg by mouth 2 (two) times daily.  . Cholecalciferol (VITAMIN D3) 1000 UNITS CAPS Take 1,000 Units by mouth 2 (two) times daily.   . hydrochlorothiazide (HYDRODIURIL) 12.5 MG tablet Take 12.5 mg by mouth as needed (as needed for edema and hypertension).   . rosuvastatin (CRESTOR) 5 MG tablet Take 1 tablet (5 mg total) by mouth daily.  . [DISCONTINUED] chlorpheniramine-HYDROcodone (TUSSIONEX PENNKINETIC ER) 10-8 MG/5ML SUER Take 5 mLs by mouth every 12 (twelve) hours as needed.  . [DISCONTINUED] predniSONE (DELTASONE) 10 MG tablet Take 6 tablets x 1 day and then decrease by 1/2 tablet per day until down  to zero mg.  . [DISCONTINUED] Turmeric 500 MG CAPS Take 1 tablet by mouth daily.    No facility-administered encounter medications on file as of 11/09/2015.     Review of Systems  Constitutional: Negative for appetite change and unexpected weight change.  HENT: Negative for congestion and sinus pressure.   Respiratory: Negative for cough, chest tightness and shortness of breath.   Cardiovascular: Negative for chest pain, palpitations and leg swelling.  Gastrointestinal: Negative for abdominal pain, diarrhea, nausea and vomiting.  Genitourinary: Negative for difficulty urinating and dysuria.  Musculoskeletal: Negative for back pain and joint swelling.  Skin: Negative for color change and rash.  Neurological: Negative for dizziness, light-headedness and headaches.  Psychiatric/Behavioral: Negative for agitation and dysphoric mood.         Objective:    Physical Exam  Constitutional: She appears well-developed and well-nourished. No distress.  HENT:  Nose: Nose normal.  Mouth/Throat: Oropharynx is clear and moist.  Neck: Neck supple. No thyromegaly present.  Cardiovascular: Normal rate and regular rhythm.   Pulmonary/Chest: Breath sounds normal. No respiratory distress. She has no wheezes.  Abdominal: Soft. Bowel sounds are normal. There is no tenderness.  Musculoskeletal: She exhibits no edema or tenderness.  Lymphadenopathy:    She has no cervical adenopathy.  Skin: No rash noted. No erythema.  Psychiatric: She has a normal mood and affect. Her behavior is normal.    BP 128/76   Pulse 62   Temp 97.7 F (36.5 C)   Resp 16   Wt 134 lb (60.8 kg)   LMP 04/28/1992   BMI 25.32 kg/m  Wt Readings from Last 3 Encounters:  11/09/15 134 lb (60.8 kg)  11/07/15 134 lb 12.8 oz (61.1 kg)  08/20/15 136 lb 4 oz (61.8 kg)     Lab Results  Component Value Date   WBC 6.7 11/05/2015   HGB 14.4 11/05/2015   HCT 43.1 11/05/2015   PLT 248.0 11/05/2015   GLUCOSE 92 11/05/2015   CHOL 212 (H) 11/05/2015   TRIG 88.0 11/05/2015   HDL 62.50 11/05/2015   LDLCALC 132 (H) 11/05/2015   ALT 16 11/05/2015   AST 19 11/05/2015   NA 141 11/05/2015   K 4.9 11/05/2015   CL 106 11/05/2015   CREATININE 0.90 11/05/2015   BUN 13 11/05/2015   CO2 29 11/05/2015   TSH 1.21 06/08/2015   HGBA1C 5.6 11/05/2015    Dg Chest 2 View  Result Date: 08/20/2015 CLINICAL DATA:  Persistent cough for 1 month. EXAM: CHEST  2 VIEW COMPARISON:  11/01/2014 FINDINGS: Cardiomediastinal silhouette is normal. Mediastinal contours appear intact. There is no evidence of focal airspace consolidation, pleural effusion or pneumothorax. Osseous structures are without acute abnormality. Soft tissues are grossly normal. IMPRESSION: No active cardiopulmonary disease. Electronically Signed   By: Fidela Salisbury M.D.   On: 08/20/2015 12:50       Assessment &  Plan:   Problem List Items Addressed This Visit    Hollenhorst plaque    Had echo and carotid ultrasound - per report ok.  Start crestor as  Directed.  Follow.        Relevant Medications   rosuvastatin (CRESTOR) 5 MG tablet   Hypercholesterolemia - Primary    Discussed cholesterol levels.  She is agreeable to cholesterol medication.  Start crestor as directed.  Follow cholesterol levels and liver panel.        Relevant Medications   rosuvastatin (CRESTOR) 5 MG tablet   Other Relevant Orders  Hepatic function panel   Hyperglycemia    Low carb diet and exercise.  Follow met b and a1c.       Hypertension    Blood pressure under good control.  Continue same medication regimen.  Follow pressures.  Follow metabolic panel.        Relevant Medications   rosuvastatin (CRESTOR) 5 MG tablet   Stress    Discussed with her today.  She feels she is handling things well.  Follow.         Other Visit Diagnoses   None.      [name], MD

## 2015-11-10 ENCOUNTER — Encounter: Payer: Self-pay | Admitting: Internal Medicine

## 2015-11-10 NOTE — Assessment & Plan Note (Signed)
Discussed with her today.  She feels she is handling things well.  Follow.

## 2015-11-10 NOTE — Assessment & Plan Note (Signed)
Blood pressure under good control.  Continue same medication regimen.  Follow pressures.  Follow metabolic panel.   

## 2015-11-10 NOTE — Assessment & Plan Note (Signed)
Discussed cholesterol levels.  She is agreeable to cholesterol medication.  Start crestor as directed.  Follow cholesterol levels and liver panel.

## 2015-11-10 NOTE — Assessment & Plan Note (Signed)
Low carb diet and exercise.  Follow met b and a1c.  

## 2015-11-10 NOTE — Assessment & Plan Note (Signed)
Had echo and carotid ultrasound - per report ok.  Start crestor as  Directed.  Follow.

## 2015-12-03 ENCOUNTER — Other Ambulatory Visit: Payer: Self-pay

## 2015-12-21 ENCOUNTER — Other Ambulatory Visit: Payer: Federal, State, Local not specified - PPO

## 2015-12-25 ENCOUNTER — Other Ambulatory Visit: Payer: Self-pay | Admitting: Internal Medicine

## 2015-12-25 NOTE — Telephone Encounter (Signed)
Last filled 11/02/15 #30 Marcelyn Ditty

## 2015-12-26 NOTE — Telephone Encounter (Signed)
faxed

## 2015-12-26 NOTE — Telephone Encounter (Signed)
rx ok's for clonazepam #30 with one refill.

## 2016-03-14 ENCOUNTER — Ambulatory Visit (INDEPENDENT_AMBULATORY_CARE_PROVIDER_SITE_OTHER): Payer: Medicare Other | Admitting: Internal Medicine

## 2016-03-14 ENCOUNTER — Encounter: Payer: Self-pay | Admitting: Internal Medicine

## 2016-03-14 DIAGNOSIS — F439 Reaction to severe stress, unspecified: Secondary | ICD-10-CM

## 2016-03-14 DIAGNOSIS — R739 Hyperglycemia, unspecified: Secondary | ICD-10-CM | POA: Diagnosis not present

## 2016-03-14 DIAGNOSIS — E7800 Pure hypercholesterolemia, unspecified: Secondary | ICD-10-CM

## 2016-03-14 DIAGNOSIS — H34219 Partial retinal artery occlusion, unspecified eye: Secondary | ICD-10-CM

## 2016-03-14 DIAGNOSIS — I10 Essential (primary) hypertension: Secondary | ICD-10-CM

## 2016-03-14 NOTE — Progress Notes (Signed)
[name], female   DOB: 10-01-1940, 75 y.o.   MRN: [id number]   Subjective:    [name], female    DOB: Jun 24, 1940, 75 y.o.   MRN: [id number]  HPI  Patient here for a scheduled follow up.  She is doing well.  Staying active.  No chest pain.  No sob.  Feels better.  Eating.  Trying to watch her diet.  No acid reflux.  No abdominal pain or cramping.  Bowels stable.  Handling stress well.     Past Medical History:  Diagnosis Date  . Allergy   . Carpal tunnel syndrome 1999  . GERD (gastroesophageal reflux disease)   . Hypercholesterolemia    By last lipid panel  . Hypertension    Borderline. She does not take the 12.5 HCTZ daily   Past Surgical History:  Procedure Laterality Date  . APPENDECTOMY    . CARPAL TUNNEL RELEASE     right hand  . EYE SURGERY     left  . TONSILLECTOMY     Family History  Problem Relation Age of Onset  . Diabetes Mother   . Hypertension Brother   . Diabetes Maternal Grandmother   . Breast cancer Neg Hx   . Colon cancer Neg Hx    Social History   Social History  . Marital status: Married    Spouse name: N/A  . Number of children: 3  . Years of education: N/A   Occupational History  .  Other   Social History Main Topics  . Smoking status: Never Smoker  . Smokeless tobacco: Never Used  . Alcohol use 0.0 oz/week  . Drug use: No  . Sexual activity: No   Other Topics Concern  . None   Social History Narrative   Recently widowed. Her husband died of a heart attack in May 05, 2015    Outpatient Encounter Prescriptions as of 03/14/2016  Medication Sig  . ALOE VERA PO Take 1 tablet by mouth daily.   Marland Kitchen aspirin (ASPIR-81) 81 MG EC tablet Take 81 mg by mouth daily. Swallow whole.  . Cholecalciferol (VITAMIN D3) 1000 UNITS CAPS Take 1,000 Units by mouth 2 (two) times daily.   . clonazePAM (KLONOPIN) 0.5 MG tablet TAKE (1) TABLET DAILY AS NEEDED  . CYANOCOBALAMIN PO Take 1 tablet by mouth daily.   . hydrochlorothiazide  (HYDRODIURIL) 12.5 MG tablet Take 12.5 mg by mouth as needed (as needed for edema and hypertension).   Marland Kitchen MAGNESIUM CITRATE PO Take 1 tablet by mouth daily.   . Omega-3 Fatty Acids (FISH OIL) 1000 MG CAPS Take 1,000 mg by mouth 3 (three) times daily.   Marland Kitchen OVER THE COUNTER MEDICATION Take 1 tablet by mouth daily. Hair supplement  . rosuvastatin (CRESTOR) 5 MG tablet Take 1 tablet (5 mg total) by mouth daily.  . Triamcinolone Acetonide (NASACORT AQ NA) Place 1 spray into both nostrils daily.   . vitamin C (ASCORBIC ACID) 500 MG tablet Take 500 mg by mouth 2 (two) times daily.  . [DISCONTINUED] clonazePAM (KLONOPIN) 0.5 MG tablet TAKE (1) TABLET DAILY AS NEEDED   No facility-administered encounter medications on file as of 03/14/2016.     Review of Systems  Constitutional: Negative for appetite change and unexpected weight change.  HENT: Negative for congestion and sinus pressure.   Respiratory: Negative for cough, chest tightness and shortness of breath.   Cardiovascular: Negative for chest pain, palpitations and leg swelling.  Gastrointestinal: Negative for abdominal  pain, diarrhea, nausea and vomiting.  Genitourinary: Negative for difficulty urinating and dysuria.  Musculoskeletal: Negative for back pain and joint swelling.  Skin: Negative for color change and rash.  Neurological: Negative for dizziness, light-headedness and headaches.  Psychiatric/Behavioral: Negative for agitation and dysphoric mood.       Objective:    Physical Exam  Constitutional: She appears well-developed and well-nourished. No distress.  HENT:  Nose: Nose normal.  Mouth/Throat: Oropharynx is clear and moist.  Neck: Neck supple. No thyromegaly present.  Cardiovascular: Normal rate and regular rhythm.   Pulmonary/Chest: Breath sounds normal. No respiratory distress. She has no wheezes.  Abdominal: Soft. Bowel sounds are normal. There is no tenderness.  Musculoskeletal: She exhibits no edema or tenderness.    Lymphadenopathy:    She has no cervical adenopathy.  Skin: No rash noted. No erythema.  Psychiatric: She has a normal mood and affect. Her behavior is normal.    BP 128/70   Pulse 81   Temp 98.1 F (36.7 C) (Oral)   Ht 5' 1"  (1.549 m)   Wt 138 lb (62.6 kg)   LMP 04/28/1992   SpO2 98%   BMI 26.07 kg/m  Wt Readings from Last 3 Encounters:  03/14/16 138 lb (62.6 kg)  11/09/15 134 lb (60.8 kg)  11/07/15 134 lb 12.8 oz (61.1 kg)     Lab Results  Component Value Date   WBC 6.7 11/05/2015   HGB 14.4 11/05/2015   HCT 43.1 11/05/2015   PLT 248.0 11/05/2015   GLUCOSE 92 11/05/2015   CHOL 212 (H) 11/05/2015   TRIG 88.0 11/05/2015   HDL 62.50 11/05/2015   LDLCALC 132 (H) 11/05/2015   ALT 16 11/05/2015   AST 19 11/05/2015   NA 141 11/05/2015   K 4.9 11/05/2015   CL 106 11/05/2015   CREATININE 0.90 11/05/2015   BUN 13 11/05/2015   CO2 29 11/05/2015   TSH 1.21 06/08/2015   HGBA1C 5.6 11/05/2015    Dg Chest 2 View  Result Date: 08/20/2015 CLINICAL DATA:  Persistent cough for 1 month. EXAM: CHEST  2 VIEW COMPARISON:  11/01/2014 FINDINGS: Cardiomediastinal silhouette is normal. Mediastinal contours appear intact. There is no evidence of focal airspace consolidation, pleural effusion or pneumothorax. Osseous structures are without acute abnormality. Soft tissues are grossly normal. IMPRESSION: No active cardiopulmonary disease. Electronically Signed   By: Fidela Salisbury M.D.   On: 08/20/2015 12:50       Assessment & Plan:   Problem List Items Addressed This Visit    Hollenhorst plaque    Had echo and carotid ultrasound.  Per report - ok.  On crestor.  Follow.        Hypercholesterolemia    Low cholesterol diet and exercise.  Follow lipid panel and liver function tests.  On crestor.        Relevant Orders   Hepatic function panel   Lipid panel   Hyperglycemia    Low carb diet and exercise.  Follow met b and a1c.        Relevant Orders   Hemoglobin A1c    Hypertension    Blood pressure under good control.  Continue same medication regimen.  Follow pressures.  Follow metabolic panel.        Relevant Orders   Basic metabolic panel   Stress    She is doing better.  Feels better.  Follow.           [name], MD

## 2016-03-14 NOTE — Progress Notes (Signed)
Pre visit review using our clinic review tool, if applicable. No additional management support is needed unless otherwise documented below in the visit note. 

## 2016-03-16 ENCOUNTER — Encounter: Payer: Self-pay | Admitting: Internal Medicine

## 2016-03-16 MED ORDER — CLONAZEPAM 0.5 MG PO TABS: ORAL_TABLET | ORAL | 1 refills | Status: DC

## 2016-03-16 NOTE — Assessment & Plan Note (Signed)
Low carb diet and exercise.  Follow met b and a1c.   

## 2016-03-16 NOTE — Assessment & Plan Note (Signed)
Blood pressure under good control.  Continue same medication regimen.  Follow pressures.  Follow metabolic panel.   

## 2016-03-16 NOTE — Assessment & Plan Note (Signed)
She is doing better.  Feels better.  Follow.

## 2016-03-16 NOTE — Assessment & Plan Note (Signed)
Low cholesterol diet and exercise.  Follow lipid panel and liver function tests.  On crestor.   

## 2016-03-16 NOTE — Assessment & Plan Note (Signed)
Had echo and carotid ultrasound.  Per report - ok.  On crestor.  Follow.

## 2016-03-29 DIAGNOSIS — M79632 Pain in left forearm: Secondary | ICD-10-CM | POA: Diagnosis not present

## 2016-03-29 DIAGNOSIS — S59912A Unspecified injury of left forearm, initial encounter: Secondary | ICD-10-CM | POA: Diagnosis not present

## 2016-04-28 ENCOUNTER — Other Ambulatory Visit: Payer: Self-pay | Admitting: Internal Medicine

## 2016-04-28 DIAGNOSIS — Z1231 Encounter for screening mammogram for malignant neoplasm of breast: Secondary | ICD-10-CM

## 2016-05-23 ENCOUNTER — Ambulatory Visit: Payer: Federal, State, Local not specified - PPO

## 2016-06-06 ENCOUNTER — Ambulatory Visit
Admission: RE | Admit: 2016-06-06 | Discharge: 2016-06-06 | Disposition: A | Discharge status: Home / Self Care | Payer: Medicare Other | Source: Ambulatory Visit | Attending: Internal Medicine | Admitting: Internal Medicine

## 2016-06-06 DIAGNOSIS — Z1231 Encounter for screening mammogram for malignant neoplasm of breast: Secondary | ICD-10-CM | POA: Diagnosis not present

## 2016-06-25 ENCOUNTER — Other Ambulatory Visit: Payer: Self-pay | Admitting: Internal Medicine

## 2016-06-26 NOTE — Telephone Encounter (Signed)
Medication: Klonopin 0.5mg   Directions:1 po qd  Last given: 03/16/16 #30 Number refills: 1 Last o/v: 11/09/15 Follow up: 0

## 2016-06-27 NOTE — Telephone Encounter (Signed)
Have called into pharmacy and gave information verbal

## 2016-09-16 ENCOUNTER — Other Ambulatory Visit: Payer: Self-pay | Admitting: Internal Medicine

## 2016-09-16 NOTE — Telephone Encounter (Signed)
Last OV was 03/14/16, has AWV coming up.  Last refill was 06/25/16 #30, no refills.  Please advise, thanks

## 2016-09-16 NOTE — Telephone Encounter (Signed)
Script faxed.

## 2016-09-16 NOTE — Telephone Encounter (Signed)
Can you help with a follow up appt for this patient? Thanks

## 2016-09-16 NOTE — Telephone Encounter (Signed)
Refilled x 1, needs a f/u appt scheduled.

## 2016-09-17 NOTE — Telephone Encounter (Signed)
Lm for pt to call back and schedule appt  °

## 2016-11-06 ENCOUNTER — Ambulatory Visit: Payer: Federal, State, Local not specified - PPO

## 2016-11-07 ENCOUNTER — Telehealth: Payer: Self-pay | Admitting: Internal Medicine

## 2016-11-07 ENCOUNTER — Other Ambulatory Visit: Payer: Self-pay | Admitting: Internal Medicine

## 2016-11-07 ENCOUNTER — Ambulatory Visit (INDEPENDENT_AMBULATORY_CARE_PROVIDER_SITE_OTHER): Payer: Medicare Other

## 2016-11-07 VITALS — BP 136/80 | HR 74 | Temp 98.5°F | Resp 14 | Ht 61.1 in | Wt 139.4 lb

## 2016-11-07 DIAGNOSIS — Z1211 Encounter for screening for malignant neoplasm of colon: Secondary | ICD-10-CM

## 2016-11-07 DIAGNOSIS — Z0000 Encounter for general adult medical examination without abnormal findings: Secondary | ICD-10-CM

## 2016-11-07 DIAGNOSIS — Z1389 Encounter for screening for other disorder: Secondary | ICD-10-CM | POA: Diagnosis not present

## 2016-11-07 DIAGNOSIS — Z1331 Encounter for screening for depression: Secondary | ICD-10-CM

## 2016-11-07 NOTE — Progress Notes (Signed)
Subjective:   Donna Lara is a 76 y.o. female who presents for Medicare Annual (Subsequent) preventive examination.  Review of Systems:  No ROS.  Medicare Wellness Visit. Additional risk factors are reflected in the social history.  Cardiac Risk Factors include: advanced age (>61men, >33 women);hypertension     Objective:     Vitals: BP 136/80 (BP Location: Left Arm, Patient Position: Sitting, Cuff Size: Normal)   Pulse 74   Temp 98.5 F (36.9 C) (Oral)   Resp 14   Ht 5' 1.1" (1.552 m)   Wt 139 lb 6.4 oz (63.2 kg)   LMP 04/28/1992   SpO2 97%   BMI 26.25 kg/m   Body mass index is 26.25 kg/m.   Tobacco History  Smoking Status  . Never Smoker  Smokeless Tobacco  . Never Used     Counseling given: Not Answered   Past Medical History:  Diagnosis Date  . Allergy   . Carpal tunnel syndrome 1999  . GERD (gastroesophageal reflux disease)   . Hypercholesterolemia    By last lipid panel  . Hypertension    Borderline. She does not take the 12.5 HCTZ daily   Past Surgical History:  Procedure Laterality Date  . APPENDECTOMY    . CARPAL TUNNEL RELEASE     right hand  . EYE SURGERY     left  . TONSILLECTOMY     Family History  Problem Relation Age of Onset  . Diabetes Mother   . Hypertension Brother   . Diabetes Maternal Grandmother   . Asthma Daughter   . Breast cancer Neg Hx   . Colon cancer Neg Hx    History  Sexual Activity  . Sexual activity: No    Outpatient Encounter Prescriptions as of 11/07/2016  Medication Sig  . ALOE VERA PO Take 1 tablet by mouth daily.   Marland Kitchen aspirin (ASPIR-81) 81 MG EC tablet Take 81 mg by mouth daily. Swallow whole.  . Cholecalciferol (VITAMIN D3) 1000 UNITS CAPS Take 1,000 Units by mouth 2 (two) times daily.   . CYANOCOBALAMIN PO Take 1 tablet by mouth daily.   . hydrochlorothiazide (HYDRODIURIL) 12.5 MG tablet Take 12.5 mg by mouth as needed (as needed for edema and hypertension).   Marland Kitchen MAGNESIUM CITRATE PO Take 1 tablet by  mouth daily.   . Omega-3 Fatty Acids (FISH OIL) 1000 MG CAPS Take 1,000 mg by mouth 2 (two) times daily.   Marland Kitchen OVER THE COUNTER MEDICATION Take 1 tablet by mouth daily. Hair supplement  . Red Yeast Rice Extract (RED YEAST RICE PO) Take 2 capsules by mouth daily.  . vitamin C (ASCORBIC ACID) 500 MG tablet Take 500 mg by mouth 2 (two) times daily.  . [DISCONTINUED] clonazePAM (KLONOPIN) 0.5 MG tablet TAKE 1 TABLET DAILY.  . [DISCONTINUED] Triamcinolone Acetonide (NASACORT AQ NA) Place 1 spray into both nostrils daily.   . rosuvastatin (CRESTOR) 5 MG tablet Take 1 tablet (5 mg total) by mouth daily. (Patient not taking: Reported on 11/07/2016)   No facility-administered encounter medications on file as of 11/07/2016.     Activities of Daily Living In your present state of health, do you have any difficulty performing the following activities: 11/07/2016  Hearing? N  Vision? N  Difficulty concentrating or making decisions? Y  Walking or climbing stairs? N  Dressing or bathing? N  Doing errands, shopping? N  Preparing Food and eating ? N  Using the Toilet? N  In the past six months, have  you accidently leaked urine? N  Do you have problems with loss of bowel control? N  Managing your Medications? N  Managing your Finances? N  Housekeeping or managing your Housekeeping? N  Some recent data might be hidden    Patient Care Team: [name], MD as PCP - General (Internal Medicine)    Assessment:    This is a routine wellness examination for Donna Lara. The goal of the wellness visit is to assist the patient how to close the gaps in care and create a preventative care plan for the patient.   The roster of all physicians providing medical care to patient is listed in the Snapshot section of the chart.  Taking calcium VIT D as appropriate/Osteoporosis risk reviewed.    Safety issues reviewed; Smoke and carbon monoxide detectors in the home. Firearms locked up in the home. Wears seatbelts when  driving or riding with others. Patient does wear sunscreen or protective clothing when in direct sunlight. No violence in the home.  Depression- PHQ 2 &9 complete.  No signs/symptoms or verbal communication regarding little pleasure in doing things, feeling down, depressed or hopeless. No changes in sleeping, energy, eating, concentrating.  No thoughts of self harm or harm towards others.  Time spent on this topic is 8 minutes.   Patient is alert, normal appearance, oriented to person/place/and time.  Correctly identified the president of the [place], recall of 3/3 words, and performing simple calculations. Displays appropriate judgement and can read correct time from watch face.   No new identified risk were noted.  No failures at ADL's or IADL's.    BMI- discussed the importance of a healthy diet, water intake and the benefits of aerobic exercise. Educational material provided.   24 hour diet recall: Breakfast: 2 boiled eggs  Lunch: fried chicken sandwich Dinner: chicken and cheese fajitas Snack: fruit Daily fluid intake: 0 cups of caffeine, 5 cups of water Encouraged low carb foods  Dental- every 6 months.  Dr. Juleen China.  Eye- Visual acuity not assessed per patient preference since they have regular follow up with the ophthalmologist.  Wears corrective lenses when reading.  Sleep patterns- Sleeps 7 hours at night.  Wakes feeling rested.  Naps as needed during the day.  Taking medication as directed.  Colonoscopy ordered; follow as directed.  Educational material provided.  TDAP vaccine deferred per patient preference.  Follow up with insurance.  Educational material provided.  Pneumococcal 23 vaccine discussed.  Deferred at this time per patient request.  Educational material provided.  Patient Concerns: None at this time. Follow up with PCP as needed.  Exercise Activities and Dietary recommendations Current Exercise Habits: Home exercise routine, Type of exercise:  calisthenics;walking, Time (Minutes): 30, Frequency (Times/Week): 3, Weekly Exercise (Minutes/Week): 90, Intensity: Moderate  Goals    . Healthy Lifestyle          STAY HYDRATED AND DRINK PLENTY OF WATER.   LOW CARB FOODS. STAY ACTIVE AND CONTINUE EXERCISING.    . Increase physical activity          Tone body by lifting weights, walking, and using the exercise machine (Nordic Track) at home for a minimum of 3x a week for 30 minute sessions.      Fall Risk Fall Risk  11/07/2016 03/14/2016 12/03/2015 11/07/2015 11/02/2014  Falls in the past year? Yes No No No No  Comment - - Emmi Telephone Survey: data to providers prior to load - -  Number falls in past yr: 1 - - - -  Injury with Fall? No - - - -  Follow up Falls prevention discussed;Education provided - - - -  Comment Missed a step when walking - - - -   Depression Screen PHQ 2/9 Scores 11/07/2016 03/14/2016 11/07/2015 11/02/2014  PHQ - 2 Score 0 0 0 0  PHQ- 9 Score 0 - - -     Cognitive Function MMSE - Mini Mental State Exam 11/07/2016 11/07/2015 11/02/2014  Orientation to time 5 5 5   Orientation to Place 5 5 5   Registration 3 3 3   Attention/ Calculation 5 5 5   Recall 3 3 3   Language- name 2 objects 2 2 2   Language- repeat 1 1 1   Language- follow 3 step command 3 3 3   Language- read & follow direction 1 1 1   Write a sentence 1 1 1   Copy design 1 1 1   Total score 30 30 30         Immunization History  Administered Date(s) Administered  . Pneumococcal Conjugate-13 11/07/2015   Screening Tests Health Maintenance  Topic Date Due  . TETANUS/TDAP  02/10/1962  . COLONOSCOPY  02/18/2016  . PNA vac Low Risk Adult (2 of 2 - PPSV23) 11/06/2016  . INFLUENZA VACCINE  03/14/2017 (Originally 11/05/2016)  . MAMMOGRAM  06/07/2018  . DEXA SCAN  Completed      Plan:   End of life planning; Advanced aging; Advanced directives discussed.  No HCPOA/Living Will.  Additional information provided to help them start the conversation with family.   Copy of HCPOA/Living Will requested upon completion. Time spent on this topic is 20 minutes.  I have personally reviewed and noted the following in the patient's chart:   . Medical and social history . Use of alcohol, tobacco or illicit drugs  . Current medications and supplements . Functional ability and status . Nutritional status . Physical activity . Advanced directives . List of other physicians . Hospitalizations, surgeries, and ER visits in previous 12 months . Vitals . Screenings to include cognitive, depression, and falls . Referrals and appointments  In addition, I have reviewed and discussed with patient certain preventive protocols, quality metrics, and best practice recommendations. A written personalized care plan for preventive services as well as general preventive health recommendations were provided to patient.     OBrien-Blaney, Justice Aguirre L, LPN  12/10/720   Reviewed above information.  Agree with assessment and plan.  Dr Nicki Reaper

## 2016-11-07 NOTE — Patient Instructions (Addendum)
Ms. Emigh , Thank you for taking time to come for your Medicare Wellness Visit. I appreciate your ongoing commitment to your health goals. Please review the following plan we discussed and let me know if I can assist you in the future.   Follow up with Dr. Nicki Reaper as needed.    Bring a copy of your [place] and/or Living Will to be scanned into chart once completed.  TDAP vaccine, check with your insurance.  Colonoscopy ordered, follow as directed.  Consider pneumococcal 23 at your next visit.    Have a great day!  These are the goals we discussed: Goals    . Healthy Lifestyle          STAY HYDRATED AND DRINK PLENTY OF WATER.   LOW CARB FOODS. STAY ACTIVE AND CONTINUE EXERCISING.    . Increase physical activity          Tone body by lifting weights, walking, and using the exercise machine (Nordic Track) at home for a minimum of 3x a week for 30 minute sessions.       This is a list of the screening recommended for you and due dates:  Health Maintenance  Topic Date Due  . Tetanus Vaccine  02/10/1962  . Colon Cancer Screening  02/18/2016  . Pneumonia vaccines (2 of 2 - PPSV23) 11/06/2016  . Flu Shot  03/14/2017*  . Mammogram  06/07/2018  . DEXA scan (bone density measurement)  Completed  *Topic was postponed. The date shown is not the original due date.    Colonoscopy, Adult A colonoscopy is an exam to look at the entire large intestine. During the exam, a lubricated, bendable tube is inserted into the anus and then passed into the rectum, colon, and other parts of the large intestine. A colonoscopy is often done as a part of normal colorectal screening or in response to certain symptoms, such as anemia, persistent diarrhea, abdominal pain, and blood in the stool. The exam can help screen for and diagnose medical problems, including:  Tumors.  Polyps.  Inflammation.  Areas of bleeding.  Tell a health care provider about:  Any allergies you  have.  All medicines you are taking, including vitamins, herbs, eye drops, creams, and over-the-counter medicines.  Any problems you or family members have had with anesthetic medicines.  Any blood disorders you have.  Any surgeries you have had.  Any medical conditions you have.  Any problems you have had passing stool. What are the risks? Generally, this is a safe procedure. However, problems may occur, including:  Bleeding.  A tear in the intestine.  A reaction to medicines given during the exam.  Infection (rare).  What happens before the procedure? Eating and drinking restrictions Follow instructions from your health care provider about eating and drinking, which may include:  A few days before the procedure - follow a low-fiber diet. Avoid nuts, seeds, dried fruit, raw fruits, and vegetables.  1-3 days before the procedure - follow a clear liquid diet. Drink only clear liquids, such as clear broth or bouillon, black coffee or tea, clear juice, clear soft drinks or sports drinks, gelatin dessert, and popsicles. Avoid any liquids that contain red or purple dye.  On the day of the procedure - do not eat or drink anything during the 2 hours before the procedure, or within the time period that your health care provider recommends.  Bowel prep If you were prescribed an oral bowel prep to clean out  your colon:  Take it as told by your health care provider. Starting the day before your procedure, you will need to drink a large amount of medicated liquid. The liquid will cause you to have multiple loose stools until your stool is almost clear or light green.  If your skin or anus gets irritated from diarrhea, you may use these to relieve the irritation: ? Medicated wipes, such as adult wet wipes with aloe and vitamin E. ? A skin soothing-product like petroleum jelly.  If you vomit while drinking the bowel prep, take a break for up to 60 minutes and then begin the bowel prep  again. If vomiting continues and you cannot take the bowel prep without vomiting, call your health care provider.  General instructions  Ask your health care provider about changing or stopping your regular medicines. This is especially important if you are taking diabetes medicines or blood thinners.  Plan to have someone take you home from the hospital or clinic. What happens during the procedure?  An IV tube may be inserted into one of your veins.  You will be given medicine to help you relax (sedative).  To reduce your risk of infection: ? Your health care team will wash or sanitize their hands. ? Your anal area will be washed with soap.  You will be asked to lie on your side with your knees bent.  Your health care provider will lubricate a long, thin, flexible tube. The tube will have a camera and a light on the end.  The tube will be inserted into your anus.  The tube will be gently eased through your rectum and colon.  Air will be delivered into your colon to keep it open. You may feel some pressure or cramping.  The camera will be used to take images during the procedure.  A small tissue sample may be removed from your body to be examined under a microscope (biopsy). If any potential problems are found, the tissue will be sent to a lab for testing.  If small polyps are found, your health care provider may remove them and have them checked for cancer cells.  The tube that was inserted into your anus will be slowly removed. The procedure may vary among health care providers and hospitals. What happens after the procedure?  Your blood pressure, heart rate, breathing rate, and blood oxygen level will be monitored until the medicines you were given have worn off.  Do not drive for 24 hours after the exam.  You may have a small amount of blood in your stool.  You may pass gas and have mild abdominal cramping or bloating due to the air that was used to inflate your colon  during the exam.  It is up to you to get the results of your procedure. Ask your health care provider, or the department performing the procedure, when your results will be ready. This information is not intended to replace advice given to you by your health care provider. Make sure you discuss any questions you have with your health care provider. Document Released: 03/21/2000 Document Revised: 01/23/2016 Document Reviewed: 06/05/2015 Elsevier Interactive Patient Education  2018 [employer].

## 2016-11-07 NOTE — Telephone Encounter (Signed)
Last seen 03/14/16. Ok to refill?

## 2016-11-07 NOTE — Telephone Encounter (Signed)
Attempted to reach patient, left a VM to call back and schedule labs. Thanks

## 2016-11-07 NOTE — Telephone Encounter (Signed)
Pt would like to have labs done before her 10/11 appt. Please advise, thank you!  Call pt @ [phone]

## 2016-11-07 NOTE — Telephone Encounter (Signed)
Labs are in chart need to call patent and make fasting lab app

## 2016-11-11 ENCOUNTER — Other Ambulatory Visit: Payer: Self-pay | Admitting: Internal Medicine

## 2016-11-12 NOTE — Telephone Encounter (Signed)
Lab app made.

## 2016-11-20 ENCOUNTER — Telehealth: Payer: Self-pay

## 2016-11-20 ENCOUNTER — Other Ambulatory Visit: Payer: Self-pay

## 2016-11-20 DIAGNOSIS — Z1211 Encounter for screening for malignant neoplasm of colon: Secondary | ICD-10-CM

## 2016-11-20 NOTE — Telephone Encounter (Signed)
Gastroenterology Pre-Procedure Review  Request Date: 12/01/16 Requesting Physician: Dr. Allen Norris  PATIENT REVIEW QUESTIONS: The patient responded to the following health history questions as indicated:   0. Patient is not being treated for any illnesses at this time. 1. Are you having any GI issues? no 2. Do you have a personal history of Polyps? no 3. Do you have a family history of Colon Cancer or Polyps? no 4. Diabetes Mellitus? no 5. Joint replacements in the past 12 months?no 6. Major health problems in the past 3 months?no 7. Any artificial heart valves, MVP, or defibrillator?no    MEDICATIONS & ALLERGIES:    Patient reports the following regarding taking any anticoagulation/antiplatelet therapy:   Plavix, Coumadin, Eliquis, Xarelto, Lovenox, Pradaxa, Brilinta, or Effient? no Aspirin? yes (81 mg daily)  Patient confirms/reports the following medications:  Current Outpatient Prescriptions  Medication Sig Dispense Refill  . ALOE VERA PO Take 1 tablet by mouth daily.     Marland Kitchen aspirin (ASPIR-81) 81 MG EC tablet Take 81 mg by mouth daily. Swallow whole.    . Cholecalciferol (VITAMIN D3) 1000 UNITS CAPS Take 1,000 Units by mouth 2 (two) times daily.     . clonazePAM (KLONOPIN) 0.5 MG tablet TAKE 1 TABLET DAILY. 30 tablet 0  . CYANOCOBALAMIN PO Take 1 tablet by mouth daily.     . hydrochlorothiazide (HYDRODIURIL) 12.5 MG tablet Take 12.5 mg by mouth as needed (as needed for edema and hypertension).     Marland Kitchen MAGNESIUM CITRATE PO Take 1 tablet by mouth daily.     . Omega-3 Fatty Acids (FISH OIL) 1000 MG CAPS Take 1,000 mg by mouth 2 (two) times daily.     Marland Kitchen OVER THE COUNTER MEDICATION Take 1 tablet by mouth daily. Hair supplement    . Red Yeast Rice Extract (RED YEAST RICE PO) Take 2 capsules by mouth daily.    . rosuvastatin (CRESTOR) 5 MG tablet Take 1 tablet (5 mg total) by mouth daily. (Patient not taking: Reported on 11/07/2016) 30 tablet 1  . vitamin C (ASCORBIC ACID) 500 MG tablet Take 500  mg by mouth 2 (two) times daily.     No current facility-administered medications for this visit.     Patient confirms/reports the following allergies:  Allergies  Allergen Reactions  . Codeine Sulfate     Insomnia. Out of control.    No orders of the defined types were placed in this encounter.   AUTHORIZATION INFORMATION Primary Insurance: 1D#: Group #:  Secondary Insurance: 1D#: Group #:  SCHEDULE INFORMATION: Date: 12/01/16 Time: Location:[facility]

## 2016-11-25 ENCOUNTER — Encounter: Payer: Self-pay | Admitting: *Deleted

## 2016-11-28 NOTE — Discharge Instructions (Signed)
General Anesthesia, Adult, Care After °These instructions provide you with information about caring for yourself after your procedure. Your health care provider may also give you more specific instructions. Your treatment has been planned according to current medical practices, but problems sometimes occur. Call your health care provider if you have any problems or questions after your procedure. °What can I expect after the procedure? °After the procedure, it is common to have: °· Vomiting. °· A sore throat. °· Mental slowness. ° °It is common to feel: °· Nauseous. °· Cold or shivery. °· Sleepy. °· Tired. °· Sore or achy, even in parts of your body where you did not have surgery. ° °Follow these instructions at home: °For at least 24 hours after the procedure: °· Do not: °? Participate in activities where you could fall or become injured. °? Drive. °? Use heavy machinery. °? Drink alcohol. °? Take sleeping pills or medicines that cause drowsiness. °? Make important decisions or sign legal documents. °? Take care of children on your own. °· Rest. °Eating and drinking °· If you vomit, drink water, juice, or soup when you can drink without vomiting. °· Drink enough fluid to keep your urine clear or pale yellow. °· Make sure you have little or no nausea before eating solid foods. °· Follow the diet recommended by your health care provider. °General instructions °· Have a responsible adult stay with you until you are awake and alert. °· Return to your normal activities as told by your health care provider. Ask your health care provider what activities are safe for you. °· Take over-the-counter and prescription medicines only as told by your health care provider. °· If you smoke, do not smoke without supervision. °· Keep all follow-up visits as told by your health care provider. This is important. °Contact a health care provider if: °· You continue to have nausea or vomiting at home, and medicines are not helpful. °· You  cannot drink fluids or start eating again. °· You cannot urinate after 8-12 hours. °· You develop a skin rash. °· You have fever. °· You have increasing redness at the site of your procedure. °Get help right away if: °· You have difficulty breathing. °· You have chest pain. °· You have unexpected bleeding. °· You feel that you are having a life-threatening or urgent problem. °This information is not intended to replace advice given to you by your health care provider. Make sure you discuss any questions you have with your health care provider. °Document Released: 06/30/2000 Document Revised: 08/27/2015 Document Reviewed: 03/08/2015 °Elsevier Interactive Patient Education © 2018 [employer] ° °

## 2016-12-01 ENCOUNTER — Ambulatory Visit: Payer: Medicare Other | Admitting: Anesthesiology

## 2016-12-01 ENCOUNTER — Ambulatory Visit
Admission: RE | Admit: 2016-12-01 | Discharge: 2016-12-01 | Disposition: A | Discharge status: Home / Self Care | Payer: Medicare Other | Source: Ambulatory Visit | Attending: Gastroenterology | Admitting: Gastroenterology

## 2016-12-01 ENCOUNTER — Encounter
Admission: RE | Disposition: A | Discharge status: Home / Self Care | Payer: Self-pay | Source: Ambulatory Visit | Attending: Gastroenterology

## 2016-12-01 DIAGNOSIS — Z79899 Other long term (current) drug therapy: Secondary | ICD-10-CM | POA: Diagnosis not present

## 2016-12-01 DIAGNOSIS — Z1211 Encounter for screening for malignant neoplasm of colon: Secondary | ICD-10-CM

## 2016-12-01 DIAGNOSIS — Z7982 Long term (current) use of aspirin: Secondary | ICD-10-CM | POA: Insufficient documentation

## 2016-12-01 DIAGNOSIS — D123 Benign neoplasm of transverse colon: Secondary | ICD-10-CM

## 2016-12-01 DIAGNOSIS — D122 Benign neoplasm of ascending colon: Secondary | ICD-10-CM | POA: Diagnosis not present

## 2016-12-01 DIAGNOSIS — I10 Essential (primary) hypertension: Secondary | ICD-10-CM | POA: Insufficient documentation

## 2016-12-01 HISTORY — PX: COLONOSCOPY WITH PROPOFOL: SHX5780

## 2016-12-01 HISTORY — PX: POLYPECTOMY: SHX149

## 2016-12-01 SURGERY — COLONOSCOPY WITH PROPOFOL
Anesthesia: General

## 2016-12-01 MED ORDER — OXYCODONE HCL 5 MG/5ML PO SOLN: 5.0000 mg | Freq: Once | ORAL | Status: DC | PRN

## 2016-12-01 MED ORDER — PROMETHAZINE HCL 25 MG/ML IJ SOLN: 6.2500 mg | INTRAMUSCULAR | Status: DC | PRN

## 2016-12-01 MED ORDER — OXYCODONE HCL 5 MG PO TABS: 5.0000 mg | ORAL_TABLET | Freq: Once | ORAL | Status: DC | PRN

## 2016-12-01 MED ORDER — LACTATED RINGERS IV SOLN
10.0000 mL/h | INTRAVENOUS | Status: DC
  Administered 2016-12-01: 10 mL/h via INTRAVENOUS

## 2016-12-01 MED ORDER — LIDOCAINE HCL (CARDIAC) 20 MG/ML IV SOLN
INTRAVENOUS | Status: DC | PRN
  Administered 2016-12-01: 50 mg via INTRAVENOUS

## 2016-12-01 MED ORDER — MEPERIDINE HCL 25 MG/ML IJ SOLN: 6.2500 mg | INTRAMUSCULAR | Status: DC | PRN

## 2016-12-01 MED ORDER — FENTANYL CITRATE (PF) 100 MCG/2ML IJ SOLN: 25.0000 ug | INTRAMUSCULAR | Status: DC | PRN

## 2016-12-01 MED ORDER — SODIUM CHLORIDE 0.9 % IV SOLN: INTRAVENOUS | Status: DC

## 2016-12-01 MED ORDER — PROPOFOL 10 MG/ML IV BOLUS
INTRAVENOUS | Status: DC | PRN
  Administered 2016-12-01: 50 mg via INTRAVENOUS
  Administered 2016-12-01: 100 mg via INTRAVENOUS

## 2016-12-01 SURGICAL SUPPLY — 23 items
CANISTER SUCT 1200ML W/VALVE (MISCELLANEOUS) ×4 IMPLANT
CLIP HMST 235XBRD CATH ROT (MISCELLANEOUS) IMPLANT
CLIP RESOLUTION 360 11X235 (MISCELLANEOUS)
FCP ESCP3.2XJMB 240X2.8X (MISCELLANEOUS)
FORCEPS BIOP RAD 4 LRG CAP 4 (CUTTING FORCEPS) ×4 IMPLANT
FORCEPS BIOP RJ4 240 W/NDL (MISCELLANEOUS)
FORCEPS ESCP3.2XJMB 240X2.8X (MISCELLANEOUS) IMPLANT
GOWN CVR UNV OPN BCK APRN NK (MISCELLANEOUS) ×4 IMPLANT
GOWN ISOL THUMB LOOP REG UNIV (MISCELLANEOUS) ×4
INJECTOR VARIJECT VIN23 (MISCELLANEOUS) IMPLANT
KIT DEFENDO VALVE AND CONN (KITS) IMPLANT
KIT ENDO PROCEDURE OLY (KITS) ×4 IMPLANT
MARKER SPOT ENDO TATTOO 5ML (MISCELLANEOUS) IMPLANT
PAD GROUND ADULT SPLIT (MISCELLANEOUS) IMPLANT
PROBE APC STR FIRE (PROBE) IMPLANT
RETRIEVER NET ROTH 2.5X230 LF (MISCELLANEOUS) IMPLANT
SNARE SHORT THROW 13M SML OVAL (MISCELLANEOUS) ×4 IMPLANT
SNARE SHORT THROW 30M LRG OVAL (MISCELLANEOUS) IMPLANT
SNARE SNG USE RND 15MM (INSTRUMENTS) IMPLANT
SPOT EX ENDOSCOPIC TATTOO (MISCELLANEOUS)
TRAP ETRAP POLY (MISCELLANEOUS) ×4 IMPLANT
VARIJECT INJECTOR VIN23 (MISCELLANEOUS)
WATER STERILE IRR 250ML POUR (IV SOLUTION) ×4 IMPLANT

## 2016-12-01 NOTE — Anesthesia Procedure Notes (Signed)
Performed by: Demiah Gullickson Pre-anesthesia Checklist: Patient identified, Emergency Drugs available, Suction available, Timeout performed and Patient being monitored Patient Re-evaluated:Patient Re-evaluated prior to induction Oxygen Delivery Method: Nasal cannula Placement Confirmation: positive ETCO2       

## 2016-12-01 NOTE — Anesthesia Postprocedure Evaluation (Signed)
Anesthesia Post Note  Patient: Donna Lara  Procedure(s) Performed: Procedure(s) (LRB): COLONOSCOPY WITH PROPOFOL (N/A) POLYPECTOMY INTESTINAL  Patient location during evaluation: PACU Anesthesia Type: General Level of consciousness: awake and alert Pain management: pain level controlled Vital Signs Assessment: post-procedure vital signs reviewed and stable Respiratory status: spontaneous breathing, nonlabored ventilation, respiratory function stable and patient connected to nasal cannula oxygen Cardiovascular status: blood pressure returned to baseline and stable Postop Assessment: no signs of nausea or vomiting Anesthetic complications: no    SCOURAS, NICOLE ELAINE

## 2016-12-01 NOTE — Op Note (Signed)
[facility] Gastroenterology Patient Name: Donna Lara Procedure Date: 12/01/2016 10:28 AM MRN: [id number] Account #: [id number] Date of Birth: 11-Sep-1940 Admit Type: Outpatient Age: 76 Room: [facility] [place] OR ROOM 01 Gender: Female Note Status: Finalized Procedure:            Colonoscopy Indications:          Screening for colorectal malignant neoplasm Providers:            Lucilla Lame MD, MD Referring MD:         [name], MD (Referring MD) Medicines:            Propofol per Anesthesia Complications:        No immediate complications. Procedure:            Pre-Anesthesia Assessment:                       - Prior to the procedure, a History and Physical was                        performed, and patient medications and allergies were                        reviewed. The patient's tolerance of previous                        anesthesia was also reviewed. The risks and benefits of                        the procedure and the sedation options and risks were                        discussed with the patient. All questions were                        answered, and informed consent was obtained. Prior                        Anticoagulants: The patient has taken no previous                        anticoagulant or antiplatelet agents. ASA Grade                        Assessment: II - A patient with mild systemic disease.                        After reviewing the risks and benefits, the patient was                        deemed in satisfactory condition to undergo the                        procedure.                       After obtaining informed consent, the colonoscope was                        passed under direct vision. Throughout the procedure,  the patient's blood pressure, pulse, and oxygen                        saturations were monitored continuously. The [place] [id number]) was introduced through the                   anus and advanced to the the cecum, identified by                        appendiceal orifice and ileocecal valve. The                        colonoscopy was performed without difficulty. The                        patient tolerated the procedure well. The quality of                        the bowel preparation was excellent. Findings:      The perianal and digital rectal examinations were normal.      Three sessile polyps were found in the ascending colon. The polyps were       3 to 4 mm in size. These polyps were removed with a cold biopsy forceps.       Resection and retrieval were complete.      Two sessile polyps were found in the transverse colon. The polyps were 3       to 4 mm in size. These polyps were removed with a cold biopsy forceps.       Resection and retrieval were complete. Impression:           - Three 3 to 4 mm polyps in the ascending colon,                        removed with a cold biopsy forceps. Resected and                        retrieved.                       - Two 3 to 4 mm polyps in the transverse colon, removed                        with a cold biopsy forceps. Resected and retrieved. Recommendation:       - Discharge patient to home.                       - Resume previous diet.                       - Continue present medications.                       - Await pathology results.                       - Repeat colonoscopy in 5 years if polyp adenoma and 10  years if hyperplastic Procedure Code(s):    --- Professional ---                       [phone], Colonoscopy, flexible; with biopsy, single or                        multiple Diagnosis Code(s):    --- Professional ---                       Z12.11, Encounter for screening for malignant neoplasm                        of colon                       D12.2, Benign neoplasm of ascending colon                       D12.3, Benign neoplasm of transverse colon (hepatic                         flexure or splenic flexure) CPT copyright 2016 American Medical Association. All rights reserved. The codes documented in this report are preliminary and upon coder review may  be revised to meet current compliance requirements. Lucilla Lame MD, MD 12/01/2016 10:48:52 AM This report has been signed electronically. Number of Addenda: 0 Note Initiated On: 12/01/2016 10:28 AM Scope Withdrawal Time: 0 hours 8 minutes 10 seconds  Total Procedure Duration: 0 hours 11 minutes 57 seconds       [facility]

## 2016-12-01 NOTE — Transfer of Care (Signed)
Immediate Anesthesia Transfer of Care Note  Patient: Donna Lara  Procedure(s) Performed: Procedure(s) with comments: COLONOSCOPY WITH PROPOFOL (N/A) POLYPECTOMY INTESTINAL - Transverse colon polyp x 2 Ascending colon polyp x 3  Patient Location: PACU  Anesthesia Type: General  Level of Consciousness: awake, alert  and patient cooperative  Airway and Oxygen Therapy: Patient Spontanous Breathing and Patient connected to supplemental oxygen  Post-op Assessment: Post-op Vital signs reviewed, Patient's Cardiovascular Status Stable, Respiratory Function Stable, Patent Airway and No signs of Nausea or vomiting  Post-op Vital Signs: Reviewed and stable  Complications: No apparent anesthesia complications

## 2016-12-01 NOTE — Anesthesia Preprocedure Evaluation (Signed)
Anesthesia Evaluation  Patient identified by MRN, date of birth, ID band Patient awake    Reviewed: Allergy & Precautions, H&P , NPO status , Patient's Chart, lab work & pertinent test results, reviewed documented beta blocker date and time   Airway Mallampati: II  TM Distance: >3 FB Neck ROM: full    Dental no notable dental hx.    Pulmonary neg pulmonary ROS,    Pulmonary exam normal breath sounds clear to auscultation       Cardiovascular Exercise Tolerance: Good hypertension, negative cardio ROS   Rhythm:regular Rate:Normal     Neuro/Psych PSYCHIATRIC DISORDERS negative neurological ROS     GI/Hepatic Neg liver ROS, GERD  ,  Endo/Other  negative endocrine ROS  Renal/GU negative Renal ROS  negative genitourinary   Musculoskeletal   Abdominal   Peds  Hematology negative hematology ROS (+)   Anesthesia Other Findings   Reproductive/Obstetrics negative OB ROS                             Anesthesia Physical Anesthesia Plan  ASA: II  Anesthesia Plan: General   Post-op Pain Management:    Induction:   PONV Risk Score and Plan: Propofol infusion  Airway Management Planned:   Additional Equipment:   Intra-op Plan:   Post-operative Plan:   Informed Consent: I have reviewed the patients History and Physical, chart, labs and discussed the procedure including the risks, benefits and alternatives for the proposed anesthesia with the patient or authorized representative who has indicated his/her understanding and acceptance.   Dental Advisory Given  Plan Discussed with: CRNA  Anesthesia Plan Comments:         Anesthesia Quick Evaluation

## 2016-12-01 NOTE — H&P (Signed)
Lucilla Lame, MD [address] [place], [place] [address] Phone: [phone] Fax : [phone]  Primary Care Physician:  [name], MD Primary Gastroenterologist:  Dr. Allen Norris  Pre-Procedure History & Physical: HPI:  Donna Lara is a 76 y.o. female is here for a screening colonoscopy.   Past Medical History:  Diagnosis Date  . Allergy   . Carpal tunnel syndrome 1999  . GERD (gastroesophageal reflux disease)   . Hypercholesterolemia    By last lipid panel  . Hypertension    Borderline. She does not take the 12.5 HCTZ daily    Past Surgical History:  Procedure Laterality Date  . APPENDECTOMY    . CARPAL TUNNEL RELEASE     right hand  . COLONOSCOPY    . EYE SURGERY     left  . TONSILLECTOMY      Prior to Admission medications   Medication Sig Start Date End Date Taking? Authorizing Provider  ALOE VERA PO Take 1 tablet by mouth daily.    Yes [provider]  aspirin (ASPIR-81) 81 MG EC tablet Take 81 mg by mouth daily. Swallow whole.   Yes [provider]  Cholecalciferol (VITAMIN D3) 1000 UNITS CAPS Take 1,000 Units by mouth 2 (two) times daily.    Yes [provider]  clonazePAM (KLONOPIN) 0.5 MG tablet TAKE 1 TABLET DAILY. 11/07/16  Yes [name], MD  CYANOCOBALAMIN PO Take 1 tablet by mouth daily.    Yes [provider]  MAGNESIUM CITRATE PO Take 1 tablet by mouth daily.    Yes [provider]  Omega-3 Fatty Acids (FISH OIL) 1000 MG CAPS Take 1,000 mg by mouth 2 (two) times daily.    Yes [provider]  Red Yeast Rice Extract (RED YEAST RICE PO) Take 2 capsules by mouth daily.   Yes [provider]  vitamin C (ASCORBIC ACID) 500 MG tablet Take 500 mg by mouth 2 (two) times daily.   Yes [provider]  hydrochlorothiazide (HYDRODIURIL) 12.5 MG tablet Take 12.5 mg by mouth as needed (as needed for edema and hypertension).     [provider]  rosuvastatin (CRESTOR) 5  MG tablet Take 1 tablet (5 mg total) by mouth daily. Patient not taking: Reported on 11/07/2016 11/09/15   [name], MD    Allergies as of 11/20/2016 - Review Complete 11/07/2016  Allergen Reaction Noted  . Codeine sulfate  04/28/2012    Family History  Problem Relation Age of Onset  . Diabetes Mother   . Hypertension Brother   . Diabetes Maternal Grandmother   . Asthma Daughter   . Breast cancer Neg Hx   . Colon cancer Neg Hx     Social History   Social History  . Marital status: Married    Spouse name: N/A  . Number of children: 3  . Years of education: N/A   Occupational History  .  Other   Social History Main Topics  . Smoking status: Never Smoker  . Smokeless tobacco: Never Used  . Alcohol use 0.0 oz/week     Comment: 2-3 drinks/month  . Drug use: No  . Sexual activity: No   Other Topics Concern  . Not on file   Social History Narrative   Recently widowed. Her husband died of a heart attack in 05/11/15    Review of Systems: See HPI, otherwise negative ROS  Physical Exam: BP 138/84   Pulse 70   Resp 16  Ht 5' 1.25" (1.556 m)   Wt 136 lb (61.7 kg)   LMP 04/28/1992   SpO2 99%   BMI 25.49 kg/m  General:   Alert,  pleasant and cooperative in NAD Head:  Normocephalic and atraumatic. Neck:  Supple; no masses or thyromegaly. Lungs:  Clear throughout to auscultation.    Heart:  Regular rate and rhythm. Abdomen:  Soft, nontender and nondistended. Normal bowel sounds, without guarding, and without rebound.   Neurologic:  Alert and  oriented x4;  grossly normal neurologically.  Impression/Plan: Donna Lara is now here to undergo a screening colonoscopy.  Risks, benefits, and alternatives regarding colonoscopy have been reviewed with the patient.  Questions have been answered.  All parties agreeable.

## 2016-12-02 ENCOUNTER — Encounter: Payer: Self-pay | Admitting: Gastroenterology

## 2016-12-30 ENCOUNTER — Other Ambulatory Visit: Payer: Self-pay | Admitting: Internal Medicine

## 2016-12-30 NOTE — Telephone Encounter (Signed)
Has appt at the beginning of October, please advise, thanks

## 2017-01-12 ENCOUNTER — Other Ambulatory Visit (INDEPENDENT_AMBULATORY_CARE_PROVIDER_SITE_OTHER): Payer: Medicare Other

## 2017-01-12 DIAGNOSIS — E7800 Pure hypercholesterolemia, unspecified: Secondary | ICD-10-CM

## 2017-01-12 DIAGNOSIS — R739 Hyperglycemia, unspecified: Secondary | ICD-10-CM

## 2017-01-12 DIAGNOSIS — I10 Essential (primary) hypertension: Secondary | ICD-10-CM

## 2017-01-12 LAB — HEPATIC FUNCTION PANEL
ALK PHOS: 78 U/L (ref 39–117)
ALT: 14 U/L (ref 0–35)
AST: 16 U/L (ref 0–37)
Albumin: 4.2 g/dL (ref 3.5–5.2)
BILIRUBIN DIRECT: 0.1 mg/dL (ref 0.0–0.3)
TOTAL PROTEIN: 7 g/dL (ref 6.0–8.3)
Total Bilirubin: 0.6 mg/dL (ref 0.2–1.2)

## 2017-01-12 LAB — HEMOGLOBIN A1C: Hgb A1c MFr Bld: 5.8 % (ref 4.6–6.5)

## 2017-01-12 LAB — BASIC METABOLIC PANEL
BUN: 14 mg/dL (ref 6–23)
CHLORIDE: 105 meq/L (ref 96–112)
CO2: 27 mEq/L (ref 19–32)
Calcium: 9.7 mg/dL (ref 8.4–10.5)
Creatinine, Ser: 0.83 mg/dL (ref 0.40–1.20)
GFR: 71.44 mL/min (ref >60.00)
Glucose, Bld: 99 mg/dL (ref 70–99)
POTASSIUM: 4.6 meq/L (ref 3.5–5.1)
Sodium: 139 mEq/L (ref 135–145)

## 2017-01-12 LAB — LIPID PANEL
CHOL/HDL RATIO: 3
Cholesterol: 207 mg/dL — ABNORMAL HIGH (ref 0–200)
HDL: 63.8 mg/dL (ref >39.00)
LDL Cholesterol: 125 mg/dL — ABNORMAL HIGH (ref 0–99)
NONHDL: 143.09
Triglycerides: 91 mg/dL (ref 0.0–149.0)
VLDL: 18.2 mg/dL (ref 0.0–40.0)

## 2017-01-15 ENCOUNTER — Ambulatory Visit: Payer: Federal, State, Local not specified - PPO | Admitting: Internal Medicine

## 2017-01-20 ENCOUNTER — Telehealth: Payer: Self-pay

## 2017-01-20 MED ORDER — ROSUVASTATIN CALCIUM 10 MG PO TABS: 10.0000 mg | ORAL_TABLET | Freq: Every day | ORAL | 3 refills | Status: DC

## 2017-01-20 NOTE — Telephone Encounter (Signed)
Per Dr. Nicki Reaper she wanted patient to be informed follow on labs. She had f/u but cancelled. Per note from Dr. Nicki Reaper patient informed that Cholesterol is relatively stable from previous check. With calculated cholesterol risk patient has agreed to increase Crestor to 10mg  day. She was informed that sugar control is stable and all other labs ok. I have sent new script and changed med list.

## 2017-02-09 ENCOUNTER — Other Ambulatory Visit: Payer: Self-pay | Admitting: Internal Medicine

## 2017-02-09 NOTE — Telephone Encounter (Signed)
Faxed

## 2017-02-09 NOTE — Telephone Encounter (Signed)
Last script 12/30/16 #30 no refill  Last o/v 03/14/16 F/u 04/09/17

## 2017-03-18 DIAGNOSIS — L821 Other seborrheic keratosis: Secondary | ICD-10-CM | POA: Diagnosis not present

## 2017-03-18 DIAGNOSIS — R58 Hemorrhage, not elsewhere classified: Secondary | ICD-10-CM | POA: Diagnosis not present

## 2017-03-18 DIAGNOSIS — L820 Inflamed seborrheic keratosis: Secondary | ICD-10-CM | POA: Diagnosis not present

## 2017-03-18 DIAGNOSIS — L298 Other pruritus: Secondary | ICD-10-CM | POA: Diagnosis not present

## 2017-04-09 ENCOUNTER — Other Ambulatory Visit: Payer: Self-pay | Admitting: Internal Medicine

## 2017-04-09 ENCOUNTER — Encounter: Payer: Self-pay | Admitting: Internal Medicine

## 2017-04-09 ENCOUNTER — Ambulatory Visit (INDEPENDENT_AMBULATORY_CARE_PROVIDER_SITE_OTHER): Payer: Medicare Other | Admitting: Internal Medicine

## 2017-04-09 DIAGNOSIS — E7800 Pure hypercholesterolemia, unspecified: Secondary | ICD-10-CM | POA: Diagnosis not present

## 2017-04-09 DIAGNOSIS — F439 Reaction to severe stress, unspecified: Secondary | ICD-10-CM

## 2017-04-09 DIAGNOSIS — R739 Hyperglycemia, unspecified: Secondary | ICD-10-CM | POA: Diagnosis not present

## 2017-04-09 DIAGNOSIS — Z9109 Other allergy status, other than to drugs and biological substances: Secondary | ICD-10-CM

## 2017-04-09 DIAGNOSIS — Z23 Encounter for immunization: Secondary | ICD-10-CM

## 2017-04-09 DIAGNOSIS — I10 Essential (primary) hypertension: Secondary | ICD-10-CM | POA: Diagnosis not present

## 2017-04-09 DIAGNOSIS — Z1231 Encounter for screening mammogram for malignant neoplasm of breast: Secondary | ICD-10-CM

## 2017-04-09 NOTE — Progress Notes (Signed)
[name], female   DOB: 1940/07/13, 77 y.o.   MRN: [id number]   Subjective:    [name], female    DOB: 12-02-1940, 77 y.o.   MRN: [id number]  HPI  Patient here for a scheduled follow up.  She reports she is doing relatively well.  Staying active.  Overall handling stress relatively well.  Takes clonazepam prn to help her relax.  No chest pain.  No sbo.  No acid reflux.  No abdominal pain.  Bowels moving.     Past Medical History:  Diagnosis Date  . Allergy   . Carpal tunnel syndrome 1999  . GERD (gastroesophageal reflux disease)   . Hypercholesterolemia    By last lipid panel  . Hypertension    Borderline. She does not take the 12.5 HCTZ daily   Past Surgical History:  Procedure Laterality Date  . APPENDECTOMY    . CARPAL TUNNEL RELEASE     right hand  . COLONOSCOPY    . COLONOSCOPY WITH PROPOFOL N/A 12/01/2016   Procedure: COLONOSCOPY WITH PROPOFOL;  Surgeon: Lucilla Lame, MD;  Location: [facility];  Service: Gastroenterology;  Laterality: N/A;  . EYE SURGERY     left  . POLYPECTOMY  12/01/2016   Procedure: POLYPECTOMY INTESTINAL;  Surgeon: Lucilla Lame, MD;  Location: [facility];  Service: Gastroenterology;;  Transverse colon polyp x 2 Ascending colon polyp x 3  . TONSILLECTOMY     Family History  Problem Relation Age of Onset  . Diabetes Mother   . Hypertension Brother   . Diabetes Maternal Grandmother   . Asthma Daughter   . Breast cancer Neg Hx   . Colon cancer Neg Hx    Social History   Socioeconomic History  . Marital status: Married    Spouse name: None  . Number of children: 3  . Years of education: None  . Highest education level: None  Social Needs  . Financial resource strain: None  . Food insecurity - worry: None  . Food insecurity - inability: None  . Transportation needs - medical: None  . Transportation needs - non-medical: None  Occupational History    Employer: OTHER  Tobacco Use  . Smoking status:  Never Smoker  . Smokeless tobacco: Never Used  Substance and Sexual Activity  . Alcohol use: Yes    Alcohol/week: 0.0 oz    Comment: 2-3 drinks/month  . Drug use: No  . Sexual activity: No  Other Topics Concern  . None  Social History Narrative   Recently widowed. Her husband died of a heart attack in 05/13/2015    Outpatient Encounter Medications as of 04/09/2017  Medication Sig  . ALOE VERA PO Take 1 tablet by mouth daily.   Marland Kitchen aspirin (ASPIR-81) 81 MG EC tablet Take 81 mg by mouth daily. Swallow whole.  . Cholecalciferol (VITAMIN D3) 1000 UNITS CAPS Take 1,000 Units by mouth 2 (two) times daily.   . clonazePAM (KLONOPIN) 0.5 MG tablet TAKE 1 TABLET DAILY.  Marland Kitchen CYANOCOBALAMIN PO Take 1 tablet by mouth daily.   . hydrochlorothiazide (HYDRODIURIL) 12.5 MG tablet Take 12.5 mg by mouth as needed (as needed for edema and hypertension).   Marland Kitchen MAGNESIUM CITRATE PO Take 1 tablet by mouth daily.   . Omega-3 Fatty Acids (FISH OIL) 1000 MG CAPS Take 1,000 mg by mouth 2 (two) times daily.   . Red Yeast Rice Extract (RED YEAST RICE PO) Take 2 capsules by mouth daily.  Marland Kitchen  rosuvastatin (CRESTOR) 10 MG tablet Take 1 tablet (10 mg total) by mouth daily.  . vitamin C (ASCORBIC ACID) 500 MG tablet Take 500 mg by mouth 2 (two) times daily.   No facility-administered encounter medications on file as of 04/09/2017.     Review of Systems  Constitutional: Negative for appetite change and unexpected weight change.  HENT: Negative for congestion and sinus pressure.   Respiratory: Negative for cough, chest tightness and shortness of breath.   Cardiovascular: Negative for chest pain, palpitations and leg swelling.  Gastrointestinal: Negative for abdominal pain, diarrhea, nausea and vomiting.  Genitourinary: Negative for difficulty urinating and dysuria.  Musculoskeletal: Negative for joint swelling and myalgias.  Skin: Negative for color change and rash.  Neurological: Negative for dizziness, light-headedness  and headaches.  Psychiatric/Behavioral: Negative for agitation and dysphoric mood.       Objective:    Physical Exam  Constitutional: She appears well-developed and well-nourished. No distress.  HENT:  Nose: Nose normal.  Mouth/Throat: Oropharynx is clear and moist.  Neck: Neck supple. No thyromegaly present.  Cardiovascular: Normal rate and regular rhythm.  Pulmonary/Chest: Breath sounds normal. No respiratory distress. She has no wheezes.  Abdominal: Soft. Bowel sounds are normal. There is no tenderness.  Musculoskeletal: She exhibits no edema or tenderness.  Lymphadenopathy:    She has no cervical adenopathy.  Skin: No rash noted. No erythema.  Psychiatric: She has a normal mood and affect. Her behavior is normal.    BP 138/76   Pulse 79   Temp 98.3 F (36.8 C) (Oral)   Ht 5' 1.25" (1.556 m)   Wt 137 lb (62.1 kg)   LMP 04/28/1992   SpO2 98%   BMI 25.68 kg/m  Wt Readings from Last 3 Encounters:  04/09/17 137 lb (62.1 kg)  12/01/16 136 lb (61.7 kg)  11/07/16 139 lb 6.4 oz (63.2 kg)     Lab Results  Component Value Date   WBC 6.7 11/05/2015   HGB 14.4 11/05/2015   HCT 43.1 11/05/2015   PLT 248.0 11/05/2015   GLUCOSE 99 01/12/2017   CHOL 207 (H) 01/12/2017   TRIG 91.0 01/12/2017   HDL 63.80 01/12/2017   LDLCALC 125 (H) 01/12/2017   ALT 14 01/12/2017   AST 16 01/12/2017   NA 139 01/12/2017   K 4.6 01/12/2017   CL 105 01/12/2017   CREATININE 0.83 01/12/2017   BUN 14 01/12/2017   CO2 27 01/12/2017   TSH 1.21 06/08/2015   HGBA1C 5.8 01/12/2017       Assessment & Plan:   Problem List Items Addressed This Visit    Environmental allergies    Stable.  Follow.       Hypercholesterolemia    On crestor.  Low cholesterol diet and exercise.  Follow lipid panel and liver function tests.        Relevant Orders   Hepatic function panel   Lipid panel   Hyperglycemia    Low carb diet and exercise.  Follow met b and a1c.        Relevant Orders    Hemoglobin A1c   Hypertension    Blood pressure has been under reasonable control.  Continue same medication regimen.  Follow pressures.  Follow metabolic panel.       Relevant Orders   CBC with Differential/Platelet   TSH   Basic metabolic panel   Stress    Overall doing relatively well.  Discussed with her today.  Follow.  [name], MD

## 2017-04-12 ENCOUNTER — Encounter: Payer: Self-pay | Admitting: Internal Medicine

## 2017-04-12 NOTE — Assessment & Plan Note (Signed)
Overall doing relatively well.  Discussed with her today.  Follow.

## 2017-04-12 NOTE — Assessment & Plan Note (Signed)
Low carb diet and exercise.  Follow met b and a1c.   

## 2017-04-12 NOTE — Assessment & Plan Note (Signed)
Blood pressure has been under reasonable control.  Continue same medication regimen.  Follow pressures.  Follow metabolic panel.  

## 2017-04-12 NOTE — Assessment & Plan Note (Signed)
Stable.  Follow.   

## 2017-04-12 NOTE — Assessment & Plan Note (Signed)
On crestor.  Low cholesterol diet and exercise.  Follow lipid panel and liver function tests.   

## 2017-04-16 ENCOUNTER — Other Ambulatory Visit: Payer: Federal, State, Local not specified - PPO

## 2017-04-22 ENCOUNTER — Other Ambulatory Visit: Payer: Self-pay | Admitting: Internal Medicine

## 2017-04-23 NOTE — Telephone Encounter (Signed)
ok'd rx for clonazepam #30 with one refill.  rx signed and placed in box.

## 2017-04-23 NOTE — Telephone Encounter (Signed)
Okay to refill? Last written on: 02/09/17 for #30 with no refill  LOV: 04/09/17 NOV: 07/03/17

## 2017-04-27 NOTE — Telephone Encounter (Signed)
faxed

## 2017-05-05 ENCOUNTER — Other Ambulatory Visit (INDEPENDENT_AMBULATORY_CARE_PROVIDER_SITE_OTHER): Payer: Medicare Other

## 2017-05-05 DIAGNOSIS — I10 Essential (primary) hypertension: Secondary | ICD-10-CM

## 2017-05-05 DIAGNOSIS — E7800 Pure hypercholesterolemia, unspecified: Secondary | ICD-10-CM | POA: Diagnosis not present

## 2017-05-05 DIAGNOSIS — R739 Hyperglycemia, unspecified: Secondary | ICD-10-CM

## 2017-05-05 LAB — CBC WITH DIFFERENTIAL/PLATELET
Basophils Absolute: 0.1 10*3/uL (ref 0.0–0.1)
Basophils Relative: 1 % (ref 0.0–3.0)
EOS ABS: 0.1 10*3/uL (ref 0.0–0.7)
EOS PCT: 2.2 % (ref 0.0–5.0)
HCT: 44.1 % (ref 36.0–46.0)
Hemoglobin: 14.7 g/dL (ref 12.0–15.0)
LYMPHS ABS: 2.1 10*3/uL (ref 0.7–4.0)
Lymphocytes Relative: 31.1 % (ref 12.0–46.0)
MCHC: 33.4 g/dL (ref 30.0–36.0)
MCV: 89 fl (ref 78.0–100.0)
MONO ABS: 0.7 10*3/uL (ref 0.1–1.0)
Monocytes Relative: 10.6 % (ref 3.0–12.0)
NEUTROS PCT: 55.1 % (ref 43.0–77.0)
Neutro Abs: 3.7 10*3/uL (ref 1.4–7.7)
Platelets: 235 10*3/uL (ref 150.0–400.0)
RBC: 4.96 Mil/uL (ref 3.87–5.11)
RDW: 13.3 % (ref 11.5–15.5)
WBC: 6.8 10*3/uL (ref 4.0–10.5)

## 2017-05-05 LAB — HEPATIC FUNCTION PANEL
ALK PHOS: 77 U/L (ref 39–117)
ALT: 15 U/L (ref 0–35)
AST: 21 U/L (ref 0–37)
Albumin: 4.2 g/dL (ref 3.5–5.2)
BILIRUBIN TOTAL: 0.8 mg/dL (ref 0.2–1.2)
Bilirubin, Direct: 0.1 mg/dL (ref 0.0–0.3)
Total Protein: 7.1 g/dL (ref 6.0–8.3)

## 2017-05-05 LAB — POCT GLYCOSYLATED HEMOGLOBIN (HGB A1C): HEMOGLOBIN A1C: 5.4

## 2017-05-05 LAB — LIPID PANEL
Cholesterol: 201 mg/dL — ABNORMAL HIGH (ref 0–200)
HDL: 61.2 mg/dL (ref >39.00)
LDL Cholesterol: 125 mg/dL — ABNORMAL HIGH (ref 0–99)
NONHDL: 140.22
Total CHOL/HDL Ratio: 3
Triglycerides: 78 mg/dL (ref 0.0–149.0)
VLDL: 15.6 mg/dL (ref 0.0–40.0)

## 2017-05-05 LAB — BASIC METABOLIC PANEL
BUN: 17 mg/dL (ref 6–23)
CO2: 28 mEq/L (ref 19–32)
CREATININE: 0.84 mg/dL (ref 0.40–1.20)
Calcium: 9.8 mg/dL (ref 8.4–10.5)
Chloride: 105 mEq/L (ref 96–112)
GFR: 70.4 mL/min (ref >60.00)
Glucose, Bld: 101 mg/dL — ABNORMAL HIGH (ref 70–99)
POTASSIUM: 4.7 meq/L (ref 3.5–5.1)
Sodium: 139 mEq/L (ref 135–145)

## 2017-05-05 LAB — TSH: TSH: 1.63 u[IU]/mL (ref 0.35–4.50)

## 2017-05-05 NOTE — Addendum Note (Signed)
Addended by: Arby Barrette on: 05/05/2017 09:30 AM   Modules accepted: Orders

## 2017-05-06 DIAGNOSIS — B351 Tinea unguium: Secondary | ICD-10-CM | POA: Diagnosis not present

## 2017-05-06 DIAGNOSIS — L570 Actinic keratosis: Secondary | ICD-10-CM | POA: Diagnosis not present

## 2017-05-06 DIAGNOSIS — X32XXXA Exposure to sunlight, initial encounter: Secondary | ICD-10-CM | POA: Diagnosis not present

## 2017-06-08 ENCOUNTER — Encounter: Payer: Self-pay | Admitting: Family Medicine

## 2017-06-08 ENCOUNTER — Other Ambulatory Visit: Payer: Self-pay | Admitting: Internal Medicine

## 2017-06-08 ENCOUNTER — Other Ambulatory Visit: Payer: Self-pay | Admitting: Family Medicine

## 2017-06-08 ENCOUNTER — Ambulatory Visit (INDEPENDENT_AMBULATORY_CARE_PROVIDER_SITE_OTHER): Payer: Medicare Other | Admitting: Family Medicine

## 2017-06-08 VITALS — BP 122/76 | HR 70 | Temp 98.2°F | Wt 138.2 lb

## 2017-06-08 DIAGNOSIS — J309 Allergic rhinitis, unspecified: Secondary | ICD-10-CM | POA: Diagnosis not present

## 2017-06-08 MED ORDER — FLUTICASONE PROPIONATE 50 MCG/ACT NA SUSP: 2.0000 | Freq: Every day | NASAL | 6 refills | Status: DC

## 2017-06-08 MED ORDER — MOMETASONE FUROATE 0.1 % EX SOLN: Freq: Every day | CUTANEOUS | 0 refills | Status: DC

## 2017-06-08 NOTE — Patient Instructions (Signed)
Please take a daily cetirizine (generic Zyrtec) daily as needed   I have sent in a prescription strength nasal spray to your pharmacy, use two sprays in each nostril twice a day for 1 week then once at bedtime  Can also use Afrin type nasal spray twice a day for 3 days   Please follow up with Dr. Nicki Reaper if not better in 7-10 days  Call me with the name of ear drops

## 2017-06-08 NOTE — Telephone Encounter (Signed)
Pt saw Glenda Chroman FNP earlier today and was to cb with name of ear drops.Please advise.

## 2017-06-08 NOTE — Progress Notes (Signed)
Subjective:    [name], female    DOB: 12-19-1940, 77 y.o.   MRN: [id number]  HPI This is a 77 yo female who presents today with several weeks of nasal congestion. Occasional nasal drainage, more congestion. Some pressure behind her eyes, no ear pain, some ear itching, no fever, some fatigue, non productive cough.  Has taken some benadryl and mucinex with some relief.    Past Medical History:  Diagnosis Date  . Allergy   . Carpal tunnel syndrome 1999  . GERD (gastroesophageal reflux disease)   . Hypercholesterolemia    By last lipid panel  . Hypertension    Borderline. She does not take the 12.5 HCTZ daily   Past Surgical History:  Procedure Laterality Date  . APPENDECTOMY    . CARPAL TUNNEL RELEASE     right hand  . COLONOSCOPY    . COLONOSCOPY WITH PROPOFOL N/A 12/01/2016   Procedure: COLONOSCOPY WITH PROPOFOL;  Surgeon: Lucilla Lame, MD;  Location: [facility];  Service: Gastroenterology;  Laterality: N/A;  . EYE SURGERY     left  . POLYPECTOMY  12/01/2016   Procedure: POLYPECTOMY INTESTINAL;  Surgeon: Lucilla Lame, MD;  Location: [facility];  Service: Gastroenterology;;  Transverse colon polyp x 2 Ascending colon polyp x 3  . TONSILLECTOMY     Family History  Problem Relation Age of Onset  . Diabetes Mother   . Hypertension Brother   . Diabetes Maternal Grandmother   . Asthma Daughter   . Breast cancer Neg Hx   . Colon cancer Neg Hx    Social History   Tobacco Use  . Smoking status: Never Smoker  . Smokeless tobacco: Never Used  Substance Use Topics  . Alcohol use: Yes    Alcohol/week: 0.0 oz    Comment: 2-3 drinks/month  . Drug use: No      Review of Systems Per HPI    Objective:   Physical Exam  Constitutional: She is oriented to person, place, and time. She appears well-developed and well-nourished. No distress.  HENT:  Head: Normocephalic and atraumatic.  Right Ear: Tympanic membrane and external ear normal.    Left Ear: Tympanic membrane and external ear normal.  Nose: Mucosal edema and rhinorrhea present.  Mouth/Throat: Uvula is midline, oropharynx is clear and moist and mucous membranes are normal.  Canals mildly flaky.   Eyes: Conjunctivae are normal.  Neck: Normal range of motion. Neck supple.  Cardiovascular: Normal rate, regular rhythm and normal heart sounds.  Pulmonary/Chest: Effort normal and breath sounds normal.  Occasional dry cough.   Neurological: She is alert and oriented to person, place, and time.  Skin: Skin is warm and dry. She is not diaphoretic.  Psychiatric: She has a normal mood and affect. Her behavior is normal. Judgment and thought content normal.  Vitals reviewed.     BP 122/76   Pulse 70   Temp 98.2 F (36.8 C) (Oral)   Wt 138 lb 4 oz (62.7 kg)   LMP 04/28/1992   SpO2 97%   BMI 25.91 kg/m  Wt Readings from Last 3 Encounters:  06/08/17 138 lb 4 oz (62.7 kg)  04/09/17 137 lb (62.1 kg)  12/01/16 136 lb (61.7 kg)       Assessment & Plan:  1. Allergic rhinitis, unspecified seasonality, unspecified trigger -  Patient Instructions  Please take a daily cetirizine (generic Zyrtec) daily as needed   I have sent in a prescription strength nasal spray  to your pharmacy, use two sprays in each nostril twice a day for 1 week then once at bedtime  Can also use Afrin type nasal spray twice a day for 3 days   Please follow up with Dr. Nicki Reaper if not better in 7-10 days  Call me with the name of ear drops  - fluticasone (FLONASE) 50 MCG/ACT nasal spray; Place 2 sprays into both nostrils daily.  Dispense: 16 g; Refill: [place], FNP-BC  [facility], [place]  06/08/2017 2:22 PM

## 2017-06-08 NOTE — Telephone Encounter (Signed)
Please let her know that I have sent in the prescription.

## 2017-06-08 NOTE — Telephone Encounter (Signed)
Copied from [place]. Topic: General - Other >> Jun 08, 2017  3:22 PM Darl Householder, RMA wrote: Reason for CRM: Patient calling to notify Clarene Reamer, FNP of medication that she wants filled Elocon lotion 0.1% to be sent to [place], [place]

## 2017-06-09 ENCOUNTER — Ambulatory Visit
Admission: RE | Admit: 2017-06-09 | Discharge: 2017-06-09 | Disposition: A | Discharge status: Home / Self Care | Payer: Medicare Other | Source: Ambulatory Visit | Attending: Internal Medicine | Admitting: Internal Medicine

## 2017-06-09 DIAGNOSIS — Z1231 Encounter for screening mammogram for malignant neoplasm of breast: Secondary | ICD-10-CM

## 2017-06-09 NOTE — Telephone Encounter (Signed)
Lm on pts vm and advised Rx sent to pharmacy 

## 2017-06-11 ENCOUNTER — Telehealth: Payer: Self-pay | Admitting: Internal Medicine

## 2017-06-11 DIAGNOSIS — N8110 Cystocele, unspecified: Secondary | ICD-10-CM

## 2017-06-11 NOTE — Telephone Encounter (Signed)
Please advise 

## 2017-06-11 NOTE — Telephone Encounter (Signed)
Copied from [place]. Topic: Referral - Request >> Jun 11, 2017  3:45 PM Darl Householder, RMA wrote: Reason for CRM: Patient is requesting a  referral for urology in the [place] area, please call pt

## 2017-06-12 NOTE — Telephone Encounter (Signed)
Patient is requesting referral to urology to [facility] with Larene Beach McGowin for her bladder prolapse. She says that you are aware about this issue.

## 2017-06-14 NOTE — Telephone Encounter (Signed)
Order placed for urology referral.  

## 2017-06-21 NOTE — Progress Notes (Signed)
06/22/2017 3:50 PM   Donna Lara March 06, 1941 [id number]  Referring provider: [name], MD [address] [place], [place] [address]  Chief Complaint  Patient presents with  . Bladder Prolapse    HPI: Patient is a 77 -year-old Caucasian female who is referred to [place] by Dr. [name] for bladder prolapse.    She has had an uncomfortable sensation with her bladder for the last several months.  She is not noted urinary frequency, urgency, dysuria, nocturia, incontinence, intermittency, hesitancy, straining to urinate or weak urinary stream.  She just feels her bladder in between her legs and it pinches sometimes.   Patient denies any gross hematuria or suprapubic/flank pain.  Patient denies any fevers, chills, nausea or vomiting.   Her PVR is 14 mL.    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   She does not have a history of urinary tract infections, STI's or injury to the bladder.   She does not have a history of nephrolithiasis, GU surgery or GU trauma.   She is post menopausal.   She admits to constipation.   PMH: Past Medical History:  Diagnosis Date  . Allergy   . Carpal tunnel syndrome 1999  . GERD (gastroesophageal reflux disease)   . Hypercholesterolemia    By last lipid panel  . Hypertension    Borderline. She does not take the 12.5 HCTZ daily    Surgical History: Past Surgical History:  Procedure Laterality Date  . APPENDECTOMY    . CARPAL TUNNEL RELEASE     right hand  . COLONOSCOPY    . COLONOSCOPY WITH PROPOFOL N/A 12/01/2016   Procedure: COLONOSCOPY WITH PROPOFOL;  Surgeon: Lucilla Lame, MD;  Location: [facility];  Service: Gastroenterology;  Laterality: N/A;  . EYE SURGERY     left  . POLYPECTOMY  12/01/2016   Procedure: POLYPECTOMY INTESTINAL;   Surgeon: Lucilla Lame, MD;  Location: [facility];  Service: Gastroenterology;;  Transverse colon polyp x 2 Ascending colon polyp x 3  . TONSILLECTOMY      Home Medications:  Allergies as of 06/22/2017      Reactions   Codeine Sulfate    Insomnia. Out of control.      Medication List        Accurate as of 06/22/17  3:50 PM. Always use your most recent med list.          ALOE VERA PO Take 1 tablet by mouth daily.   ASPIR-81 81 MG EC tablet Generic drug:  aspirin Take 81 mg by mouth daily. Swallow whole.   clonazePAM 0.5 MG tablet Commonly known as:  KLONOPIN TAKE 1 TABLET DAILY.   conjugated estrogens vaginal cream Commonly known as:  PREMARIN Place 1 Applicatorful vaginally daily. Apply 0.5mg  (pea-sized amount)  just inside the vaginal introitus with a finger-tip on  Monday, Wednesday and Friday nights.   CYANOCOBALAMIN PO Take 1 tablet by mouth daily.   estradiol 0.1 MG/GM vaginal cream Commonly known as:  ESTRACE VAGINAL Apply 0.5mg  (pea-sized amount)  just inside the vaginal introitus with a finger-tip on Monday, Wednesday and Friday nights.   Fish Oil 1000 MG Caps Take 1,000 mg by mouth 2 (two) times daily.   fluticasone 50 MCG/ACT nasal spray Commonly known as:  FLONASE Place 2 sprays into both nostrils daily.   hydrochlorothiazide 12.5 MG tablet Commonly known as:  HYDRODIURIL Take 12.5 mg by mouth as needed (as needed for edema and hypertension).   MAGNESIUM CITRATE PO Take 1 tablet by mouth daily.   mometasone 0.1 % lotion Commonly known as:  ELOCON Apply topically daily. For no more than 7 days.   RED YEAST RICE PO Take 2 capsules by mouth daily.   rosuvastatin 10 MG tablet Commonly known as:  CRESTOR Take 1 tablet (10 mg total) by mouth daily.   vitamin C 500 MG tablet Commonly known as:  ASCORBIC ACID Take 500 mg by mouth 2 (two) times daily.   Vitamin D3 1000 units Caps Take 1,000 Units by mouth 2 (two) times daily.        Allergies:  Allergies  Allergen Reactions  . Codeine Sulfate     Insomnia. Out of control.    Family History: Family History  Problem Relation Age of Onset  . Diabetes Mother   . Hypertension Brother   . Diabetes Maternal Grandmother   . Asthma Daughter   . Breast cancer Other   . Colon cancer Neg Hx     Social History:  reports that  has never smoked. she has never used smokeless tobacco. She reports that she drinks alcohol. She reports that she does not use drugs.  ROS: UROLOGY Frequent Urination?: No Hard to postpone urination?: No Burning/pain with urination?: No Get up at night to urinate?: No Leakage of urine?: No Urine stream starts and stops?: No Trouble starting stream?: No Do you have to strain to urinate?: No Blood in urine?: No Urinary tract infection?: No Sexually transmitted disease?: No Injury to kidneys or bladder?: No Painful intercourse?: No Weak stream?: No Currently pregnant?: No Vaginal bleeding?: No Last menstrual period?: n  Gastrointestinal Nausea?: No Vomiting?: No Indigestion/heartburn?: No Diarrhea?: No Constipation?: Yes  Constitutional Fever: No Night sweats?: No Weight loss?: No Fatigue?: No  Skin Skin rash/lesions?: No Itching?: No  Eyes Blurred vision?: No Double vision?: No  Ears/Nose/Throat Sore throat?: No Sinus problems?: Yes  Hematologic/Lymphatic Swollen glands?: No Easy bruising?: No  Cardiovascular Leg swelling?: No Chest pain?: No  Respiratory Cough?: No Shortness of breath?: No  Endocrine Excessive thirst?: No  Musculoskeletal Back pain?: No Joint pain?: Yes  Neurological Headaches?: No Dizziness?: No  Psychologic Depression?: No Anxiety?: No  Physical Exam: BP (!) 144/80 (BP Location: Left Arm, Patient Position: Sitting, Cuff Size: Normal)   Pulse 78   Ht 5' 1.25" (1.556 m)   Wt 138 lb 1.6 oz (62.6 kg)   LMP 04/28/1992   BMI 25.88 kg/m   Constitutional: Well nourished.  Alert and oriented, No acute distress. HEENT: [place] AT, moist mucus membranes. Trachea midline, no masses. Cardiovascular: No clubbing, cyanosis, or edema. Respiratory: Normal respiratory effort, no increased work of breathing. GI: Abdomen is soft, non tender, non distended, no abdominal masses. Liver and spleen not palpable.  No hernias appreciated.  Stool sample for occult testing is not indicated.   GU: No CVA tenderness.  No bladder fullness or masses.  Atrophic external genitalia, normal pubic hair distribution, no lesions.  Normal urethral meatus, no lesions, no prolapse, no discharge.   No urethral masses, tenderness and/or tenderness. No bladder fullness, tenderness or masses. Pale vagina mucosa, poor estrogen effect, no discharge, no lesions, poor pelvic support, Grade II cystocele noted.  No rectocele noted.  No cervical motion tenderness.  Uterus is freely mobile and  non-fixed.  No adnexal/parametria masses or tenderness noted.  Anus and perineum are without rashes or lesions.    Skin: No rashes, bruises or suspicious lesions. Lymph: No cervical or inguinal adenopathy. Neurologic: Grossly intact, no focal deficits, moving all 4 extremities. Psychiatric: Normal mood and affect.  Laboratory Data: Lab Results  Component Value Date   WBC 6.8 05/05/2017   HGB 14.7 05/05/2017   HCT 44.1 05/05/2017   MCV 89.0 05/05/2017   PLT 235.0 05/05/2017    Lab Results  Component Value Date   CREATININE 0.84 05/05/2017    No results found for: PSA  No results found for: TESTOSTERONE  Lab Results  Component Value Date   HGBA1C 5.4 05/05/2017    Lab Results  Component Value Date   TSH 1.63 05/05/2017       Component Value Date/Time   CHOL 201 (H) 05/05/2017 0923   HDL 61.20 05/05/2017 0923   CHOLHDL 3 05/05/2017 0923   VLDL 15.6 05/05/2017 0923   LDLCALC 125 (H) 05/05/2017 0923    Lab Results  Component Value Date   AST 21 05/05/2017   Lab Results  Component Value Date    ALT 15 05/05/2017   No components found for: ALKALINEPHOPHATASE No components found for: BILIRUBINTOTAL  No results found for: ESTRADIOL  Urinalysis No results found for: COLORURINE, APPEARANCEUR, LABSPEC, PHURINE, GLUCOSEU, HGBUR, BILIRUBINUR, KETONESUR, PROTEINUR, UROBILINOGEN, NITRITE, LEUKOCYTESUR  I have reviewed the labs.   Pertinent Imaging: Results for HARLA, MENSCH (MRN [id number]) as of 07/14/2017 17:46  Ref. Range 06/22/2017 15:24  Scan Result Unknown 25ml     Assessment & Plan:    1. Cystocele  - offered behavioral therapies, bladder training, bladder control strategies and pelvic floor muscle training - patient would like a referral to PT  - offered refer to gynecology for a pessary fitting - patient would like a referral   - RTC in 3 months for PVR and symptom recheck   2. Vaginal atrophy Patient was given a sample of vaginal estrogen cream (Premarin vaginal cream) and instructed to apply 0.5mg  (pea-sized amount)  just inside the vaginal introitus with a finger-tip on Monday, Wednesday and Friday nights.  I explained to the patient that vaginally administered estrogen, which causes only a slight increase in the blood estrogen levels, have fewer contraindications and adverse systemic effects that oral HT. I have also given prescriptions for the Estrace cream and Premarin cream, so that the patient may carry them to the pharmacy to see which one of the branded creams would be most economical for her.  If she finds both medications cost prohibitive, she is instructed to call the office.  We can then call in a compounded vaginal estrogen cream for the patient that may be more affordable.   She will follow up in three months for an exam.     Return in about 3 months (around 09/22/2017) for exam.  These notes generated with voice recognition software. I apologize for typographical errors.  Zara Council, [facility] [address] [place], [place] [address] [phone]

## 2017-06-22 ENCOUNTER — Encounter: Payer: Self-pay | Admitting: Urology

## 2017-06-22 ENCOUNTER — Ambulatory Visit (INDEPENDENT_AMBULATORY_CARE_PROVIDER_SITE_OTHER): Payer: Medicare Other | Admitting: Urology

## 2017-06-22 VITALS — BP 144/80 | HR 78 | Ht 61.25 in | Wt 138.1 lb

## 2017-06-22 DIAGNOSIS — N952 Postmenopausal atrophic vaginitis: Secondary | ICD-10-CM | POA: Diagnosis not present

## 2017-06-22 DIAGNOSIS — N8111 Cystocele, midline: Secondary | ICD-10-CM | POA: Diagnosis not present

## 2017-06-22 LAB — BLADDER SCAN AMB NON-IMAGING

## 2017-06-22 MED ORDER — ESTROGENS, CONJUGATED 0.625 MG/GM VA CREA: 1.0000 | TOPICAL_CREAM | Freq: Every day | VAGINAL | 12 refills | Status: DC

## 2017-06-22 MED ORDER — ESTRADIOL 0.1 MG/GM VA CREA: TOPICAL_CREAM | VAGINAL | 12 refills | Status: DC

## 2017-06-23 ENCOUNTER — Telehealth: Payer: Self-pay | Admitting: Obstetrics & Gynecology

## 2017-06-23 NOTE — Telephone Encounter (Signed)
[facility] referring for Cystocele, midline. Called and left voicemail for patient to call back to be schedule

## 2017-06-24 ENCOUNTER — Encounter: Payer: Self-pay | Admitting: Family Medicine

## 2017-06-24 ENCOUNTER — Ambulatory Visit (INDEPENDENT_AMBULATORY_CARE_PROVIDER_SITE_OTHER): Payer: Medicare Other | Admitting: Family Medicine

## 2017-06-24 VITALS — BP 124/68 | HR 78 | Temp 98.3°F | Wt 140.0 lb

## 2017-06-24 DIAGNOSIS — J111 Influenza due to unidentified influenza virus with other respiratory manifestations: Secondary | ICD-10-CM | POA: Diagnosis not present

## 2017-06-24 LAB — POC INFLUENZA A&B (BINAX/QUICKVUE)
INFLUENZA A, POC: POSITIVE — AB
INFLUENZA B, POC: NEGATIVE

## 2017-06-24 MED ORDER — OSELTAMIVIR PHOSPHATE 75 MG PO CAPS: 75.0000 mg | ORAL_CAPSULE | Freq: Two times a day (BID) | ORAL | 0 refills | Status: DC

## 2017-06-24 NOTE — Progress Notes (Signed)
BP 124/68 (BP Location: Left Arm, Patient Position: Sitting, Cuff Size: Normal)   Pulse 78   Temp 98.3 F (36.8 C) (Oral)   Wt 140 lb (63.5 kg)   LMP 04/28/1992   SpO2 97%   BMI 26.24 kg/m    CC: cough Subjective:    [name], female    DOB: January 03, 1941, 77 y.o.   MRN: [id number]  HPI: Donna Lara is a 77 y.o. female presenting on 06/24/2017 for Cough (Dry cough for about 1 wk.  Tried Tylenol and Zquil.) and Generalized Body Aches (Started yesterday, better today. )   1 wk h/o non productive cough, yesterday felt chills, body aches. PNdrainage. Mild head congestion.   No fevers, ear or tooth pain, ST, dyspnea or wheezing. No significant chest congestion.   Treated with 2 tablets of tylenol. Has also tried zzzquil, corcedin. She has been using flonase, recently prescribed for allergic rhinitis.  No h/o asthma. Non smoker Recent trip to [facility] to visit daughter (last week) - via air travel. Daughter was just diagnosed with the flu.  She didn't receive flu shot this year  Relevant past medical, surgical, family and social history reviewed and updated as indicated. Interim medical history since our last visit reviewed. Allergies and medications reviewed and updated. Outpatient Medications Prior to Visit  Medication Sig Dispense Refill  . ALOE VERA PO Take 1 tablet by mouth daily.     . Cholecalciferol (VITAMIN D3) 1000 UNITS CAPS Take 1,000 Units by mouth 2 (two) times daily.     . clonazePAM (KLONOPIN) 0.5 MG tablet TAKE 1 TABLET DAILY. (Patient taking differently: As needed) 30 tablet 1  . conjugated estrogens (PREMARIN) vaginal cream Place 1 Applicatorful vaginally daily. Apply 0.5mg  (pea-sized amount)  just inside the vaginal introitus with a finger-tip on  Monday, Wednesday and Friday nights. 30 g 12  . CYANOCOBALAMIN PO Take 1 tablet by mouth daily.     Marland Kitchen estradiol (ESTRACE VAGINAL) 0.1 MG/GM vaginal cream Apply 0.5mg  (pea-sized amount)  just inside the vaginal  introitus with a finger-tip on Monday, Wednesday and Friday nights. 30 g 12  . fluticasone (FLONASE) 50 MCG/ACT nasal spray Place 2 sprays into both nostrils daily. 16 g 6  . hydrochlorothiazide (HYDRODIURIL) 12.5 MG tablet Take 12.5 mg by mouth as needed (as needed for edema and hypertension).     Marland Kitchen MAGNESIUM CITRATE PO Take 1 tablet by mouth daily.     . mometasone (ELOCON) 0.1 % lotion Apply topically daily. For no more than 7 days. 30 mL 0  . Omega-3 Fatty Acids (FISH OIL) 1000 MG CAPS Take 1,000 mg by mouth 2 (two) times daily.     . Red Yeast Rice Extract (RED YEAST RICE PO) Take 2 capsules by mouth daily.    . vitamin C (ASCORBIC ACID) 500 MG tablet Take 500 mg by mouth 2 (two) times daily.    Marland Kitchen aspirin (ASPIR-81) 81 MG EC tablet Take 81 mg by mouth daily. Swallow whole.    . rosuvastatin (CRESTOR) 10 MG tablet Take 1 tablet (10 mg total) by mouth daily. 90 tablet 3   No facility-administered medications prior to visit.      Per HPI unless specifically indicated in ROS section below Review of Systems     Objective:    BP 124/68 (BP Location: Left Arm, Patient Position: Sitting, Cuff Size: Normal)   Pulse 78   Temp 98.3 F (36.8 C) (Oral)   Wt 140 lb (  63.5 kg)   LMP 04/28/1992   SpO2 97%   BMI 26.24 kg/m   Wt Readings from Last 3 Encounters:  06/24/17 140 lb (63.5 kg)  06/22/17 138 lb 1.6 oz (62.6 kg)  06/08/17 138 lb 4 oz (62.7 kg)    Physical Exam  Constitutional: She appears well-developed and well-nourished. No distress.  HENT:  Head: Normocephalic and atraumatic.  Right Ear: Hearing, tympanic membrane, external ear and ear canal normal.  Left Ear: Hearing, tympanic membrane, external ear and ear canal normal.  Nose: Mucosal edema (nasal mucosal irritation) present. No rhinorrhea. Right sinus exhibits no maxillary sinus tenderness and no frontal sinus tenderness. Left sinus exhibits no maxillary sinus tenderness and no frontal sinus tenderness.  Mouth/Throat: Uvula  is midline, oropharynx is clear and moist and mucous membranes are normal. No oropharyngeal exudate, posterior oropharyngeal edema, posterior oropharyngeal erythema or tonsillar abscesses.  Eyes: Conjunctivae and EOM are normal. Pupils are equal, round, and reactive to light. No scleral icterus.  Neck: Normal range of motion. Neck supple.  Cardiovascular: Normal rate, regular rhythm, normal heart sounds and intact distal pulses.  No murmur heard. Pulmonary/Chest: Effort normal and breath sounds normal. No respiratory distress. She has no wheezes. She has no rales.  Lungs clear  Lymphadenopathy:    She has no cervical adenopathy.  Skin: Skin is warm and dry. No rash noted.  Nursing note and vitals reviewed.  Results for orders placed or performed in visit on 06/24/17  POC Influenza A&B(BINAX/QUICKVUE)  Result Value Ref Range   Influenza A, POC Positive (A) Negative   Influenza B, POC Negative Negative   Lab Results  Component Value Date   CREATININE 0.84 05/05/2017       Assessment & Plan:   Problem List Items Addressed This Visit    Influenza - Primary    Flu exposure, however does not have typical influenza symptoms - check swab today - faintly positive. Discussed with patient ,supportive care reviewed. Discussed risks/benefits of tamiflu - will send in with discussion about filling.  It seems she may be improving already. Red flags to seek further care reviewed.  Handout provided.       Relevant Medications   oseltamivir (TAMIFLU) 75 MG capsule   Other Relevant Orders   POC Influenza A&B(BINAX/QUICKVUE) (Completed)       Meds ordered this encounter  Medications  . oseltamivir (TAMIFLU) 75 MG capsule    Sig: Take 1 capsule (75 mg total) by mouth 2 (two) times daily.    Dispense:  10 capsule    Refill:  0   Orders Placed This Encounter  Procedures  . POC Influenza A&B(BINAX/QUICKVUE)    Follow up plan: Return if symptoms worsen or fail to improve.  Ria Bush, MD

## 2017-06-24 NOTE — Patient Instructions (Addendum)
Flu swab today positive. tamiflu sent to pharmacy.  Push fluids and rest. May use dimetapp or delsym for cough.  May use ibuprofen 400mg  with meals for next 2-3 days.  Let us know if fever > 101 or worsening productive cough.   Influenza, Adult Influenza, more commonly known as "the flu," is a viral infection that primarily affects the respiratory tract. The respiratory tract includes organs that help you breathe, such as the lungs, nose, and throat. The flu causes many common cold symptoms, as well as a high fever and body aches. The flu spreads easily from person to person (is contagious). Getting a flu shot (influenza vaccination) every year is the best way to prevent influenza. What are the causes? Influenza is caused by a virus. You can catch the virus by:  Breathing in droplets from an infected person's cough or sneeze.  Touching something that was recently contaminated with the virus and then touching your mouth, nose, or eyes.  What increases the risk? The following factors may make you more likely to get the flu:  Not cleaning your hands frequently with soap and water or alcohol-based hand sanitizer.  Having close contact with many people during cold and flu season.  Touching your mouth, eyes, or nose without washing or sanitizing your hands first.  Not drinking enough fluids or not eating a healthy diet.  Not getting enough sleep or exercise.  Being under a high amount of stress.  Not getting a yearly (annual) flu shot.  You may be at a higher risk of complications from the flu, such as a severe lung infection (pneumonia), if you:  Are over the age of 39.  Are pregnant.  Have a weakened disease-fighting system (immune system). You may have a weakened immune system if you: ? Have HIV or AIDS. ? Are undergoing chemotherapy. ? Aretaking medicines that reduce the activity of (suppress) the immune system.  Have a long-term (chronic) illness, such as heart disease,  kidney disease, diabetes, or lung disease.  Have a liver disorder.  Are obese.  Have anemia.  What are the signs or symptoms? Symptoms of this condition typically last 4-10 days and may include:  Fever.  Chills.  Headache, body aches, or muscle aches.  Sore throat.  Cough.  Runny or congested nose.  Chest discomfort and cough.  Poor appetite.  Weakness or tiredness (fatigue).  Dizziness.  Nausea or vomiting.  How is this diagnosed? This condition may be diagnosed based on your medical history and a physical exam. Your health care provider may do a nose or throat swab test to confirm the diagnosis. How is this treated? If influenza is detected early, you can be treated with antiviral medicine that can reduce the length of your illness and the severity of your symptoms. This medicine may be given by mouth (orally) or through an IV tube that is inserted in one of your veins. The goal of treatment is to relieve symptoms by taking care of yourself at home. This may include taking over-the-counter medicines, drinking plenty of fluids, and adding humidity to the air in your home. In some cases, influenza goes away on its own. Severe influenza or complications from influenza may be treated in a hospital. Follow these instructions at home:  Take over-the-counter and prescription medicines only as told by your health care provider.  Use a cool mist humidifier to add humidity to the air in your home. This can make breathing easier.  Rest as needed.  Drink enough  fluid to keep your urine clear or pale yellow.  Cover your mouth and nose when you cough or sneeze.  Wash your hands with soap and water often, especially after you cough or sneeze. If soap and water are not available, use hand sanitizer.  Stay home from work or school as told by your health care provider. Unless you are visiting your health care provider, try to avoid leaving home until your fever has been gone for  24 hours without the use of medicine.  Keep all follow-up visits as told by your health care provider. This is important. How is this prevented?  Getting an annual flu shot is the best way to avoid getting the flu. You may get the flu shot in late summer, fall, or winter. Ask your health care provider when you should get your flu shot.  Wash your hands often or use hand sanitizer often.  Avoid contact with people who are sick during cold and flu season.  Eat a healthy diet, drink plenty of fluids, get enough sleep, and exercise regularly. Contact a health care provider if:  You develop new symptoms.  You have: ? Chest pain. ? Diarrhea. ? A fever.  Your cough gets worse.  You produce more mucus.  You feel nauseous or you vomit. Get help right away if:  You develop shortness of breath or difficulty breathing.  Your skin or nails turn a bluish color.  You have severe pain or stiffness in your neck.  You develop a sudden headache or sudden pain in your face or ear.  You cannot stop vomiting. This information is not intended to replace advice given to you by your health care provider. Make sure you discuss any questions you have with your health care provider. Document Released: 03/21/2000 Document Revised: 08/30/2015 Document Reviewed: 01/16/2015 Elsevier Interactive Patient Education  2017 [employer].

## 2017-06-24 NOTE — Assessment & Plan Note (Addendum)
Flu exposure, however does not have typical influenza symptoms - check swab today - faintly positive. Discussed with patient ,supportive care reviewed. Discussed risks/benefits of tamiflu - will send in with discussion about filling.  It seems she may be improving already. Red flags to seek further care reviewed.  Handout provided.

## 2017-06-25 ENCOUNTER — Encounter: Payer: Medicare Other | Admitting: Obstetrics & Gynecology

## 2017-06-29 ENCOUNTER — Telehealth: Payer: Self-pay | Admitting: Family Medicine

## 2017-06-29 MED ORDER — BENZONATATE 100 MG PO CAPS
100.0000 mg | ORAL_CAPSULE | Freq: Three times a day (TID) | ORAL | 0 refills | Status: DC | PRN
Start: 2017-06-29 — End: 2017-07-25

## 2017-06-29 NOTE — Telephone Encounter (Signed)
plz notify I've sent in tessalon perls for her to try.

## 2017-06-29 NOTE — Telephone Encounter (Signed)
Notified patient.  She thanks everyone involved and will pick up her medication this evening.

## 2017-06-29 NOTE — Telephone Encounter (Signed)
Copied from [place] [id number]. Topic: Quick Communication - See Telephone Encounter >> Jun 29, 2017  2:11 PM Robina Ade, Helene Kelp D wrote: CRM for notification. See Telephone encounter for: 06/29/17. Patient would like to talk to Dr. Danise Mina or his CMA about a bad cough she has from her last visit with him. She would like a stronger medication for that. Please call patient back, thanks.

## 2017-06-29 NOTE — Telephone Encounter (Signed)
I spoke with pt; pt was seen 06/24/17; pt tried robitussin with no effect to stop cough at all. Cough is worse at night after laying down.Productive cough now with grey colored phlegm. No fever, wheezing or SOB. A friend had good results with Tessalon Perle and pt would like to try that. [employer].

## 2017-07-03 ENCOUNTER — Encounter: Payer: Federal, State, Local not specified - PPO | Admitting: Internal Medicine

## 2017-07-08 ENCOUNTER — Ambulatory Visit: Payer: Medicare Other | Attending: Urology

## 2017-07-08 ENCOUNTER — Other Ambulatory Visit: Payer: Self-pay

## 2017-07-08 DIAGNOSIS — N814 Uterovaginal prolapse, unspecified: Secondary | ICD-10-CM | POA: Diagnosis not present

## 2017-07-08 DIAGNOSIS — M6281 Muscle weakness (generalized): Secondary | ICD-10-CM | POA: Insufficient documentation

## 2017-07-08 DIAGNOSIS — R293 Abnormal posture: Secondary | ICD-10-CM | POA: Insufficient documentation

## 2017-07-08 NOTE — Therapy (Signed)
[place] MAIN [facility] SERVICES [address] [place], [place], [address] Phone: [phone]   Fax:  [phone]  Physical Therapy Treatment  Patient Details  Name: Donna Lara MRN: [id number] Date of Birth: [birth year] Referring Provider: Zara Council   Encounter Date: 07/08/2017  PT End of Session - 07/08/17 1019    Visit Number  1    Number of Visits  10    Date for PT Re-Evaluation  09/16/17    Authorization Type  Medicare    Authorization - Visit Number  1    Authorization - Number of Visits  10    PT Start Time  1005    PT Stop Time  1110    PT Time Calculation (min)  65 min    Activity Tolerance  Patient tolerated treatment well    Behavior During Therapy  [facility] for tasks assessed/performed       Past Medical History:  Diagnosis Date  . Allergy   . Carpal tunnel syndrome 1999  . GERD (gastroesophageal reflux disease)   . Hypercholesterolemia    By last lipid panel  . Hypertension    Borderline. She does not take the 12.5 HCTZ daily    Past Surgical History:  Procedure Laterality Date  . APPENDECTOMY  1963  . CARPAL TUNNEL RELEASE     right hand  . COLONOSCOPY    . COLONOSCOPY WITH PROPOFOL N/A 12/01/2016   Procedure: COLONOSCOPY WITH PROPOFOL;  Surgeon: Lucilla Lame, MD;  Location: [place];  Service: Gastroenterology;  Laterality: N/A;  . EYE SURGERY     left  . POLYPECTOMY  12/01/2016   Procedure: POLYPECTOMY INTESTINAL;  Surgeon: Lucilla Lame, MD;  Location: [place];  Service: Gastroenterology;;  Transverse colon polyp x 2 Ascending colon polyp x 3  . TONSILLECTOMY      There were no vitals filed for this visit.      [facility] PT Assessment - 07/08/17 0001      Assessment   Medical Diagnosis  cystocele    Referring Provider  Zara Council    Onset Date/Surgical Date  02/07/17    Next MD Visit  April 2019    Prior Therapy  [place]      Precautions   Precautions  None      Restrictions   Weight Bearing Restrictions  No      Balance Screen   Has the patient fallen in the past 6 months  No    Has the patient had a decrease in activity level because of a fear of falling?   No    Is the patient reluctant to leave their home because of a fear of falling?   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 no Unexplained weight loss? no Saddle anesthesia? no Unexplained changes in bowel or bladder habits?  SUBJECTIVE  Patient reports:  She has some urge incontinence on occasion but mainly is bothered by the bulge/prolapse when she tries to help around the farm bending, lifting, raking or on her feet for a long time. Does notice much pressure first thing in the morning, it is worse when on her feet for a long time, later in the day. 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 She lost her husband about two years ago to a heart attack.  Patient has family history of spinal stenosis. It is aggravated by being on her feet of a long time or "sitting a certain way"  Precautions:  none  Social/Family/Vocational History:   Lives close to her son who helps her run cattle farm. Wants to be able to help more on the farm. Her two daughters live further away.  Recent Procedures/Tests/Findings:  no  Obstetrical History: 3 vaginal  deliveries  Gynecological History: no  Urinary History: Urge incontinence infrequently.  Gastrointestinal History: Takes senna for constipation  Will go daily if she takes senna and eats a lot of raisen bran. Sometimes has to go 2-3 times, the last of the 3 will be really soft.   Sexual activity/pain: Not sexually active and does not feel she could be with the prolapse the way it is but wuld maybe like to have intercourse again someday.  Location of pain: LBP Current pain:  0/10  Max pain:  4/10 Least pain:  0/10 Nature of pain: achy  *increased when trying to "scoot" down table  Patient Goals: Decrease prolapse feeling.   OBJECTIVE  Posture/Observations:  Sitting: anterior pelvic tilt, decreased TA contraction Standing: forward head/shoulders and anterior pelvic tilt.  Palpation/Segmental Motion/Joint Play: Deferred to next visit  Special tests:  Deferred to next visit   Range of Motion/Flexibilty: Deferred to next visit Spine: Hips:   Strength/MMT: Deferred to next visit LE MMT  LE MMT Left Right  Hip flex:  (L2) /5 /5  Hip ext: /5 /5  Hip abd: /5 /5  Hip add: /5 /5  Hip IR /5 /5  Hip ER /5 /5     Abdominal: Deferred to next visit Palpation: Diastasis:  Pelvic Floor External Exam: Introitus Appears: gaping. Skin integrity: mild age-related atrophy Palpation: mild TTP at R STP only Cough: intact but mild increase in prolapse in supine. Prolapse visible?: yes, at level of introitus Scar mobility: N/A  Internal Vaginal Exam: Strength (PERF): 3+/5 Symmetry: greater tension posteriorly Palpation: feels "uncommfortable pressure on R>L through PR anteriorly and through IC posteriorly. Prolapse: visible ~1 cm past the introitus  With bearing down.    Gait Analysis: Deferred to next visit.   Pelvic Floor Outcome Measures:  PFDI: 56/300, PFDI: 29/300  INTERVENTIONS THIS SESSION: Self-care: Educated on the structure and function of the pelvic  floor in relation to their symptoms as well as the POC, and initial HEP in order to set patient expectations and understanding from which we will build on in the future sessions. Educated on the use of squatty potty to decrease constipation and pressure on the PFM and soda-can theory to decrease downward pressure on the PFM and decrease prolapse. Therex: educated on hook-lying pelvic tilt with kegel and hips elevated as a way  to decrease prolapse when it is more noticeable and to strengthen PFM and TA.  Total time: 65 min.                       PT Education - 07/08/17 1145    Education provided  Yes    Education Details  See Pt. Instructions and Interventons this session    Person(s) Educated  Patient    Methods  Explanation;Verbal cues;Handout    Comprehension  Verbalized understanding;Returned demonstration;Verbal cues required;Need further instruction       PT Short Term Goals - 07/08/17 1330      PT SHORT TERM GOAL #1   Title  Patient will demonstrate functional recruitment of TA with breathing, sit-to-stand, squatting/lifting, and walking to allow for improved pelvic brace coordination, improved balance, and decreased downward pressure on the pelvic organs.    Time  5    Period  Weeks    Status  New    Target Date  08/12/17      PT SHORT TERM GOAL #2   Title  Patient will demonstrate improved sitting and standing posture to demonstrate learning and decrease stress on the pelvic floor with functional activity.    Time  5    Status  New    Target Date  08/12/17        PT Long Term Goals - 07/08/17 1331      PT LONG TERM GOAL #1   Title  Patient will report no episodes of urge incontinence over the course of the prior two weeks to demonstrate improved functional ability.    Time  10    Period  Weeks    Status  New    Target Date  09/16/17      PT LONG TERM GOAL #2   Title  Patient will describe feeling of pressure no more than 20% of the time over the  course of the past week to demonstrate improved recruitment and strength of the pelvic floor.    Baseline  80%    Time  10    Period  Weeks    Status  New    Target Date  09/16/17      PT LONG TERM GOAL #3   Title  Patient will score at or below 11/300 on the PFDI and 0/300 on the PFIQ to demonstrate a clinically meaningful decrease in disability and distress due to pelvic floor dysfunction.    Baseline  PFDI: 56/300, PFDI: 29/300    Time  10    Period  Weeks    Status  New    Target Date  09/16/17            Plan - 07/08/17 1146    Clinical Impression Statement  Patient is a 77 y/o female who is G3P3 and presents today with cheif c/o pressure/bulge in the vagina from cystocele with phisical activity, some urge incontinence, and constipation. Her history is significant for an appendectomy and 3 vaginal deliveries. Her clinical exam revealed poor body awareness and mechanics, poor posture, pelvic floor weaknes and minor PFM spasms. Patient will benefit from skilled pelvic PT to address these deficits and continue to assess for underlying causes through screening of the hips and spine.      History and Personal Factors relevant to plan of care:  Husband passed 2 years ago, lives alone    Clinical Presentation  Stable    Clinical Decision Making  Moderate  Rehab Potential  Good    PT Frequency  1x / week    PT Duration  Other (comment) 10 weeks    PT Treatment/Interventions  ADLs/Self Care Home Management;Biofeedback;Electrical Stimulation;Functional mobility training;Therapeutic activities;Therapeutic exercise;Patient/family education;Neuromuscular re-education;Manual techniques;Scar mobilization;Taping    PT Next Visit Plan  assess spine and hips, review pelvic tilt in seated and supine, dicuss posture and do sit-to-stand and squat form training    PT Home Exercise Plan  hook-lying pelvic tilts with kegel, soda-can, squatty potty    Consulted and Agree with Plan of Care  Patient        Patient will benefit from skilled therapeutic intervention in order to improve the following deficits and impairments:  Increased fascial restricitons, Improper body mechanics, Pain, Decreased coordination, Decreased scar mobility, Increased muscle spasms, Postural dysfunction, Decreased endurance, Decreased strength  Visit Diagnosis: Muscle weakness (generalized)  Abnormal posture  Cystocele with prolapse     Problem List Patient Active Problem List   Diagnosis Date Noted  . Influenza 06/24/2017  . Special screening for malignant neoplasms, colon   . Benign neoplasm of ascending colon   . Benign neoplasm of transverse colon   . Cough 08/20/2015  . Viral respiratory infection 08/09/2015  . 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 Osteopenia 02/02/2015  . Health care maintenance 08/06/2014  . Stress 02/16/2014  . Hypertension 05/03/2012  . Hypercholesterolemia 05/03/2012  . Hyperglycemia 05/03/2012  . Environmental allergies 05/03/2012   Willa Rough DPT, ATC Willa Rough 07/08/2017, 1:39 PM  [place] MAIN [facility] SERVICES [address] [place], [place], [address] Phone: [phone]   Fax:  [phone]  Name: Donna Lara MRN: [id number] Date of Birth: 11-29-1940

## 2017-07-08 NOTE — Patient Instructions (Signed)
    When you lift something heavy, push or pull, stand from sitting or get up from a squat think about breathing out to release the pressure out of the top of the can rather than let it push down on the bottom. This will help decrease the amount of prolapse.   Pelvic Tilt With Pelvic Floor (Hook-Lying)        Lie with hips and knees bent, breathe in deeply fillint=g the tumy with air.Marland Kitchen Squeeze pelvic floor and flatten low back while breathing out so that pelvis tilts. Repeat _10x2__ times. Do _2__ times a day.  Put a pillow under your hips in this position when doing this exercise to help decrease pressure in the pelvis when it feels "heavy".

## 2017-07-09 ENCOUNTER — Ambulatory Visit (INDEPENDENT_AMBULATORY_CARE_PROVIDER_SITE_OTHER): Payer: Medicare Other | Admitting: Obstetrics & Gynecology

## 2017-07-09 ENCOUNTER — Encounter: Payer: Self-pay | Admitting: Obstetrics & Gynecology

## 2017-07-09 VITALS — BP 128/80 | Ht 62.0 in | Wt 136.0 lb

## 2017-07-09 DIAGNOSIS — N814 Uterovaginal prolapse, unspecified: Secondary | ICD-10-CM

## 2017-07-09 DIAGNOSIS — N8111 Cystocele, midline: Secondary | ICD-10-CM

## 2017-07-09 NOTE — Patient Instructions (Signed)
Anterior and Posterior Colporrhaphy Anterior or posterior colporrhaphy is surgery to fix a prolapse of organs in the genital tract. Prolapse means the falling down, bulging, dropping, or drooping of an organ. Organs that commonly prolapse include the rectum, bladder, vagina, and uterus. Prolapse can affect a single organ or several organs at the same time. This often worsens when women stop having their monthly periods (menopause) because estrogen loss weakens the muscles and tissues in the genital tract. In addition, prolapse happens when the organs are damaged or weakened. This commonly happens after childbirth and as a result of aging. Surgery is often done for severe prolapses. The type of colporrhaphy done depends on the type of genital prolapse. Types of genital prolapse include the following:  Cystocele. This is a prolapse of the upper (anterior) wall of the vagina. The anterior wall bulges into the vagina and brings the bladder with it.  Rectocele. This is a prolapse of the lower (posterior) wall of the vagina. The posterior vaginal wall bulges into the vagina and brings the rectum with it.  Enterocele. This is a prolapse of part of the pelvic organs called the pouch of Douglas. It also involves a portion of the small bowel. It appears as a bulge under the neck of the uterus at the top of the back wall of the vagina.  Procidentia. This is a complete prolapse of the uterus and the cervix. The prolapse can be seen and felt coming out of the vagina.  LET [facility] CARE PROVIDER KNOW ABOUT:  Any allergies you have.  All medicines you are taking, including vitamins, herbs, eye drops, creams, and over-the-counter medicines.  Previous problems you or members of your family have had with the use of anesthetics.  Any blood disorders you have.  Previous surgeries you have had.  Medical conditions you have.  Smoking history or history of alcohol use.  Possibility of pregnancy, if this  applies. RISKS AND COMPLICATIONS Generally, anterior or posterior colporrhaphy is a safe procedure. However, as with any procedure, complications can occur. Possible complications include:  Infection.  Damage to other organs during surgery.  Bleeding after surgery.  Problems urinating.  Problems from the anesthetic.  BEFORE THE PROCEDURE  Ask your health care provider about changing or stopping your regular medicines.  Do not eat or drink anything for at least 8 hours before the surgery.  If you smoke, do not smoke for at least 2 weeks before the surgery.  Make plans to have someone drive you home after your hospital stay. Also, arrange for someone to help you with activities during recovery. PROCEDURE You may be given medicine to help you relax before the surgery (sedative). During the surgery you will be given medicine to make you sleep through the procedure (general anesthetic) or medicine to numb you from the waist down (spinal anesthetic). This medicine will be given through an intravenous (IV) access tube that is put into one of your veins. The procedure will vary depending on the type of repair:  Anterior repair. A cut (incision) is made in the midline section of the front part of the vaginal wall. A triangular-shaped piece of vaginal tissue is removed, and the stronger, healthier tissue is sewn together in order to support and suspend the bladder.  Posterior repair. An incision is made midline on the back wall of the vagina. A triangular portion of vaginal skin is removed to expose the muscle. Excess tissue is removed, and stronger, healthier muscle and ligament tissue is  sewn together to support the rectum.  Anterior and posterior repair. Both procedures are done during the same surgery.  What to expect after the procedure You will be taken to a recovery area. Your blood pressure, pulse, breathing, and temperature (vital signs) will be monitored. This is done until you are  stable. Then you will be transferred to a hospital room. After surgery, you will have a small rubber tube in place to drain your bladder (urinary catheter). This will be in place for 2 to 7 days or until your bladder is working properly on its own. The IV access tube will be removed in 1 to 3 days. You may have a gauze packing in your vagina to prevent bleeding. This will be removed 2 or 3 days after the surgery. You will likely need to stay in the hospital for 3 to 5 days.  Vaginal Hysterectomy A vaginal hysterectomy is a procedure to remove all or part of the uterus through a small incision in the vagina. In this procedure, your health care provider may remove your entire uterus, including the lower end (cervix). You may need a vaginal hysterectomy to treat:  Uterine fibroids.  A condition that causes the lining of the uterus to grow in other areas (endometriosis).  Problems with pelvic support.  Cancer of the cervix, ovaries, uterus, or tissue that lines the uterus (endometrium).  Excessive (dysfunctional) uterine bleeding.  When removing your uterus, your health care provider may also remove the organs that produce eggs (ovaries) and the tubes that carry eggs to your uterus (fallopian tubes). After a vaginal hysterectomy, you will no longer be able to have a baby. You will also no longer get your menstrual period. Tell a health care provider about:  Any allergies you have.  All medicines you are taking, including vitamins, herbs, eye drops, creams, and over-the-counter medicines.  Any problems you or family members have had with anesthetic medicines.  Any blood disorders you have.  Any surgeries you have had.  Any medical conditions you have.  Whether you are pregnant or may be pregnant. What are the risks? Generally, this is a safe procedure. However, problems may occur, including:  Bleeding.  Infection.  A blood clot that forms in your leg and travels to your lungs  (pulmonary embolism).  Damage to surrounding organs.  Pain during sex.  What happens before the procedure?  Ask your health care provider what organs will be removed during surgery.  Ask your health care provider about: ? Changing or stopping your regular medicines. This is especially important if you are taking diabetes medicines or blood thinners. ? Taking medicines such as aspirin and ibuprofen. These medicines can thin your blood. Do not take these medicines before your procedure if your health care provider instructs you not to.  Follow instructions from your health care provider about eating or drinking restrictions.  Do not use any tobacco products, such as cigarettes, chewing tobacco, and e-cigarettes. If you need help quitting, ask your health care provider.  Plan to have someone take you home after discharge from the hospital. What happens during the procedure?  To reduce your risk of infection: ? Your health care team will wash or sanitize their hands. ? Your skin will be washed with soap.  An IV tube will be inserted into one of your veins.  You may be given antibiotic medicine to help prevent infection.  You will be given one or more of the following: ? A medicine to help  you relax (sedative). ? A medicine to numb the area (local anesthetic). ? A medicine to make you fall asleep (general anesthetic). ? A medicine that is injected into an area of your body to numb everything beyond the injection site (regional anesthetic).  Your surgeon will make an incision in your vagina.  Your surgeon will locate and remove all or part of your uterus.  Your ovaries and fallopian tubes may be removed at the same time.  The incision will be closed with stitches (sutures) that dissolve over time. The procedure may vary among health care providers and hospitals. What happens after the procedure?  Your blood pressure, heart rate, breathing rate, and blood oxygen level will be  monitored often until the medicines you were given have worn off.  You will be encouraged to get up and walk around after a few hours to help prevent complications.  You may have IV tubes in place for a few days.  You will be given pain medicine as needed.  Do not drive for 24 hours if you were given a sedative. This information is not intended to replace advice given to you by your health care provider. Make sure you discuss any questions you have with your health care provider. Document Released: 07/16/2015 Document Revised: 08/30/2015 Document Reviewed: 04/08/2015 Elsevier Interactive Patient Education  [employer].

## 2017-07-09 NOTE — Progress Notes (Signed)
Cystocele/Rectocele Patient complains of a cystocele. Problem started a few months ago. Symptoms include: prolapse of tissue with straining and discomfort: mild. Symptoms have waxed and waned.  Pt denies urinary sx's of urge, freq, nocturia, or incontinence.  Pt feels bulge vaginally, esp after activity or straining.  Recent PT, to try to help.  Recent vag est for atrophy tx.   PMHx: She  has a past medical history of Allergy, Carpal tunnel syndrome (1999), GERD (gastroesophageal reflux disease), Hypercholesterolemia, and Hypertension. Also,  has a past surgical history that includes Appendectomy (1963); Tonsillectomy; Eye surgery; Carpal tunnel release; Colonoscopy; Colonoscopy with propofol (N/A, 12/01/2016); and Polypectomy (12/01/2016)., family history includes Asthma in her daughter; Breast cancer in her other; Diabetes in her maternal grandmother and mother; Hypertension in her brother.,  reports that she has never smoked. She has never used smokeless tobacco. She reports that she drinks alcohol. She reports that she does not use drugs.  She has a current medication list which includes the following prescription(s): aloe vera, benzonatate, vitamin d3, clonazepam, co-enzyme q-10, conjugated estrogens, cyanocobalamin, estradiol, fluconazole, fluticasone, hydrochlorothiazide, magnesium, magnesium citrate, mometasone, omega-3 acid ethyl esters, fish oil, oseltamivir, red yeast rice extract, and vitamin c. Also, is allergic to codeine sulfate.  Review of Systems  Constitutional: Negative for chills, fever and malaise/fatigue.  HENT: Negative for congestion, sinus pain and sore throat.   Eyes: Negative for blurred vision and pain.  Respiratory: Negative for cough and wheezing.   Cardiovascular: Negative for chest pain and leg swelling.  Gastrointestinal: Negative for abdominal pain, constipation, diarrhea, heartburn, nausea and vomiting.  Genitourinary: Negative for dysuria, frequency, hematuria and  urgency.  Musculoskeletal: Negative for back pain, joint pain, myalgias and neck pain.  Skin: Negative for itching and rash.  Neurological: Negative for dizziness, tremors and weakness.  Endo/Heme/Allergies: Does not bruise/bleed easily.  Psychiatric/Behavioral: Negative for depression. The patient is not nervous/anxious and does not have insomnia.    Objective: BP 128/80   Ht 5\' 2"  (1.575 m)   Wt 136 lb (61.7 kg)   LMP 04/28/1992   BMI 24.87 kg/m  Physical Exam  Constitutional: She is oriented to person, place, and time. She appears well-developed and well-nourished. No distress.  Genitourinary: Rectum normal, vagina normal and uterus normal. Pelvic exam was performed with patient supine. There is no rash or lesion on the right labia. There is no rash or lesion on the left labia. Vagina exhibits no lesion. No bleeding in the vagina. Right adnexum does not display mass and does not display tenderness. Left adnexum does not display mass and does not display tenderness. Cervix does not exhibit motion tenderness, lesion, friability or polyp.   Uterus is mobile and midaxial. Uterus is not enlarged or exhibiting a mass.  HENT:  Head: Normocephalic and atraumatic. Head is without laceration.  Right Ear: Hearing normal.  Left Ear: Hearing normal.  Nose: No epistaxis.  No foreign bodies.  Mouth/Throat: Uvula is midline, oropharynx is clear and moist and mucous membranes are normal.  Eyes: Pupils are equal, round, and reactive to light.  Neck: Normal range of motion. Neck supple. No thyromegaly present.  Cardiovascular: Normal rate and regular rhythm. Exam reveals no gallop and no friction rub.  No murmur heard. Pulmonary/Chest: Effort normal and breath sounds normal. No respiratory distress. She has no wheezes. Right breast exhibits no mass, no skin change and no tenderness. Left breast exhibits no mass, no skin change and no tenderness.  Abdominal: Soft. Bowel sounds are normal. She exhibits  no distension. There is no tenderness. There is no rebound.  Musculoskeletal: Normal range of motion.  Neurological: She is alert and oriented to person, place, and time. No cranial nerve deficit.  Skin: Skin is warm and dry.  Psychiatric: She has a normal mood and affect. Judgment normal.  Vitals reviewed.  ASSESSMENT/PLAN:   Problem List Items Addressed This Visit      Genitourinary   Cystocele, midline - Primary   Uterine prolapse    Options for management discussed, symptomatic and quality of life considerations. Pessary, vs TVH AR, vs Exp Mgt all discussed as options.  Pros and cons of each.  Info provided to patient.  To consider. Chronic lifting and its affect on her condition and how it would affect her treatment options going forward also discussed.    Barnett Applebaum, MD, [facility], [facility] 07/09/2017  10:25 AM

## 2017-07-13 ENCOUNTER — Ambulatory Visit: Payer: Medicare Other

## 2017-07-13 DIAGNOSIS — N814 Uterovaginal prolapse, unspecified: Secondary | ICD-10-CM

## 2017-07-13 DIAGNOSIS — R293 Abnormal posture: Secondary | ICD-10-CM | POA: Diagnosis not present

## 2017-07-13 DIAGNOSIS — M6281 Muscle weakness (generalized): Secondary | ICD-10-CM | POA: Diagnosis not present

## 2017-07-13 NOTE — Patient Instructions (Signed)
Kegel exercises:    With neutral spine, tighten pelvic floor by imagining you are stopping the flow of urine, squeezing only around the vagina and anus.  Quick-Flicks: Pull up and in quickly and then relax allowing just enough time for the muscles to full lengthen before the next contraction. Do _5__ repetitions in a row, stopping if the pelvic floor muscle gets tired and other muscles try to take over.  Long-Holds: Hold for _5__ seconds and then fully release, repeat _5__ times.   Repeat both of these exercises _5__ times throughout the day     Getting In/out of bed     Lying on back, bend left knee and place left arm across chest. Roll all in one movement to the right. Reverse to roll to the left. Always move as one unit.     Once you are lying on you side, move legs to edge of bed. Pull in the pelvic floor and lower tummy and push down with both hands while moving legs off bed to reach sitting position.  *Reverse sequence to return to lying down.  Copyright  VHI. All rights reserved.     Copyright  VHI. All rights reserved.    Stabilization: Sit to Stand Transfer, Pelvic Floor Contraction    Sit, feet flat, contract pelvic floor as if stopping urination. Breate in as you hinge forward at hips, breathe out while you stand, gently squeezing the pelvic floor muscles (kegel). Do this every time you get up! If you forget, sit down and do it again  Copyright  VHI. All rights reserved.

## 2017-07-13 NOTE — Therapy (Signed)
[place] MAIN [facility] SERVICES [address] [place], [place], [address] Phone: [phone]   Fax:  [phone]  Physical Therapy Treatment  Patient Details  Name: Donna Lara MRN: [id number] Date of Birth: February 12, 1941 Referring Provider: Zara Council   Encounter Date: 07/13/2017  PT End of Session - 07/13/17 2133    Visit Number  2    Number of Visits  10    Date for PT Re-Evaluation  09/16/17    Authorization Type  Medicare    Authorization - Visit Number  2    Authorization - Number of Visits  10    PT Start Time  0930    PT Stop Time  1030    PT Time Calculation (min)  60 min    Activity Tolerance  Patient tolerated treatment well    Behavior During Therapy  [facility] for tasks assessed/performed       Past Medical History:  Diagnosis Date  . Allergy   . Carpal tunnel syndrome 1999  . GERD (gastroesophageal reflux disease)   . Hypercholesterolemia    By last lipid panel  . Hypertension    Borderline. She does not take the 12.5 HCTZ daily    Past Surgical History:  Procedure Laterality Date  . APPENDECTOMY  1963  . CARPAL TUNNEL RELEASE     right hand  . COLONOSCOPY    . COLONOSCOPY WITH PROPOFOL N/A 12/01/2016   Procedure: COLONOSCOPY WITH PROPOFOL;  Surgeon: Lucilla Lame, MD;  Location: [facility];  Service: Gastroenterology;  Laterality: N/A;  . EYE SURGERY     left  . POLYPECTOMY  12/01/2016   Procedure: POLYPECTOMY INTESTINAL;  Surgeon: Lucilla Lame, MD;  Location: [facility];  Service: Gastroenterology;;  Transverse colon polyp x 2 Ascending colon polyp x 3  . TONSILLECTOMY      There were no vitals filed for this visit.    Pelvic Floor Physical Therapy Treatment Note  SCREENING  Changes in medications, allergies, or medical history?:no     SUBJECTIVE  Patient reports: She went and discussed pessaries and potential for hysterectomy with MD recently. She does not want to have surgery unless she  absolutely has to and feels that she can tell the difference already between when she was not doing anything and the exercises she has been doing.  Pain update: Pt. Made no c/o pain.  Patient Goals: Decrease prolapse feeling.   OBJECTIVE  Changes in: Posture/Observations:  Forward head and shoulders, decreased TA activation with movement.    Strength/MMT:  LE MMT:  Pelvic floor: Able to perform coordinated kegel with cueing, greater difficulty performing coordinated kegel with pelvic tilt in supine. 5 second hold endurance.  Abdominal:  TTP through Psoas B, ability to contract TA with cue for exhale and flattening back, progressed from ~ 50% accuracy to 75% accuracy of coordination throughout session.   INTERVENTIONS THIS SESSION: Manual: TP release to Psoas B to improve ability to recruit TA and PFM. NM re-ed: Worked on coordination of muscles acting on the pelvis with pelvic tilts, those within the pelvis with kegels, and putting the two together with tilts and kegels combined. Pt. Required mod verbal and tactile cueing to attain coordination and would benefit from review. Educated on performing kegels at home to improve recruitment and coordination of the PFM. Theract: Educated on appropriate body mechanics for supine to sit and sit-to stand with practice to decrease downward pressure on the pelvic organs.    Total  time: 60 min.                          PT Education - 07/13/17 2132    Education provided  Yes    Education Details  See Pt. Instructions and Interventions this session.    Person(s) Educated  Patient    Methods  Explanation;Demonstration;Tactile cues;Verbal cues;Handout    Comprehension  Verbalized understanding;Returned demonstration;Verbal cues required;Tactile cues required;Need further instruction       PT Short Term Goals - 07/08/17 1330      PT SHORT TERM GOAL #1   Title  Patient will demonstrate functional recruitment of TA  with breathing, sit-to-stand, squatting/lifting, and walking to allow for improved pelvic brace coordination, improved balance, and decreased downward pressure on the pelvic organs.    Time  5    Period  Weeks    Status  New    Target Date  08/12/17      PT SHORT TERM GOAL #2   Title  Patient will demonstrate improved sitting and standing posture to demonstrate learning and decrease stress on the pelvic floor with functional activity.    Time  5    Status  New    Target Date  08/12/17        PT Long Term Goals - 07/08/17 1331      PT LONG TERM GOAL #1   Title  Patient will report no episodes of urge incontinence over the course of the prior two weeks to demonstrate improved functional ability.    Time  10    Period  Weeks    Status  New    Target Date  09/16/17      PT LONG TERM GOAL #2   Title  Patient will describe feeling of pressure no more than 20% of the time over the course of the past week to demonstrate improved recruitment and strength of the pelvic floor.    Baseline  80%    Time  10    Period  Weeks    Status  New    Target Date  09/16/17      PT LONG TERM GOAL #3   Title  Patient will score at or below 11/300 on the PFDI and 0/300 on the PFIQ to demonstrate a clinically meaningful decrease in disability and distress due to pelvic floor dysfunction.    Baseline  PFDI: 56/300, PFDI: 29/300    Time  10    Period  Weeks    Status  New    Target Date  09/16/17            Plan - 07/13/17 2135    Clinical Impression Statement  Pt. responded well to all interventions today, demonstrating improved coordination of the PFM and muscles acting on the pelvis by end of session. Continue per POC.    Clinical Presentation  Stable    Clinical Decision Making  Moderate    Rehab Potential  Good    PT Frequency  1x / week    PT Duration  Other (comment)    PT Treatment/Interventions  ADLs/Self Care Home Management;Biofeedback;Electrical Stimulation;Functional mobility  training;Therapeutic activities;Therapeutic exercise;Patient/family education;Neuromuscular re-education;Manual techniques;Scar mobilization;Taping    PT Next Visit Plan  assess spine and hips, Add TA and educate on standing and seated posture as well as AMB.    PT Home Exercise Plan  hook-lying pelvic tilts with kegel, soda-can, squatty potty, kegels, Log-roll, sit-to-stand.  Consulted and Agree with Plan of Care  Patient       Patient will benefit from skilled therapeutic intervention in order to improve the following deficits and impairments:  Increased fascial restricitons, Improper body mechanics, Pain, Decreased coordination, Decreased scar mobility, Increased muscle spasms, Postural dysfunction, Decreased endurance, Decreased strength  Visit Diagnosis: Muscle weakness (generalized)  Abnormal posture  Cystocele with prolapse     Problem List Patient Active Problem List   Diagnosis Date Noted  . Cystocele, midline 07/09/2017  . Uterine prolapse 07/09/2017  . Influenza 06/24/2017  . Special screening for malignant neoplasms, colon   . Benign neoplasm of ascending colon   . Benign neoplasm of transverse colon   . Cough 08/20/2015  . Viral respiratory infection 08/09/2015  . 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 Osteopenia 02/02/2015  . Health care maintenance 08/06/2014  . Stress 02/16/2014  . Hypertension 05/03/2012  . Hypercholesterolemia 05/03/2012  . Hyperglycemia 05/03/2012  . Environmental allergies 05/03/2012   Willa Rough DPT, ATC Willa Rough 07/13/2017, 9:38 PM  [place] MAIN [facility] SERVICES [address] [place], [place], [address] Phone: [phone]   Fax:  [phone]  Name: Donna Lara MRN: [id number] Date of Birth: 1940-05-16

## 2017-07-20 ENCOUNTER — Ambulatory Visit (INDEPENDENT_AMBULATORY_CARE_PROVIDER_SITE_OTHER): Payer: Medicare Other | Admitting: Internal Medicine

## 2017-07-20 ENCOUNTER — Encounter: Payer: Self-pay | Admitting: Internal Medicine

## 2017-07-20 DIAGNOSIS — R739 Hyperglycemia, unspecified: Secondary | ICD-10-CM

## 2017-07-20 DIAGNOSIS — I10 Essential (primary) hypertension: Secondary | ICD-10-CM | POA: Diagnosis not present

## 2017-07-20 DIAGNOSIS — N8111 Cystocele, midline: Secondary | ICD-10-CM | POA: Diagnosis not present

## 2017-07-20 DIAGNOSIS — Z0000 Encounter for general adult medical examination without abnormal findings: Secondary | ICD-10-CM | POA: Diagnosis not present

## 2017-07-20 DIAGNOSIS — E7800 Pure hypercholesterolemia, unspecified: Secondary | ICD-10-CM | POA: Diagnosis not present

## 2017-07-20 DIAGNOSIS — F439 Reaction to severe stress, unspecified: Secondary | ICD-10-CM

## 2017-07-20 NOTE — Assessment & Plan Note (Signed)
Physical today 07/20/17.  Colonoscopy 02/17/06.  Mammogram 06/09/17 - Birads I.

## 2017-07-20 NOTE — Progress Notes (Signed)
[name], female   DOB: [birth year], 77 y.o.   MRN: [id number]   Subjective:    [name], female    DOB: 04-06-1941, 77 y.o.   MRN: [id number]  HPI  Patient with past history of hypercholesterolemia and hypertension.  She comes in today to follow up on these issues as well as for a complete physical exam.  She reports she is doing relatively well.  Trying to stay active.  No checking her sugars regularly.  Elevated here.  No chest pain.  No sob.  No acid reflux.  No abdominal pain.  Bowels moving.  Saw Dr Kenton Kingfisher - for cystocele.  States discussed surgery.  She desires not to proceed with surgery.  Saw Shannnon McGowan.  Advised to use premarin cream.  Therapy is helping.  Had recent colonoscopy 11/2016.  Recommended f/u in 5 years.  Handling stress relatively well.     Past Medical History:  Diagnosis Date  . Allergy   . Carpal tunnel syndrome 1999  . GERD (gastroesophageal reflux disease)   . Hypercholesterolemia    By last lipid panel  . Hypertension    Borderline. She does not take the 12.5 HCTZ daily   Past Surgical History:  Procedure Laterality Date  . APPENDECTOMY  1963  . CARPAL TUNNEL RELEASE     right hand  . COLONOSCOPY    . COLONOSCOPY WITH PROPOFOL N/A 12/01/2016   Procedure: COLONOSCOPY WITH PROPOFOL;  Surgeon: Lucilla Lame, MD;  Location: [place];  Service: Gastroenterology;  Laterality: N/A;  . EYE SURGERY     left  . POLYPECTOMY  12/01/2016   Procedure: POLYPECTOMY INTESTINAL;  Surgeon: Lucilla Lame, MD;  Location: [place];  Service: Gastroenterology;;  Transverse colon polyp x 2 Ascending colon polyp x 3  . TONSILLECTOMY     Family History  Problem Relation Age of Onset  . Diabetes Mother   . Hypertension Brother   . Diabetes Maternal Grandmother   . Asthma Daughter   . Breast cancer Other   . Colon cancer Neg Hx    Social History   Socioeconomic History  . Marital status: Married    Spouse name: Not on file   . Number of children: 3  . Years of education: Not on file  . Highest education level: Not on file  Occupational History    Employer: OTHER  Social Needs  . Financial resource strain: Not on file  . Food insecurity:    Worry: Not on file    Inability: Not on file  . Transportation needs:    Medical: Not on file    Non-medical: Not on file  Tobacco Use  . Smoking status: Never Smoker  . Smokeless tobacco: Never Used  Substance and Sexual Activity  . Alcohol use: Yes    Alcohol/week: 0.0 oz    Comment: 2-3 drinks/month  . Drug use: No  . Sexual activity: Never  Lifestyle  . Physical activity:    Days per week: Not on file    Minutes per session: Not on file  . Stress: Not on file  Relationships  . Social connections:    Talks on phone: Not on file    Gets together: Not on file    Attends religious service: Not on file    Active member of club or organization: Not on file    Attends meetings of clubs or organizations: Not on file    Relationship status: Not on  file  Other Topics Concern  . Not on file  Social History Narrative   Recently widowed. Her husband died of a heart attack in 05/14/15    Outpatient Encounter Medications as of 07/20/2017  Medication Sig  . ALOE VERA PO Take 1 tablet by mouth daily.   . Cholecalciferol (VITAMIN D3) 1000 UNITS CAPS Take 1,000 Units by mouth 2 (two) times daily.   . clonazePAM (KLONOPIN) 0.5 MG tablet TAKE 1 TABLET DAILY. (Patient taking differently: As needed)  . co-enzyme Q-10 30 MG capsule Take 30 mg by mouth 2 (two) times daily.  Marland Kitchen conjugated estrogens (PREMARIN) vaginal cream Place 1 Applicatorful vaginally daily. Apply 0.66m (pea-sized amount)  just inside the vaginal introitus with a finger-tip on  Monday, Wednesday and Friday nights.  . CYANOCOBALAMIN PO Take 1 tablet by mouth daily.   .Marland Kitchenestradiol (ESTRACE VAGINAL) 0.1 MG/GM vaginal cream Apply 0.513m(pea-sized amount)  just inside the vaginal introitus with a finger-tip  on Monday, Wednesday and Friday nights.  . fluconazole (DIFLUCAN) 100 MG tablet Take 100 mg by mouth once.  . fluticasone (FLONASE) 50 MCG/ACT nasal spray Place 2 sprays into both nostrils daily.  . hydrochlorothiazide (HYDRODIURIL) 12.5 MG tablet Take 12.5 mg by mouth as needed (as needed for edema and hypertension).   . magnesium 30 MG tablet Take 30 mg by mouth 1 day or 1 dose.  . Marland KitchenAGNESIUM CITRATE PO Take 1 tablet by mouth daily.   . mometasone (ELOCON) 0.1 % lotion Apply topically daily. For no more than 7 days.  . Marland Kitchenmega-3 acid ethyl esters (LOVAZA) 1 g capsule Take by mouth 3 (three) times daily.  . Omega-3 Fatty Acids (FISH OIL) 1000 MG CAPS Take 1,000 mg by mouth 2 (two) times daily.   . Red Yeast Rice Extract (RED YEAST RICE PO) Take 2 capsules by mouth daily.  . vitamin C (ASCORBIC ACID) 500 MG tablet Take 500 mg by mouth 2 (two) times daily.  . [DISCONTINUED] benzonatate (TESSALON) 100 MG capsule Take 1 capsule (100 mg total) by mouth 3 (three) times daily as needed for cough. (Patient not taking: Reported on 07/20/2017)  . [DISCONTINUED] oseltamivir (TAMIFLU) 75 MG capsule Take 1 capsule (75 mg total) by mouth 2 (two) times daily. (Patient not taking: Reported on 07/20/2017)   No facility-administered encounter medications on file as of 07/20/2017.     Review of Systems  Constitutional: Negative for appetite change and unexpected weight change.  HENT: Negative for congestion and sinus pressure.   Eyes: Negative for pain and visual disturbance.  Respiratory: Negative for cough, chest tightness and shortness of breath.   Cardiovascular: Negative for chest pain, palpitations and leg swelling.  Gastrointestinal: Negative for abdominal pain, diarrhea, nausea and vomiting.  Genitourinary: Negative for difficulty urinating and dysuria.  Musculoskeletal: Negative for joint swelling and myalgias.  Skin: Negative for color change and rash.  Neurological: Negative for dizziness,  light-headedness and headaches.  Hematological: Negative for adenopathy. Does not bruise/bleed easily.  Psychiatric/Behavioral: Negative for agitation and dysphoric mood.       Objective:    Physical Exam  Constitutional: She is oriented to person, place, and time. She appears well-developed and well-nourished. No distress.  HENT:  Nose: Nose normal.  Mouth/Throat: Oropharynx is clear and moist.  Eyes: Right eye exhibits no discharge. Left eye exhibits no discharge. No scleral icterus.  Neck: Neck supple. No thyromegaly present.  Cardiovascular: Normal rate and regular rhythm.  Pulmonary/Chest: Breath sounds normal. No accessory muscle  usage. No tachypnea. No respiratory distress. She has no decreased breath sounds. She has no wheezes. She has no rhonchi. Right breast exhibits no inverted nipple, no mass, no nipple discharge and no tenderness (no axillary adenopathy). Left breast exhibits no inverted nipple, no mass, no nipple discharge and no tenderness (no axilarry adenopathy).  Abdominal: Soft. Bowel sounds are normal. There is no tenderness.  Musculoskeletal: She exhibits no edema or tenderness.  Lymphadenopathy:    She has no cervical adenopathy.  Neurological: She is alert and oriented to person, place, and time.  Skin: Skin is warm. No rash noted. No erythema.  Psychiatric: She has a normal mood and affect. Her behavior is normal.    BP (!) 146/90 (BP Location: Left Arm, Patient Position: Sitting, Cuff Size: Normal)   Pulse 67   Temp (!) 97.4 F (36.3 C) (Oral)   Resp 16   Ht 5' 2"  (1.575 m)   Wt 136 lb (61.7 kg)   LMP 04/28/1992   SpO2 98%   BMI 24.87 kg/m  Wt Readings from Last 3 Encounters:  07/20/17 136 lb (61.7 kg)  07/09/17 136 lb (61.7 kg)  06/24/17 140 lb (63.5 kg)     Lab Results  Component Value Date   WBC 6.8 05/05/2017   HGB 14.7 05/05/2017   HCT 44.1 05/05/2017   PLT 235.0 05/05/2017   GLUCOSE 101 (H) 05/05/2017   CHOL 201 (H) 05/05/2017   TRIG  78.0 05/05/2017   HDL 61.20 05/05/2017   LDLCALC 125 (H) 05/05/2017   ALT 15 05/05/2017   AST 21 05/05/2017   NA 139 05/05/2017   K 4.7 05/05/2017   CL 105 05/05/2017   CREATININE 0.84 05/05/2017   BUN 17 05/05/2017   CO2 28 05/05/2017   TSH 1.63 05/05/2017   HGBA1C 5.4 05/05/2017    Mm Screening Breast Tomo Bilateral  Result Date: 06/09/2017 CLINICAL DATA:  Screening. EXAM: DIGITAL SCREENING BILATERAL MAMMOGRAM WITH TOMO AND CAD COMPARISON:  Previous exam(s). ACR Breast Density Category b: There are scattered areas of fibroglandular density. FINDINGS: There are no findings suspicious for malignancy. Images were processed with CAD. IMPRESSION: No mammographic evidence of malignancy. A result letter of this screening mammogram will be mailed directly to the patient. RECOMMENDATION: Screening mammogram in one year. (Code:SM-B-01Y) BI-RADS CATEGORY  1: Negative. Electronically Signed   By: Everlean Alstrom M.D.   On: 06/09/2017 13:35       Assessment & Plan:   Problem List Items Addressed This Visit    Cystocele, midline    Saw Dr Kenton Kingfisher.  Desires not to pursue surgery.  Exercises helping.  Follow.       Health care maintenance    Physical today 07/20/17.  Colonoscopy 02/17/06.  Mammogram 06/09/17 - [facility] I.        Hypercholesterolemia    Off crestor.  Low cholesterol diet and exercise.  Follow lipid panel and liver function tests.        Hyperglycemia    Low carb diet and exercise.  Follow met b and a1c.        Hypertension    Blood pressure on recheck improved some.  Have her spot check her pressure.  Follow.  Get her back in soon to reassess.        Stress    Overall she feels she is handling things relatively well.  Follow.            [name], MD

## 2017-07-22 ENCOUNTER — Ambulatory Visit: Payer: Medicare Other

## 2017-07-22 DIAGNOSIS — M6281 Muscle weakness (generalized): Secondary | ICD-10-CM

## 2017-07-22 DIAGNOSIS — N814 Uterovaginal prolapse, unspecified: Secondary | ICD-10-CM

## 2017-07-22 DIAGNOSIS — R293 Abnormal posture: Secondary | ICD-10-CM

## 2017-07-22 NOTE — Therapy (Signed)
[place] MAIN [facility] SERVICES [address] [place], [place], [address] Phone: [phone]   Fax:  [phone]  Physical Therapy Treatment  Patient Details  Name: Donna Lara MRN: [id number] Date of Birth: October 19, 1940 Referring Provider: Zara Council   Encounter Date: 07/22/2017  PT End of Session - 07/22/17 1621    Visit Number  3    Number of Visits  10    Date for PT Re-Evaluation  09/16/17    Authorization Type  Medicare    Authorization - Visit Number  3    Authorization - Number of Visits  10    PT Start Time  1005    PT Stop Time  1108    PT Time Calculation (min)  63 min    Activity Tolerance  Patient tolerated treatment well    Behavior During Therapy  [facility] for tasks assessed/performed       Past Medical History:  Diagnosis Date  . Allergy   . Carpal tunnel syndrome 1999  . GERD (gastroesophageal reflux disease)   . Hypercholesterolemia    By last lipid panel  . Hypertension    Borderline. She does not take the 12.5 HCTZ daily    Past Surgical History:  Procedure Laterality Date  . APPENDECTOMY  1963  . CARPAL TUNNEL RELEASE     right hand  . COLONOSCOPY    . COLONOSCOPY WITH PROPOFOL N/A 12/01/2016   Procedure: COLONOSCOPY WITH PROPOFOL;  Surgeon: Lucilla Lame, MD;  Location: [place];  Service: Gastroenterology;  Laterality: N/A;  . EYE SURGERY     left  . POLYPECTOMY  12/01/2016   Procedure: POLYPECTOMY INTESTINAL;  Surgeon: Lucilla Lame, MD;  Location: [place];  Service: Gastroenterology;;  Transverse colon polyp x 2 Ascending colon polyp x 3  . TONSILLECTOMY      There were no vitals filed for this visit.   Pelvic Floor Physical Therapy Treatment Note  SCREENING  Changes in medications, allergies, or medical history?: no   SUBJECTIVE  Patient reports: Thinks it has been helping, does not feel like it is as "Bothersome". Is starting to notice it later in the day. Was able to  weed-eat without noticing and symptoms. Has been able to use urge suppression successfully to eliminate urge incontinence. Had new pain between her shoulders a couple days ago that is not there right now.    Pain update:  Location of pain:   Location of pain: LBP Current pain: 0/10  Max pain: 4/10 Least pain: 0/10 Nature of pain:achy    Patient Goals: Decrease prolapse feeling.   OBJECTIVE  Changes in: Posture/Observations:  Scapular dyskenesia B, R>L with audible POP with overhead reach, hyper kyphotic  Leg-length: L 79.5, R 80.5 in supine  R anterior inominate rotation.  Range of Motion/Flexibilty:  R rotation limited ~ 25% R IR: neutral, ER 85 L IR: 3, ER: 70 R ABD: 45 deg L ABD: 50 deg.  B ABD and ADD cause LBP  Strength/MMT:  LE MMT Left Right  Hip flex: (L2) /5 /5  Hip ext: 4+/5 4+/5  Hip abd: 4/5 4+/5  Hip add: /5 4+/5  Hip IR 4/5 4/5  Hip ER 5/5 4+/5   ABD and ER caused pain up to low back through hip  Abdominal:  Poor coordination of exercise given at last visit, improved following review. Pt. Instructed to use towel for feedback at home.   Palpation: TT PA at lumbar and lower thoracic,  stiffness through mid and upper thoracic.  R PSIS high posteriorly, low anteriorly, R mild rib-hump.   INTERVENTIONS THIS SESSION: Manual: assessed Hip and spine ROM and leg strength and length for further POC development. Performed TP release to L psoas and R QL to prepare for MET correction at following visit.  NM re-ed: reviewed pelvic tilts in hook-lying with breathing and kegel to improve coordination and improve functional recruitment of the pelvic brace to decrease prolapse sx.  Total time: 63 min.                           PT Education - 07/22/17 1619    Education provided  Yes    Education Details  See Interventions this session.    Person(s) Educated  Patient    Methods  Explanation;Demonstration;Tactile cues;Verbal  cues;Handout    Comprehension  Verbalized understanding;Returned demonstration;Verbal cues required;Tactile cues required;Need further instruction       PT Short Term Goals - 07/08/17 1330      PT SHORT TERM GOAL #1   Title  Patient will demonstrate functional recruitment of TA with breathing, sit-to-stand, squatting/lifting, and walking to allow for improved pelvic brace coordination, improved balance, and decreased downward pressure on the pelvic organs.    Time  5    Period  Weeks    Status  New    Target Date  08/12/17      PT SHORT TERM GOAL #2   Title  Patient will demonstrate improved sitting and standing posture to demonstrate learning and decrease stress on the pelvic floor with functional activity.    Time  5    Status  New    Target Date  08/12/17        PT Long Term Goals - 07/08/17 1331      PT LONG TERM GOAL #1   Title  Patient will report no episodes of urge incontinence over the course of the prior two weeks to demonstrate improved functional ability.    Time  10    Period  Weeks    Status  New    Target Date  09/16/17      PT LONG TERM GOAL #2   Title  Patient will describe feeling of pressure no more than 20% of the time over the course of the past week to demonstrate improved recruitment and strength of the pelvic floor.    Baseline  80%    Time  10    Period  Weeks    Status  New    Target Date  09/16/17      PT LONG TERM GOAL #3   Title  Patient will score at or below 11/300 on the PFDI and 0/300 on the PFIQ to demonstrate a clinically meaningful decrease in disability and distress due to pelvic floor dysfunction.    Baseline  PFDI: 56/300, PFDI: 29/300    Time  10    Period  Weeks    Status  New    Target Date  09/16/17            Plan - 07/22/17 1621    Clinical Impression Statement  Pt. responded well to TP release, demonstrating reduction of spasms and improved recruitment of the TA following treatment. Pt required review for pelvic  tilt exercise and will benefit from further review in future sessions. Continue per POC.    Clinical Presentation  Stable    Clinical Decision Making  Moderate    Rehab Potential  Good    PT Frequency  1x / week    PT Duration  Other (comment) 10 weeks    PT Treatment/Interventions  ADLs/Self Care Home Management;Biofeedback;Electrical Stimulation;Functional mobility training;Therapeutic activities;Therapeutic exercise;Patient/family education;Neuromuscular re-education;Manual techniques;Scar mobilization;Taping    PT Next Visit Plan  perform PA to sacrum, followed by MET for R anterior rotation, review pelvic tilt in supine, Add TA and educate on standing and seated posture as well as AMB.    PT Home Exercise Plan  hook-lying pelvic tilts with kegel, soda-can, squatty potty, kegels, Log-roll, sit-to-stand.    Consulted and Agree with Plan of Care  Patient       Patient will benefit from skilled therapeutic intervention in order to improve the following deficits and impairments:  Increased fascial restricitons, Improper body mechanics, Pain, Decreased coordination, Decreased scar mobility, Increased muscle spasms, Postural dysfunction, Decreased endurance, Decreased strength  Visit Diagnosis: Muscle weakness (generalized)  Abnormal posture  Cystocele with prolapse     Problem List Patient Active Problem List   Diagnosis Date Noted  . Cystocele, midline 07/09/2017  . Uterine prolapse 07/09/2017  . Influenza 06/24/2017  . Special screening for malignant neoplasms, colon   . Benign neoplasm of ascending colon   . Benign neoplasm of transverse colon   . Cough 08/20/2015  . Viral respiratory infection 08/09/2015  . 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 Osteopenia 02/02/2015  . Health care maintenance 08/06/2014  . Stress 02/16/2014  . Hypertension 05/03/2012  . Hypercholesterolemia 05/03/2012  . Hyperglycemia 05/03/2012  . Environmental allergies 05/03/2012   Willa Rough DPT,  ATC Willa Rough 07/22/2017, 4:25 PM  [place] MAIN [facility] SERVICES [address] [place], [place], [address] Phone: [phone]   Fax:  [phone]  Name: Donna Lara MRN: [id number] Date of Birth: [birth year]

## 2017-07-23 DIAGNOSIS — R208 Other disturbances of skin sensation: Secondary | ICD-10-CM | POA: Diagnosis not present

## 2017-07-23 DIAGNOSIS — L538 Other specified erythematous conditions: Secondary | ICD-10-CM | POA: Diagnosis not present

## 2017-07-23 DIAGNOSIS — L820 Inflamed seborrheic keratosis: Secondary | ICD-10-CM | POA: Diagnosis not present

## 2017-07-25 ENCOUNTER — Encounter: Payer: Self-pay | Admitting: Internal Medicine

## 2017-07-25 NOTE — Assessment & Plan Note (Signed)
Blood pressure on recheck improved some.  Have her spot check her pressure.  Follow.  Get her back in soon to reassess.

## 2017-07-25 NOTE — Assessment & Plan Note (Signed)
Saw Dr Kenton Kingfisher.  Desires not to pursue surgery.  Exercises helping.  Follow.

## 2017-07-25 NOTE — Assessment & Plan Note (Signed)
Off crestor. Low cholesterol diet and exercise.  Follow lipid panel and liver function tests.  

## 2017-07-25 NOTE — Assessment & Plan Note (Signed)
Overall she feels she is handling things relatively well.  Follow.  

## 2017-07-25 NOTE — Assessment & Plan Note (Signed)
Low carb diet and exercise.  Follow met b and a1c.

## 2017-07-29 ENCOUNTER — Ambulatory Visit: Payer: Medicare Other

## 2017-07-29 DIAGNOSIS — R293 Abnormal posture: Secondary | ICD-10-CM | POA: Diagnosis not present

## 2017-07-29 DIAGNOSIS — N814 Uterovaginal prolapse, unspecified: Secondary | ICD-10-CM | POA: Diagnosis not present

## 2017-07-29 DIAGNOSIS — M6281 Muscle weakness (generalized): Secondary | ICD-10-CM | POA: Diagnosis not present

## 2017-07-29 NOTE — Therapy (Signed)
[place] MAIN [facility] SERVICES [address] [place], [place], [address] Phone: [phone]   Fax:  [phone]  Physical Therapy Treatment  Patient Details  Name: Donna Lara MRN: [id number] Date of Birth: 1940/10/23 Referring Provider: Zara Council   Encounter Date: 07/29/2017  PT End of Session - 07/29/17 2049    Visit Number  4    Number of Visits  10    Date for PT Re-Evaluation  09/16/17    Authorization Type  Medicare    Authorization - Visit Number  4    Authorization - Number of Visits  10    PT Start Time  1000    PT Stop Time  1100    PT Time Calculation (min)  60 min    Activity Tolerance  Patient tolerated treatment well    Behavior During Therapy  [facility] for tasks assessed/performed       Past Medical History:  Diagnosis Date  . Allergy   . Carpal tunnel syndrome 1999  . GERD (gastroesophageal reflux disease)   . Hypercholesterolemia    By last lipid panel  . Hypertension    Borderline. She does not take the 12.5 HCTZ daily    Past Surgical History:  Procedure Laterality Date  . APPENDECTOMY  1963  . CARPAL TUNNEL RELEASE     right hand  . COLONOSCOPY    . COLONOSCOPY WITH PROPOFOL N/A 12/01/2016   Procedure: COLONOSCOPY WITH PROPOFOL;  Surgeon: Lucilla Lame, MD;  Location: [place];  Service: Gastroenterology;  Laterality: N/A;  . EYE SURGERY     left  . POLYPECTOMY  12/01/2016   Procedure: POLYPECTOMY INTESTINAL;  Surgeon: Lucilla Lame, MD;  Location: [place];  Service: Gastroenterology;;  Transverse colon polyp x 2 Ascending colon polyp x 3  . TONSILLECTOMY      There were no vitals filed for this visit.    Pelvic Floor Physical Therapy Treatment Note  SCREENING  Changes in medications, allergies, or medical history?: no     SUBJECTIVE  Patient reports: She has been weeding a lot and her back is sore from that but she has been really focused on keeping her pelvic floor  engaged. Prolapse "feels better" could even sit on four-wheeler without pressure.  Pain update:  Location of pain: Low back Current pain:  0/10  Max pain:  4/10 Least pain:  0/10 Nature of pain: achy  Patient Goals: Decrease prolapse feeling.    OBJECTIVE  Changes in: Posture/Observations:  Forward head and shoulders.  Range of Motion/Flexibilty:  Very stiff and tender through sacrum and thoracic spine.   INTERVENTIONS THIS SESSION: Manual: Performed grade 3-4 PA mobs to sacrum and thoracic spine to improve mobility, decrease tightness, and improve posture to allow for better pelvic brace recruitment and strengthening. Theract: educated on body mechanics with gardening and housekeeping.  Therex: educated on and practiced TA in modified quadruped, Shoullder retractions in seated and chin-tucks in both supine and seated as well as extensions over a towel roll to improve thoracic mobility and improve posture to allow for optimal PFM function.  Total time: 60 min.                         PT Education - 07/29/17 2049    Education Details  See Pt. Instructions and Interventions this session.    Person(s) Educated  Patient    Methods  Explanation;Demonstration;Tactile cues;Handout;Verbal cues  Comprehension  Returned demonstration;Verbalized understanding;Verbal cues required;Tactile cues required;Need further instruction       PT Short Term Goals - 07/08/17 1330      PT SHORT TERM GOAL #1   Title  Patient will demonstrate functional recruitment of TA with breathing, sit-to-stand, squatting/lifting, and walking to allow for improved pelvic brace coordination, improved balance, and decreased downward pressure on the pelvic organs.    Time  5    Period  Weeks    Status  New    Target Date  08/12/17      PT SHORT TERM GOAL #2   Title  Patient will demonstrate improved sitting and standing posture to demonstrate learning and decrease stress on the  pelvic floor with functional activity.    Time  5    Status  New    Target Date  08/12/17        PT Long Term Goals - 07/08/17 1331      PT LONG TERM GOAL #1   Title  Patient will report no episodes of urge incontinence over the course of the prior two weeks to demonstrate improved functional ability.    Time  10    Period  Weeks    Status  New    Target Date  09/16/17      PT LONG TERM GOAL #2   Title  Patient will describe feeling of pressure no more than 20% of the time over the course of the past week to demonstrate improved recruitment and strength of the pelvic floor.    Baseline  80%    Time  10    Period  Weeks    Status  New    Target Date  09/16/17      PT LONG TERM GOAL #3   Title  Patient will score at or below 11/300 on the PFDI and 0/300 on the PFIQ to demonstrate a clinically meaningful decrease in disability and distress due to pelvic floor dysfunction.    Baseline  PFDI: 56/300, PFDI: 29/300    Time  10    Period  Weeks    Status  New    Target Date  09/16/17            Plan - 07/29/17 2050    Clinical Impression Statement  Pt. tolerated treatment well and demonstrated decreased pain and improved mobility following manual treatment. Pt also demonstrated understanding of all eduction provided. Contiue per POC.    Clinical Presentation  Stable    Clinical Decision Making  Moderate    Rehab Potential  Good    PT Frequency  1x / week    PT Duration  Other (comment)    PT Treatment/Interventions  ADLs/Self Care Home Management;Biofeedback;Electrical Stimulation;Functional mobility training;Therapeutic activities;Therapeutic exercise;Patient/family education;Neuromuscular re-education;Manual techniques;Scar mobilization;Taping    PT Next Visit Plan  TP to hips/abdomen as needed, MET for R anterior rotation, review pelvic tilt in supine, Educate on standing and seated posture as well as AMB.    PT Home Exercise Plan  hook-lying pelvic tilts with kegel,  soda-can, squatty potty, kegels, Log-roll, sit-to-stand.    Consulted and Agree with Plan of Care  Patient       Patient will benefit from skilled therapeutic intervention in order to improve the following deficits and impairments:  Increased fascial restricitons, Improper body mechanics, Pain, Decreased coordination, Decreased scar mobility, Increased muscle spasms, Postural dysfunction, Decreased endurance, Decreased strength  Visit Diagnosis: Muscle weakness (generalized)  Abnormal posture  Cystocele  with prolapse     Problem List Patient Active Problem List   Diagnosis Date Noted  . Cystocele, midline 07/09/2017  . Uterine prolapse 07/09/2017  . Special screening for malignant neoplasms, colon   . Benign neoplasm of ascending colon   . Benign neoplasm of transverse colon   . Cough 08/20/2015  . 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 Osteopenia 02/02/2015  . Health care maintenance 08/06/2014  . Stress 02/16/2014  . Hypertension 05/03/2012  . Hypercholesterolemia 05/03/2012  . Hyperglycemia 05/03/2012  . Environmental allergies 05/03/2012    Willa Rough 07/29/2017, 8:53 PM  [place] MAIN [facility] SERVICES [address] [place], [place], [address] Phone: [phone]   Fax:  [phone]  Name: Donna Lara MRN: [id number] Date of Birth: 1940/09/25

## 2017-07-29 NOTE — Patient Instructions (Addendum)
(  lean forward to elbows not hands)  Breathe in, let belly relax down toward the floor and then breathe out, pulling the lower belly in toward the backbone.   Repeat this 10x3 times _1 time per day  Body Mechanics handout       Place foam roller or towel under your upper back between your shoulder blades. Support your head with your hands, elbows forward, and gently rock back and forth and side to side to improve motion in your back.   Move the foam roller or towel up and down to a few spots in the upper back, repeating the process.    Shoulder Retraction and Downward Rotation   Rotate the shoulder blades back and down as if you had to hold a pencil between them, holding for 1 full second each time. Repeat this _10x3_ times _1-3_ times per day.       Start with Shoulder Retraction and Downward Rotation pictures above, holding the position while pulling the chin straight back as if trying to make a "double chin".  Breathe in forward and breathe out as you pull back, repeating this _10x2__ times __1-3__ times per day.

## 2017-08-05 ENCOUNTER — Ambulatory Visit: Payer: Medicare Other | Attending: Urology

## 2017-08-05 DIAGNOSIS — M6281 Muscle weakness (generalized): Secondary | ICD-10-CM | POA: Diagnosis not present

## 2017-08-05 DIAGNOSIS — N814 Uterovaginal prolapse, unspecified: Secondary | ICD-10-CM | POA: Diagnosis not present

## 2017-08-05 DIAGNOSIS — R293 Abnormal posture: Secondary | ICD-10-CM | POA: Diagnosis not present

## 2017-08-05 NOTE — Therapy (Signed)
[place] MAIN [facility] SERVICES [address] [place], [place], [address] Phone: [phone]   Fax:  [phone]  Physical Therapy Treatment  Patient Details  Name: Donna Lara MRN: [id number] Date of Birth: 31-Jan-1941 Referring Provider: Zara Council   Encounter Date: 08/05/2017  PT End of Session - 08/05/17 1243    Visit Number  5    Number of Visits  10    Date for PT Re-Evaluation  09/16/17    Authorization Type  Medicare    Authorization - Visit Number  5    Authorization - Number of Visits  10    PT Start Time  3846    PT Stop Time  1105    PT Time Calculation (min)  57 min    Activity Tolerance  Patient tolerated treatment well    Behavior During Therapy  [facility] for tasks assessed/performed       Past Medical History:  Diagnosis Date  . Allergy   . Carpal tunnel syndrome 1999  . GERD (gastroesophageal reflux disease)   . Hypercholesterolemia    By last lipid panel  . Hypertension    Borderline. She does not take the 12.5 HCTZ daily    Past Surgical History:  Procedure Laterality Date  . APPENDECTOMY  1963  . CARPAL TUNNEL RELEASE     right hand  . COLONOSCOPY    . COLONOSCOPY WITH PROPOFOL N/A 12/01/2016   Procedure: COLONOSCOPY WITH PROPOFOL;  Surgeon: Lucilla Lame, MD;  Location: [place];  Service: Gastroenterology;  Laterality: N/A;  . EYE SURGERY     left  . POLYPECTOMY  12/01/2016   Procedure: POLYPECTOMY INTESTINAL;  Surgeon: Lucilla Lame, MD;  Location: [place];  Service: Gastroenterology;;  Transverse colon polyp x 2 Ascending colon polyp x 3  . TONSILLECTOMY      There were no vitals filed for this visit.    Pelvic Floor Physical Therapy Treatment Note  SCREENING  Changes in medications, allergies, or medical history?: no     SUBJECTIVE  Patient reports: She has to mow today and does most of it with a riding mower but does have to use a push-mower for some of it. She has been  making sure to engage her lower tummy when pushing anl pulling to protect prolapse. Has been using her fitness balance board and doing twists to try to help her balance. She has been working on trying to improve her wrist mobility.   Pain update:  Location of pain: R low back and  Current pain:  3/10  Max pain:  3/10 Least pain:  0/10 Nature of pain: achy  Patient Goals: Decrease Prolapse feeling   OBJECTIVE  Changes in: Posture/Observations:  L shoulder high. L long in supine, R in sitting  Range of Motion/Flexibilty:  Pain and ~ 30% decreased motion with R side-bending before treatment, no pain and only ~ 10% reduction following treatment.  Abdominal:  Pt. Has a very hard time maintaining deep core activation while engaging hip musculature during MET correction and is not recruiting well with cough.  INTERVENTIONS THIS SESSION: Manual: Performed TP release to R Psoas, QL, and glute med as well as sacral mobilizations along R sacral border followed by R LE distraction and quick-pull for up-slip correction as well as MET correction for L anterior rotation. NM re-ed: Reviewed supine pelvic tilts for deep core stability and educated on pulling TA in with cough/sneeze to prevent intra-abdominal pressure on the diastasis  and prolapse.  Total time: 57 min.                            PT Education - 08/05/17 1243    Education provided  Yes    Education Details  See Interventions this session    Person(s) Educated  Patient    Methods  Explanation;Demonstration;Tactile cues;Verbal cues    Comprehension  Verbalized understanding;Returned demonstration;Verbal cues required;Tactile cues required;Need further instruction       PT Short Term Goals - 08/05/17 1249      PT SHORT TERM GOAL #1   Title  Patient will demonstrate functional recruitment of TA with breathing, sit-to-stand, squatting/lifting, and walking to allow for improved pelvic brace coordination,  improved balance, and decreased downward pressure on the pelvic organs.    Time  5    Period  Weeks    Status  On-going    Target Date  08/12/17      PT SHORT TERM GOAL #2   Title  Patient will demonstrate improved sitting and standing posture to demonstrate learning and decrease stress on the pelvic floor with functional activity.    Time  5    Status  On-going    Target Date  08/12/17        PT Long Term Goals - 07/08/17 1331      PT LONG TERM GOAL #1   Title  Patient will report no episodes of urge incontinence over the course of the prior two weeks to demonstrate improved functional ability.    Time  10    Period  Weeks    Status  New    Target Date  09/16/17      PT LONG TERM GOAL #2   Title  Patient will describe feeling of pressure no more than 20% of the time over the course of the past week to demonstrate improved recruitment and strength of the pelvic floor.    Baseline  80%    Time  10    Period  Weeks    Status  New    Target Date  09/16/17      PT LONG TERM GOAL #3   Title  Patient will score at or below 11/300 on the PFDI and 0/300 on the PFIQ to demonstrate a clinically meaningful decrease in disability and distress due to pelvic floor dysfunction.    Baseline  PFDI: 56/300, PFDI: 29/300    Time  10    Period  Weeks    Status  New    Target Date  09/16/17            Plan - 08/05/17 1244    Clinical Impression Statement  Pt is demonstrating decreased feeling of prolapse at home and improved body mechanics with activities. She demonstrated within session decrease of pain and movement restriction with R side-bending as well as improved pelvic allignment folowing treatment today. Continue per POC.    Clinical Presentation  Stable    Clinical Decision Making  Moderate    Rehab Potential  Good    PT Frequency  1x / week    PT Duration  Other (comment)    PT Treatment/Interventions  ADLs/Self Care Home Management;Biofeedback;Electrical  Stimulation;Functional mobility training;Therapeutic activities;Therapeutic exercise;Patient/family education;Neuromuscular re-education;Manual techniques;Scar mobilization;Taping    PT Next Visit Plan   review pelvic tilt in supine and then sitting and standing, practice posture in mirror, add stretches for hip-flexors, low back, and  sides.    PT Home Exercise Plan  hook-lying pelvic tilts with kegel, soda-can, squatty potty, kegels, Log-roll, sit-to-stand.    Consulted and Agree with Plan of Care  Patient       Patient will benefit from skilled therapeutic intervention in order to improve the following deficits and impairments:  Increased fascial restricitons, Improper body mechanics, Pain, Decreased coordination, Decreased scar mobility, Increased muscle spasms, Postural dysfunction, Decreased endurance, Decreased strength  Visit Diagnosis: Muscle weakness (generalized)  Abnormal posture  Cystocele with prolapse     Problem List Patient Active Problem List   Diagnosis Date Noted  . Cystocele, midline 07/09/2017  . Uterine prolapse 07/09/2017  . Special screening for malignant neoplasms, colon   . Benign neoplasm of ascending colon   . Benign neoplasm of transverse colon   . Cough 08/20/2015  . 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 Osteopenia 02/02/2015  . Health care maintenance 08/06/2014  . Stress 02/16/2014  . Hypertension 05/03/2012  . Hypercholesterolemia 05/03/2012  . Hyperglycemia 05/03/2012  . Environmental allergies 05/03/2012   Willa Rough DPT, ATC Willa Rough 08/05/2017, 12:52 PM  [place] MAIN [facility] SERVICES [address] [place], [place], [address] Phone: [phone]   Fax:  [phone]  Name: Donna Lara MRN: [id number] Date of Birth: 1940-09-27

## 2017-08-11 ENCOUNTER — Other Ambulatory Visit: Payer: Self-pay | Admitting: Internal Medicine

## 2017-08-11 ENCOUNTER — Ambulatory Visit (INDEPENDENT_AMBULATORY_CARE_PROVIDER_SITE_OTHER): Payer: Medicare Other

## 2017-08-11 ENCOUNTER — Ambulatory Visit (INDEPENDENT_AMBULATORY_CARE_PROVIDER_SITE_OTHER): Payer: Medicare Other | Admitting: Internal Medicine

## 2017-08-11 ENCOUNTER — Encounter: Payer: Self-pay | Admitting: Internal Medicine

## 2017-08-11 VITALS — BP 132/60 | HR 81 | Temp 98.9°F | Ht 62.0 in | Wt 139.2 lb

## 2017-08-11 DIAGNOSIS — M79644 Pain in right finger(s): Secondary | ICD-10-CM

## 2017-08-11 DIAGNOSIS — G4700 Insomnia, unspecified: Secondary | ICD-10-CM | POA: Insufficient documentation

## 2017-08-11 DIAGNOSIS — M19031 Primary osteoarthritis, right wrist: Secondary | ICD-10-CM | POA: Diagnosis not present

## 2017-08-11 DIAGNOSIS — M25531 Pain in right wrist: Secondary | ICD-10-CM

## 2017-08-11 DIAGNOSIS — F419 Anxiety disorder, unspecified: Secondary | ICD-10-CM | POA: Insufficient documentation

## 2017-08-11 DIAGNOSIS — M19041 Primary osteoarthritis, right hand: Secondary | ICD-10-CM | POA: Diagnosis not present

## 2017-08-11 MED ORDER — METHYLPREDNISOLONE ACETATE 40 MG/ML IJ SUSP: 40.0000 mg | Freq: Once | INTRAMUSCULAR | Status: DC

## 2017-08-11 MED ORDER — CLONAZEPAM 0.5 MG PO TABS: 0.5000 mg | ORAL_TABLET | Freq: Every day | ORAL | 1 refills | Status: DC | PRN

## 2017-08-11 NOTE — Progress Notes (Signed)
Chief Complaint  Patient presents with  . Hand Pain   C/o right thumb pain and swelling and right wrist pain worsening over 2 weeks no h/p trauma, pain worse with ROM cant bend thumb and cranking the car motion had worse h/o CTS b/l s/p surgeries b/l per pt. She tried Aleve x 1 today w/o relief. She has been doing pelvic PT and using hands on the floor and not sure if this could have triggered it   Review of Systems  Constitutional: Negative for weight loss.  Musculoskeletal: Positive for joint pain.  Skin: Negative for rash.   Past Medical History:  Diagnosis Date  . Allergy   . Carpal tunnel syndrome 1999  . GERD (gastroesophageal reflux disease)   . Hypercholesterolemia    By last lipid panel  . Hypertension    Borderline. She does not take the 12.5 HCTZ daily   Past Surgical History:  Procedure Laterality Date  . APPENDECTOMY  1963  . CARPAL TUNNEL RELEASE     right hand  . COLONOSCOPY    . COLONOSCOPY WITH PROPOFOL N/A 12/01/2016   Procedure: COLONOSCOPY WITH PROPOFOL;  Surgeon: Lucilla Lame, MD;  Location: [place];  Service: Gastroenterology;  Laterality: N/A;  . EYE SURGERY     left  . POLYPECTOMY  12/01/2016   Procedure: POLYPECTOMY INTESTINAL;  Surgeon: Lucilla Lame, MD;  Location: [place];  Service: Gastroenterology;;  Transverse colon polyp x 2 Ascending colon polyp x 3  . TONSILLECTOMY     Family History  Problem Relation Age of Onset  . Diabetes Mother   . Hypertension Brother   . Diabetes Maternal Grandmother   . Asthma Daughter   . Breast cancer Other   . Colon cancer Neg Hx    Social History   Socioeconomic History  . Marital status: Married    Spouse name: Not on file  . Number of children: 3  . Years of education: Not on file  . Highest education level: Not on file  Occupational History    Employer: OTHER  Social Needs  . Financial resource strain: Not on file  . Food insecurity:    Worry: Not on file    Inability: Not  on file  . Transportation needs:    Medical: Not on file    Non-medical: Not on file  Tobacco Use  . Smoking status: Never Smoker  . Smokeless tobacco: Never Used  Substance and Sexual Activity  . Alcohol use: Yes    Alcohol/week: 0.0 oz    Comment: 2-3 drinks/month  . Drug use: No  . Sexual activity: Never  Lifestyle  . Physical activity:    Days per week: Not on file    Minutes per session: Not on file  . Stress: Not on file  Relationships  . Social connections:    Talks on phone: Not on file    Gets together: Not on file    Attends religious service: Not on file    Active member of club or organization: Not on file    Attends meetings of clubs or organizations: Not on file    Relationship status: Not on file  . Intimate partner violence:    Fear of current or ex partner: Not on file    Emotionally abused: Not on file    Physically abused: Not on file    Forced sexual activity: Not on file  Other Topics Concern  . Not on file  Social History Narrative  Recently widowed. Her husband died of a heart attack in 05-07-2015   Current Meds  Medication Sig  . ALOE VERA PO Take 1 tablet by mouth daily.   . Cholecalciferol (VITAMIN D3) 1000 UNITS CAPS Take 1,000 Units by mouth 2 (two) times daily.   . clonazePAM (KLONOPIN) 0.5 MG tablet TAKE 1 TABLET DAILY. (Patient taking differently: As needed)  . co-enzyme Q-10 30 MG capsule Take 30 mg by mouth 2 (two) times daily.  Marland Kitchen conjugated estrogens (PREMARIN) vaginal cream Place 1 Applicatorful vaginally daily. Apply 0.5mg  (pea-sized amount)  just inside the vaginal introitus with a finger-tip on  Monday, Wednesday and Friday nights.  . CYANOCOBALAMIN PO Take 1 tablet by mouth daily.   Marland Kitchen estradiol (ESTRACE VAGINAL) 0.1 MG/GM vaginal cream Apply 0.5mg  (pea-sized amount)  just inside the vaginal introitus with a finger-tip on Monday, Wednesday and Friday nights.  . fluticasone (FLONASE) 50 MCG/ACT nasal spray Place 2 sprays into both  nostrils daily.  . hydrochlorothiazide (HYDRODIURIL) 12.5 MG tablet Take 12.5 mg by mouth as needed (as needed for edema and hypertension).   . magnesium 30 MG tablet Take 30 mg by mouth 1 day or 1 dose.  Marland Kitchen MAGNESIUM CITRATE PO Take 1 tablet by mouth daily.   . mometasone (ELOCON) 0.1 % lotion Apply topically daily. For no more than 7 days.  Marland Kitchen omega-3 acid ethyl esters (LOVAZA) 1 g capsule Take by mouth 3 (three) times daily.  . Omega-3 Fatty Acids (FISH OIL) 1000 MG CAPS Take 1,000 mg by mouth 2 (two) times daily.   . Red Yeast Rice Extract (RED YEAST RICE PO) Take 2 capsules by mouth daily.  . vitamin C (ASCORBIC ACID) 500 MG tablet Take 500 mg by mouth 2 (two) times daily.  . [DISCONTINUED] fluconazole (DIFLUCAN) 100 MG tablet Take 100 mg by mouth once.   Allergies  Allergen Reactions  . Codeine Sulfate     Insomnia. Out of control.   Recent Results (from the past 2160 hour(s))  BLADDER SCAN AMB NON-IMAGING     Status: None   Collection Time: 06/22/17  3:24 PM  Result Value Ref Range   Scan Result 75ml   POC Influenza A&B(BINAX/QUICKVUE)     Status: Abnormal   Collection Time: 06/24/17  4:31 PM  Result Value Ref Range   Influenza A, POC Positive (A) Negative   Influenza B, POC Negative Negative   Objective  Body mass index is 25.46 kg/m. Wt Readings from Last 3 Encounters:  08/11/17 139 lb 3.2 oz (63.1 kg)  07/20/17 136 lb (61.7 kg)  07/09/17 136 lb (61.7 kg)   Temp Readings from Last 3 Encounters:  08/11/17 98.9 F (37.2 C) (Oral)  07/20/17 (!) 97.4 F (36.3 C) (Oral)  06/24/17 98.3 F (36.8 C) (Oral)   BP Readings from Last 3 Encounters:  08/11/17 132/60  07/20/17 (!) 146/90  07/09/17 128/80   Pulse Readings from Last 3 Encounters:  08/11/17 81  07/20/17 67  06/24/17 78    Physical Exam  Constitutional: She is oriented to person, place, and time. Vital signs are normal. She appears well-developed and well-nourished. She is cooperative.  HENT:  Head:  Normocephalic and atraumatic.  Eyes: Pupils are equal, round, and reactive to light. Conjunctivae are normal.  Cardiovascular: Normal rate, regular rhythm and normal heart sounds.  Pulmonary/Chest: Effort normal and breath sounds normal.  Musculoskeletal:       Right wrist: She exhibits tenderness and swelling.  Arms: Neurological: She is alert and oriented to person, place, and time. Gait normal.  Skin: Skin is warm, dry and intact.  Psychiatric: She has a normal mood and affect. Her speech is normal and behavior is normal. Judgment and thought content normal. Cognition and memory are normal.  Nursing note and vitals reviewed.   Assessment   1. Right wrist/thumb pain and swelling ddx DQ tenosynovitis vs arthritis CMC flare  Plan   1. Prn ICE, heat Prn NSAID  depomedrol x 40 x 1 today  Xray right hand/wrist today  F/u with PCP in 1-2 week s   Provider: Dr. Olivia Mackie Mc[facility]-Scocuzza-Internal Medicine

## 2017-08-11 NOTE — Patient Instructions (Addendum)
Try ice or heat to right hand, wrist  As needed Ibuprofen/Aleve We gave you a steroid injection today  F/u with PCP in 1-2 weeks sooner if needed  Tylenol 500 mg max is 6 pills 1 day or 3000 mg/24 hours Ibuprofen no more than rec dose on bottle   Arthritis Arthritis is a term that is commonly used to refer to joint pain or joint disease. There are more than 100 types of arthritis. What are the causes? The most common cause of this condition is wear and tear of a joint. Other causes include:  Gout.  Inflammation of a joint.  An infection of a joint.  Sprains and other injuries near the joint.  A drug reaction or allergic reaction.  In some cases, the cause may not be known. What are the signs or symptoms? The main symptom of this condition is pain in the joint with movement. Other symptoms include:  Redness, swelling, or stiffness at a joint.  Warmth coming from the joint.  Fever.  Overall feeling of illness.  How is this diagnosed? This condition may be diagnosed with a physical exam and tests, including:  Blood tests.  Urine tests.  Imaging tests, such as MRI, X-rays, or a CT scan.  Sometimes, fluid is removed from a joint for testing. How is this treated? Treatment for this condition may involve:  Treatment of the cause, if it is known.  Rest.  Raising (elevating) the joint.  Applying cold or hot packs to the joint.  Medicines to improve symptoms and reduce inflammation.  Injections of a steroid such as cortisone into the joint to help reduce pain and inflammation.  Depending on the cause of your arthritis, you may need to make lifestyle changes to reduce stress on your joint. These changes may include exercising more and losing weight. Follow these instructions at home: Medicines  Take over-the-counter and prescription medicines only as told by your health care provider.  Do not take aspirin to relieve pain if gout is suspected. Activity  Rest  your joint if told by your health care provider. Rest is important when your disease is active and your joint feels painful, swollen, or stiff.  Avoid activities that make the pain worse. It is important to balance activity with rest.  Exercise your joint regularly with range-of-motion exercises as told by your health care provider. Try doing low-impact exercise, such as: ? Swimming. ? Water aerobics. ? Biking. ? Walking. Joint Care   If your joint is swollen, keep it elevated if told by your health care provider.  If your joint feels stiff in the morning, try taking a warm shower.  If directed, apply heat to the joint. If you have diabetes, do not apply heat without permission from your health care provider. ? Put a towel between the joint and the hot pack or heating pad. ? Leave the heat on the area for 20-30 minutes.  If directed, apply ice to the joint: ? Put ice in a plastic bag. ? Place a towel between your skin and the bag. ? Leave the ice on for 20 minutes, 2-3 times per day.  Keep all follow-up visits as told by your health care provider. This is important. Contact a health care provider if:  The pain gets worse.  You have a fever. Get help right away if:  You develop severe joint pain, swelling, or redness.  Many joints become painful and swollen.  You develop severe back pain.  You develop severe  weakness in your leg.  You cannot control your bladder or bowels. This information is not intended to replace advice given to you by your health care provider. Make sure you discuss any questions you have with your health care provider. Document Released: 05/01/2004 Document Revised: 08/30/2015 Document Reviewed: 06/19/2014 Elsevier Interactive Patient Education  2018 [place].  Tenosynovitis Tenosynovitis is inflammation of a tendon and the sleeve of tissue that covers the tendon (tendon sheath). A tendon is a cord of tissue that connects muscle to bone.  Normally, a tendon slides smoothly inside its tendon sheath. Tenosynovitis limits movement of the tendon and surrounding tissues, which may cause pain and stiffness. Tenosynovitis can affect any tendon and tendon sheath. Commonly affected areas include tendons in the:  Shoulder.  Arm.  Hand.  Hip.  Leg.  Foot.  What are the causes? The main cause of this condition is wear and tear over time that results in slight tears in the tendon. Other possible causes include:  A sudden injury to the tendon or tendon sheath.  A disease that causes inflammation in the body.  An infection that spreads to the tendon and tendon sheath from a skin wound.  An infection in another part of the body that spreads to the tendon and tendon sheath through the blood.  What increases the risk? The following factors may make you more likely to develop this condition:  Having rheumatoid arthritis, gout, or diabetes.  Using IV drugs.  Doing physical activities that can cause tendon overuse and stress.  Having gonorrhea.  What are the signs or symptoms? Symptoms of this condition depend on the cause. Symptoms may include:  Pain with movement.  Pain and tenderness when pressing on the tendon and tendon sheath.  Swelling.  Stiffness.  If tenosynovitis is caused by an infection, symptoms may also include:  Fever.  Redness.  Warmth.  How is this diagnosed? This condition may be diagnosed based on your medical history and a physical exam. You also may have:  Blood tests.  Imaging tests, such as: ? MRI. ? Ultrasound.  A sample of fluid removed from inside the tendon sheath to be checked in a lab.  How is this treated? Treatment for this condition depends on the cause. If tenosynovitis is not caused by an infection, treatment may include:  Resting the tendon.  Keeping the tendon in place for periods of time (immobilization) in a splint, brace, or sling.  NSAIDs to reduce pain and  swelling.  A shot (injection) of medicine to help reduce pain and swelling (steroid).  Icing or applying heat to the affected area.  Physical therapy.  Surgery to release the tendon in the sheath or to repair damage to the tendon or tendon sheath. Surgery may be done if other treatments do not help relieve symptoms.  If tenosynovitis is caused by infection, treatment may include antibiotic medicine given through an IV. In some cases, surgery may be needed to drain fluid from the tendon sheath or to remove the tendon sheath. Follow these instructions at home: If you have a splint, brace, or sling:   Wear the splint, brace, or sling as told by your health care provider. Remove it only as told by your health care provider.  Loosen the splint, brace, or sling if your fingers or toes tingle, become numb, or turn cold and blue.  Do not let your splint or brace get wet if it is not waterproof. ? Do not take baths, swim, or use  a hot tub until your health care provider approves. Ask your health care provider if you can take showers. ? If your splint, brace, or sling is not waterproof, cover it with a watertight covering when you take a bath or a shower.  Keep the splint, brace, or sling clean. Managing pain, stiffness, and swelling  If directed, apply ice to the affected area. ? Put ice in a plastic bag. ? Place a towel between your skin and the bag. ? Leave the ice on for 20 minutes, 2-3 times a day.  Move the fingers or toes of the affected limb often, if this applies. This can help to prevent stiffness and lessen swelling.  If directed, raise (elevate) the affected area above the level of your heart while you are sitting or lying down.  If directed, apply heat to the affected area as often as told by your health care provider. Use the heat source that your health care provider recommends, such as a moist heat pack or a heating pad. ? Place a towel between your skin and the heat  source. ? Leave the heat on for 20-30 minutes. ? Remove the heat if your skin turns bright red. This is especially important if you are unable to feel pain, heat, or cold. You may have a greater risk of getting burned. Driving  Do not drive or operate heavy machinery while taking prescription pain medicine.  Ask your health care provider when it is safe to drive if you have a splint or brace on any part of your arm or leg. Activity  Return to your normal activities as told by your health care provider. Ask your health care provider what activities are safe for you.  Rest the affected area as told by your health care provider.  Avoid using the affected area while you are having symptoms of tenosynovitis.  If physical therapy was prescribed, do exercises as told by your health care provider. Safety  Do not use the injured limb to support your body weight until your health care provider says that you can. General instructions  Take over-the-counter and prescription medicines only as told by your health care provider.  Keep all follow-up visits as told by your health care provider. This is important. Contact a health care provider if:  Your symptoms are not improving or are getting worse.  You have a fever and more of any of the following symptoms: ? Pain. ? Redness. ? Warmth. ? Swelling. Get help right away if:  Your fingers or toes become numb or turn blue. This information is not intended to replace advice given to you by your health care provider. Make sure you discuss any questions you have with your health care provider. Document Released: 03/24/2005 Document Revised: 11/22/2015 Document Reviewed: 01/31/2015 Elsevier Interactive Patient Education  [employer].

## 2017-08-11 NOTE — Progress Notes (Signed)
Pre visit review using our clinic review tool, if applicable. No additional management support is needed unless otherwise documented below in the visit note. 

## 2017-08-17 ENCOUNTER — Ambulatory Visit: Payer: Medicare Other

## 2017-08-17 DIAGNOSIS — N814 Uterovaginal prolapse, unspecified: Secondary | ICD-10-CM

## 2017-08-17 DIAGNOSIS — M6281 Muscle weakness (generalized): Secondary | ICD-10-CM

## 2017-08-17 DIAGNOSIS — R293 Abnormal posture: Secondary | ICD-10-CM

## 2017-08-17 NOTE — Patient Instructions (Addendum)
  When seated, you want to maintain pelvic neutral with the shoulders gently down and back and ears in line with your shoulders. A lumbar roll such as the one below or a home-made towel-roll can be used for this purpose. Even Olympic athletes can only maintain proper seated posture for about 10 minutes without support!    Pictured: The Original [employer] Lumbar Roll       Do 2 sets of 15 tilts per day. Breathe in when you tilt forward (A) and out when you tuck under (B).     Bring both knees up to your chest and then hold the one farthest from the edge of the table/bed and let the other relax toward the floor until stretch is felt through the front of the hip.  Hold for __5__ deep belly breaths. Relax. Repeat __2-3__ times per side.   Do this __1-2__ times per day.       Hold for 30 seconds (5 deep breaths) and repeat 2-3 times on each side once a day    Tuck your tail under and then reach forward to stretch out the low back. Hold for 30 seconds, repeat 2-3 times once a day.

## 2017-08-17 NOTE — Therapy (Signed)
[place] [facility] SERVICES [address] [place], [place], [address] Phone: [phone]   Fax:  [phone]  Physical Therapy Treatment  Patient Details  Name: Donna Lara MRN: [id number] Date of Birth: Sep 01, 1940 Referring Provider: Zara Council   Encounter Date: 08/17/2017  PT End of Session - 08/17/17 1415    Visit Number  6    Number of Visits  10    Date for PT Re-Evaluation  09/16/17    Authorization Type  Medicare    Authorization - Visit Number  6    Authorization - Number of Visits  10    PT Start Time  0240    PT Stop Time  1140    PT Time Calculation (min)  60 min    Activity Tolerance  Patient tolerated treatment well    Behavior During Therapy  [facility] for tasks assessed/performed       Past Medical History:  Diagnosis Date  . Allergy   . Carpal tunnel syndrome 1999  . GERD (gastroesophageal reflux disease)   . Hypercholesterolemia    By last lipid panel  . Hypertension    Borderline. She does not take the 12.5 HCTZ daily    Past Surgical History:  Procedure Laterality Date  . APPENDECTOMY  1963  . CARPAL TUNNEL RELEASE     right hand  . COLONOSCOPY    . COLONOSCOPY WITH PROPOFOL N/A 12/01/2016   Procedure: COLONOSCOPY WITH PROPOFOL;  Surgeon: Lucilla Lame, MD;  Location: [place];  Service: Gastroenterology;  Laterality: N/A;  . EYE SURGERY     left  . POLYPECTOMY  12/01/2016   Procedure: POLYPECTOMY INTESTINAL;  Surgeon: Lucilla Lame, MD;  Location: [place];  Service: Gastroenterology;;  Transverse colon polyp x 2 Ascending colon polyp x 3  . TONSILLECTOMY      There were no vitals filed for this visit.    Pelvic Floor Physical Therapy Treatment Note  SCREENING  Changes in medications, allergies, or medical history?:no     SUBJECTIVE  Patient reports: Has had some knee pain behind R knee, thinks it may be from new sleeping pillow, otherwise no pain. Feels that she is doing ok  with the prolapse.    Pain update:  Location of pain: Behind R knee Current pain:  1/10  Max pain:  710 Least pain:  0/10 Nature of pain: achy  Patient Goals: Decrease Prolapse feeling   OBJECTIVE  Changes in: Posture/Observations:  Pt. Is able to actively achieve appropriate posture but lacks proprioceptive awareness of when she is/is not using good posture.  Range of Motion/Flexibilty:  Thoracic stiffness impeding optimal posture.  INTERVENTIONS THIS SESSION: Therex: educated on and practiced side-stretch, hip-flexor stretch, and low back stretch to improve balance of musculature surrounding the pelvis. NM re-ed: Educated on and practiced seated pelvic tilts, sitting and standing posture and reviewed supine pelvic tilts with added hands to approximate diastasis all to improve coordination, proprioception, and recruitment of core musculature to down-regulate lumbar extensors and increase anterior core stabilizers for balance of musculature acting on the pelvis.  Total time: 60 min.                          PT Education - 08/17/17 1415    Education provided  Yes    Education Details  See Pt. Instructions and Interventions this session.    Person(s) Educated  Patient    Methods  Explanation;Demonstration;Handout  Comprehension  Verbalized understanding;Returned demonstration;Verbal cues required       PT Short Term Goals - 08/05/17 1249      PT SHORT TERM GOAL #1   Title  Patient will demonstrate functional recruitment of TA with breathing, sit-to-stand, squatting/lifting, and walking to allow for improved pelvic brace coordination, improved balance, and decreased downward pressure on the pelvic organs.    Time  5    Period  Weeks    Status  On-going    Target Date  08/12/17      PT SHORT TERM GOAL #2   Title  Patient will demonstrate improved sitting and standing posture to demonstrate learning and decrease stress on the pelvic floor with  functional activity.    Time  5    Status  On-going    Target Date  08/12/17        PT Long Term Goals - 07/08/17 1331      PT LONG TERM GOAL #1   Title  Patient will report no episodes of urge incontinence over the course of the prior two weeks to demonstrate improved functional ability.    Time  10    Period  Weeks    Status  New    Target Date  09/16/17      PT LONG TERM GOAL #2   Title  Patient will describe feeling of pressure no more than 20% of the time over the course of the past week to demonstrate improved recruitment and strength of the pelvic floor.    Baseline  80%    Time  10    Period  Weeks    Status  New    Target Date  09/16/17      PT LONG TERM GOAL #3   Title  Patient will score at or below 11/300 on the PFDI and 0/300 on the PFIQ to demonstrate a clinically meaningful decrease in disability and distress due to pelvic floor dysfunction.    Baseline  PFDI: 56/300, PFDI: 29/300    Time  10    Period  Weeks    Status  New    Target Date  09/16/17            Plan - 08/17/17 1416    Clinical Impression Statement  Pt. responded well to all interventions today, she demonstrated understanding of all education provided and appropriate performance of exercises as well as improved postural awareness. Continue per POC.    Clinical Presentation  Stable    Clinical Decision Making  Moderate    Rehab Potential  Good    PT Frequency  1x / week    PT Duration  Other (comment)    PT Treatment/Interventions  ADLs/Self Care Home Management;Biofeedback;Electrical Stimulation;Functional mobility training;Therapeutic activities;Therapeutic exercise;Patient/family education;Neuromuscular re-education;Manual techniques;Scar mobilization;Taping    PT Next Visit Plan  review pelvic tilts in supine and attempt mini-marches and multi-direction stepping.     PT Home Exercise Plan  hook-lying pelvic tilts with kegel, soda-can, squatty potty, kegels, Log-roll, sit-to-stand.     Consulted and Agree with Plan of Care  Patient       Patient will benefit from skilled therapeutic intervention in order to improve the following deficits and impairments:  Increased fascial restricitons, Improper body mechanics, Pain, Decreased coordination, Decreased scar mobility, Increased muscle spasms, Postural dysfunction, Decreased endurance, Decreased strength  Visit Diagnosis: Muscle weakness (generalized)  Abnormal posture  Cystocele with prolapse     Problem List Patient Active Problem List  Diagnosis Date Noted  . Insomnia 08/11/2017  . Anxiety 08/11/2017  . Cystocele, midline 07/09/2017  . Uterine prolapse 07/09/2017  . Special screening for malignant neoplasms, colon   . Benign neoplasm of ascending colon   . Benign neoplasm of transverse colon   . Cough 08/20/2015  . 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 Osteopenia 02/02/2015  . Health care maintenance 08/06/2014  . Stress 02/16/2014  . Hypertension 05/03/2012  . Hypercholesterolemia 05/03/2012  . Hyperglycemia 05/03/2012  . Environmental allergies 05/03/2012   Willa Rough DPT, ATC Willa Rough 08/17/2017, 2:28 PM  [place] [facility] SERVICES [address] [place], [place], [address] Phone: [phone]   Fax:  [phone]  Name: VERONIQUE WARGA MRN: [id number] Date of Birth: 1941-01-11

## 2017-08-28 ENCOUNTER — Ambulatory Visit: Payer: Medicare Other

## 2017-09-01 ENCOUNTER — Other Ambulatory Visit (INDEPENDENT_AMBULATORY_CARE_PROVIDER_SITE_OTHER): Payer: Medicare Other

## 2017-09-01 DIAGNOSIS — R739 Hyperglycemia, unspecified: Secondary | ICD-10-CM | POA: Diagnosis not present

## 2017-09-01 DIAGNOSIS — I10 Essential (primary) hypertension: Secondary | ICD-10-CM

## 2017-09-01 DIAGNOSIS — E7800 Pure hypercholesterolemia, unspecified: Secondary | ICD-10-CM

## 2017-09-01 LAB — HEPATIC FUNCTION PANEL
ALK PHOS: 82 U/L (ref 39–117)
ALT: 15 U/L (ref 0–35)
AST: 20 U/L (ref 0–37)
Albumin: 4.2 g/dL (ref 3.5–5.2)
BILIRUBIN TOTAL: 0.8 mg/dL (ref 0.2–1.2)
Bilirubin, Direct: 0.1 mg/dL (ref 0.0–0.3)
Total Protein: 7.3 g/dL (ref 6.0–8.3)

## 2017-09-01 LAB — LIPID PANEL
Cholesterol: 207 mg/dL — ABNORMAL HIGH (ref 0–200)
HDL: 76.6 mg/dL (ref >39.00)
LDL Cholesterol: 120 mg/dL — ABNORMAL HIGH (ref 0–99)
NONHDL: 130.85
Total CHOL/HDL Ratio: 3
Triglycerides: 56 mg/dL (ref 0.0–149.0)
VLDL: 11.2 mg/dL (ref 0.0–40.0)

## 2017-09-01 LAB — BASIC METABOLIC PANEL
BUN: 21 mg/dL (ref 6–23)
CALCIUM: 9.8 mg/dL (ref 8.4–10.5)
CO2: 28 mEq/L (ref 19–32)
CREATININE: 0.77 mg/dL (ref 0.40–1.20)
Chloride: 104 mEq/L (ref 96–112)
GFR: 77.77 mL/min (ref >60.00)
GLUCOSE: 97 mg/dL (ref 70–99)
Potassium: 4.6 mEq/L (ref 3.5–5.1)
SODIUM: 139 meq/L (ref 135–145)

## 2017-09-01 LAB — HEMOGLOBIN A1C: Hgb A1c MFr Bld: 5.8 % (ref 4.6–6.5)

## 2017-09-02 ENCOUNTER — Ambulatory Visit: Payer: Federal, State, Local not specified - PPO | Admitting: Internal Medicine

## 2017-09-02 DIAGNOSIS — Z0289 Encounter for other administrative examinations: Secondary | ICD-10-CM

## 2017-09-03 ENCOUNTER — Ambulatory Visit: Payer: Medicare Other

## 2017-09-03 DIAGNOSIS — R293 Abnormal posture: Secondary | ICD-10-CM

## 2017-09-03 DIAGNOSIS — M6281 Muscle weakness (generalized): Secondary | ICD-10-CM

## 2017-09-03 DIAGNOSIS — N814 Uterovaginal prolapse, unspecified: Secondary | ICD-10-CM

## 2017-09-03 NOTE — Therapy (Signed)
[place] MAIN [facility] SERVICES [address] [place], [place], [address] Phone: [phone]   Fax:  [phone]  Physical Therapy Treatment  Patient Details  Name: Donna Lara MRN: [id number] Date of Birth: 1940-08-05 Referring Provider: Zara Council   Encounter Date: 09/03/2017  PT End of Session - 09/04/17 1908    Visit Number  7    Number of Visits  10    Date for PT Re-Evaluation  09/16/17    Authorization Type  Medicare    Authorization - Visit Number  7    Authorization - Number of Visits  10    PT Start Time  8676    PT Stop Time  1530    PT Time Calculation (min)  33 min    Activity Tolerance  Patient tolerated treatment well    Behavior During Therapy  [facility] for tasks assessed/performed       Past Medical History:  Diagnosis Date  . Allergy   . Carpal tunnel syndrome 1999  . GERD (gastroesophageal reflux disease)   . Hypercholesterolemia    By last lipid panel  . Hypertension    Borderline. She does not take the 12.5 HCTZ daily    Past Surgical History:  Procedure Laterality Date  . APPENDECTOMY  1963  . CARPAL TUNNEL RELEASE     right hand  . COLONOSCOPY    . COLONOSCOPY WITH PROPOFOL N/A 12/01/2016   Procedure: COLONOSCOPY WITH PROPOFOL;  Surgeon: Lucilla Lame, MD;  Location: [place];  Service: Gastroenterology;  Laterality: N/A;  . EYE SURGERY     left  . POLYPECTOMY  12/01/2016   Procedure: POLYPECTOMY INTESTINAL;  Surgeon: Lucilla Lame, MD;  Location: [place];  Service: Gastroenterology;;  Transverse colon polyp x 2 Ascending colon polyp x 3  . TONSILLECTOMY      There were no vitals filed for this visit.    Pelvic Floor Physical Therapy Treatment Note  SCREENING  Changes in medications, allergies, or medical history?: no   SUBJECTIVE  Patient reports: She is running late She is doing well, has been able to get on the elliptical and not have it bother her. She has the most  pressure after BM but it is still not as bad and she feels better after doing kegels with hips elevated..    Pain update: No pain  Patient Goals: Decrease Prolapse feeling    OBJECTIVE  Changes in: Posture/Observations:  Patient was running late today and was very anxious and had a hard time following directions. She was able to be successful with exercises long enough to record a video on her phone for her to use as reference for at-home practice. Lumbar extensors and obliques are still very dominant, making TA recruitment difficult and Pt. Is easily distracted from proper breathing pattern, wanting to hold her breath.  INTERVENTIONS THIS SESSION: NM re-ed: reviewed pelvic tilts with breathing in hook-lying and educated on and practiced mini-marches to further improve coordination muscular balance around the pelvis for decreased pressure on nerve roots and improved PFM function to support prolapse.  Total time: 33 min                        PT Education - 09/04/17 1908    Education provided  Yes    Education Details  See Interventions this session.    Person(s) Educated  Patient    Methods  Explanation;Demonstration;Tactile cues;Verbal cues  Comprehension  Verbalized understanding;Returned demonstration;Verbal cues required;Tactile cues required;Need further instruction       PT Short Term Goals - 08/05/17 1249      PT SHORT TERM GOAL #1   Title  Patient will demonstrate functional recruitment of TA with breathing, sit-to-stand, squatting/lifting, and walking to allow for improved pelvic brace coordination, improved balance, and decreased downward pressure on the pelvic organs.    Time  5    Period  Weeks    Status  On-going    Target Date  08/12/17      PT SHORT TERM GOAL #2   Title  Patient will demonstrate improved sitting and standing posture to demonstrate learning and decrease stress on the pelvic floor with functional activity.    Time  5     Status  On-going    Target Date  08/12/17        PT Long Term Goals - 07/08/17 1331      PT LONG TERM GOAL #1   Title  Patient will report no episodes of urge incontinence over the course of the prior two weeks to demonstrate improved functional ability.    Time  10    Period  Weeks    Status  New    Target Date  09/16/17      PT LONG TERM GOAL #2   Title  Patient will describe feeling of pressure no more than 20% of the time over the course of the past week to demonstrate improved recruitment and strength of the pelvic floor.    Baseline  80%    Time  10    Period  Weeks    Status  New    Target Date  09/16/17      PT LONG TERM GOAL #3   Title  Patient will score at or below 11/300 on the PFDI and 0/300 on the PFIQ to demonstrate a clinically meaningful decrease in disability and distress due to pelvic floor dysfunction.    Baseline  PFDI: 56/300, PFDI: 29/300    Time  10    Period  Weeks    Status  New    Target Date  09/16/17            Plan - 09/04/17 1909    Clinical Impression Statement  Patient was very nervous and anxious today, demonstrating increased difficulty following instructions, wanting to "jump the gun" more than at prior visits but was able to eventually achieve moderate success with the exercises practiced enough that it could be recorded on her phone for home practice. She states that she is doing much better and is happy overall, though she still has some heaviness at times. Continue per POC.    Clinical Presentation  Stable    Clinical Decision Making  Moderate    Rehab Potential  Good    PT Frequency  1x / week    PT Duration  Other (comment)    PT Treatment/Interventions  ADLs/Self Care Home Management;Biofeedback;Electrical Stimulation;Functional mobility training;Therapeutic activities;Therapeutic exercise;Patient/family education;Neuromuscular re-education;Manual techniques;Scar mobilization;Taping    PT Next Visit Plan  teach thoracic  extensions over towel-roll, TP release and PA to thoracic region, review pelvic tilts in supine and mini-marches and multi-direction stepping.     PT Home Exercise Plan  hook-lying pelvic tilts with kegel, soda-can, squatty potty, kegels, Log-roll, sit-to-stand.    Consulted and Agree with Plan of Care  Patient       Patient will benefit from skilled therapeutic intervention  in order to improve the following deficits and impairments:  Increased fascial restricitons, Improper body mechanics, Pain, Decreased coordination, Decreased scar mobility, Increased muscle spasms, Postural dysfunction, Decreased endurance, Decreased strength  Visit Diagnosis: Muscle weakness (generalized)  Abnormal posture  Cystocele with prolapse     Problem List Patient Active Problem List   Diagnosis Date Noted  . Insomnia 08/11/2017  . Anxiety 08/11/2017  . Cystocele, midline 07/09/2017  . Uterine prolapse 07/09/2017  . Special screening for malignant neoplasms, colon   . Benign neoplasm of ascending colon   . Benign neoplasm of transverse colon   . Cough 08/20/2015  . 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 Osteopenia 02/02/2015  . Health care maintenance 08/06/2014  . Stress 02/16/2014  . Hypertension 05/03/2012  . Hypercholesterolemia 05/03/2012  . Hyperglycemia 05/03/2012  . Environmental allergies 05/03/2012   Willa Rough DPT, ATC Willa Rough 09/04/2017, 7:14 PM  [place] MAIN [facility] SERVICES [address] [place], [place], [address] Phone: [phone]   Fax:  [phone]  Name: Donna Lara MRN: [id number] Date of Birth: December 01, 1940

## 2017-09-09 ENCOUNTER — Ambulatory Visit: Payer: Medicare Other

## 2017-09-10 ENCOUNTER — Ambulatory Visit: Payer: Medicare Other

## 2017-09-14 ENCOUNTER — Ambulatory Visit: Payer: Medicare Other | Attending: Urology

## 2017-09-14 DIAGNOSIS — M6281 Muscle weakness (generalized): Secondary | ICD-10-CM | POA: Insufficient documentation

## 2017-09-14 DIAGNOSIS — N814 Uterovaginal prolapse, unspecified: Secondary | ICD-10-CM | POA: Diagnosis not present

## 2017-09-14 DIAGNOSIS — R293 Abnormal posture: Secondary | ICD-10-CM | POA: Diagnosis not present

## 2017-09-14 NOTE — Therapy (Signed)
[place] MAIN [facility] SERVICES [address] [place], [place], [address] Phone: [phone]   Fax:  [phone]  Physical Therapy Treatment  Patient Details  Name: Donna Lara MRN: [id number] Date of Birth: [birth year] Referring Provider: Zara Council   Encounter Date: 09/14/2017  PT End of Session - 09/14/17 1645    Visit Number  8    Number of Visits  10    Date for PT Re-Evaluation  09/16/17    Authorization Type  Medicare    Authorization - Visit Number  8    Authorization - Number of Visits  10    PT Start Time  0930    PT Stop Time  1040    PT Time Calculation (min)  70 min       Past Medical History:  Diagnosis Date  . Allergy   . Carpal tunnel syndrome 1999  . GERD (gastroesophageal reflux disease)   . Hypercholesterolemia    By last lipid panel  . Hypertension    Borderline. She does not take the 12.5 HCTZ daily    Past Surgical History:  Procedure Laterality Date  . APPENDECTOMY  1963  . CARPAL TUNNEL RELEASE     right hand  . COLONOSCOPY    . COLONOSCOPY WITH PROPOFOL N/A 12/01/2016   Procedure: COLONOSCOPY WITH PROPOFOL;  Surgeon: Lucilla Lame, MD;  Location: [place];  Service: Gastroenterology;  Laterality: N/A;  . EYE SURGERY     left  . POLYPECTOMY  12/01/2016   Procedure: POLYPECTOMY INTESTINAL;  Surgeon: Lucilla Lame, MD;  Location: [place];  Service: Gastroenterology;;  Transverse colon polyp x 2 Ascending colon polyp x 3  . TONSILLECTOMY      There were no vitals filed for this visit.    Pelvic Floor Physical Therapy Treatment Note  SCREENING  Changes in medications, allergies, or medical history?: no   SUBJECTIVE  Patient reports: She has been doing a lot of up-and-down stairs packing up winter clothes, etc. Has been able to find a mat that she can use for her exercises. She is feeling "pretty good"   Pain update: No pain  Patient Goals: Decrease Prolapse  feeling    OBJECTIVE  Changes in: Posture/Observations:  Forward head and shoulders, hyperlordosis upon entering. Mild improvement with cueing only and greater improvement following treatment with improved ability to recruit deep core following treatment.  Range of Motion/Flexibilty:  Stiffness and decreased PROM through thoracic spine pre-treatment. Decreased stiffness and discomfort following treatment, improved ROM allowing more erect posture. Some increased kyphosis remains.   Abdominal:  Pt. Had difficulty recruiting TA and not holding her breath with TA contraction pre-treatment, improved by ~ 50% following.  INTERVENTIONS THIS SESSION: Manual: performed grade 3-4 PA mobs to length of thoracic spine with broad 2-3 segment contact to begin gently improving mobility oth there spine for improved posture and TA recruitment to decrease stress on PFM and prolapse sensation. Therex: Educated on and practiced extensions over a foam roller, "snow-angel" Pectoralis lengthening on vertical towel roll and supine chin-tucks to decrease hyperkyphosis and improve overall posture to allow for improved PFM function. Reviewed sit-to-stand technique with exhale to reinforce learning and improve timing of breath for improved coordination and support of pelvic organs.  Total time: 70 min.                          PT Education - 09/14/17 1645  Education provided  Yes    Education Details  See Pt. Instructions and Interventions this session    Person(s) Educated  Patient    Methods  Explanation;Demonstration;Tactile cues;Verbal cues;Handout    Comprehension  Verbalized understanding;Returned demonstration;Verbal cues required;Tactile cues required;Need further instruction       PT Short Term Goals - 08/05/17 1249      PT SHORT TERM GOAL #1   Title  Patient will demonstrate functional recruitment of TA with breathing, sit-to-stand, squatting/lifting, and walking to allow for  improved pelvic brace coordination, improved balance, and decreased downward pressure on the pelvic organs.    Time  5    Period  Weeks    Status  On-going    Target Date  08/12/17      PT SHORT TERM GOAL #2   Title  Patient will demonstrate improved sitting and standing posture to demonstrate learning and decrease stress on the pelvic floor with functional activity.    Time  5    Status  On-going    Target Date  08/12/17        PT Long Term Goals - 07/08/17 1331      PT LONG TERM GOAL #1   Title  Patient will report no episodes of urge incontinence over the course of the prior two weeks to demonstrate improved functional ability.    Time  10    Period  Weeks    Status  New    Target Date  09/16/17      PT LONG TERM GOAL #2   Title  Patient will describe feeling of pressure no more than 20% of the time over the course of the past week to demonstrate improved recruitment and strength of the pelvic floor.    Baseline  80%    Time  10    Period  Weeks    Status  New    Target Date  09/16/17      PT LONG TERM GOAL #3   Title  Patient will score at or below 11/300 on the PFDI and 0/300 on the PFIQ to demonstrate a clinically meaningful decrease in disability and distress due to pelvic floor dysfunction.    Baseline  PFDI: 56/300, PFDI: 29/300    Time  10    Period  Weeks    Status  New    Target Date  09/16/17            Plan - 09/14/17 1645    Clinical Impression Statement  Pt. responded well to all interventions today, demonstrating improved thoracic ROM, decreased TTP and greater deep core recruitment and exercise performance following treatment as well as understanding of all education provided. Continue per POC.    Clinical Presentation  Stable    Clinical Decision Making  Moderate    Rehab Potential  Good    PT Frequency  1x / week    PT Duration  Other (comment)    PT Treatment/Interventions  ADLs/Self Care Home Management;Biofeedback;Electrical  Stimulation;Functional mobility training;Therapeutic activities;Therapeutic exercise;Patient/family education;Neuromuscular re-education;Manual techniques;Scar mobilization;Taping    PT Next Visit Plan  check sacral mobility and QL and Psoas, review pelvic tilts in supine and mini-marches and add multi-direction stepping.     PT Home Exercise Plan  hook-lying pelvic tilts with kegel, soda-can, squatty potty, kegels, Log-roll, sit-to-stand, thoracic extensions, snow-angel, chin tucks in supine    Consulted and Agree with Plan of Care  Patient       Patient will benefit from  skilled therapeutic intervention in order to improve the following deficits and impairments:  Increased fascial restricitons, Improper body mechanics, Pain, Decreased coordination, Decreased scar mobility, Increased muscle spasms, Postural dysfunction, Decreased endurance, Decreased strength  Visit Diagnosis: Muscle weakness (generalized)  Abnormal posture  Cystocele with prolapse     Problem List Patient Active Problem List   Diagnosis Date Noted  . Insomnia 08/11/2017  . Anxiety 08/11/2017  . Cystocele, midline 07/09/2017  . Uterine prolapse 07/09/2017  . Special screening for malignant neoplasms, colon   . Benign neoplasm of ascending colon   . Benign neoplasm of transverse colon   . Cough 08/20/2015  . 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 Osteopenia 02/02/2015  . Health care maintenance 08/06/2014  . Stress 02/16/2014  . Hypertension 05/03/2012  . Hypercholesterolemia 05/03/2012  . Hyperglycemia 05/03/2012  . Environmental allergies 05/03/2012   Willa Rough DPT, ATC Willa Rough 09/14/2017, 4:50 PM  [place] MAIN [facility] SERVICES [address] [place], [place], [address] Phone: [phone]   Fax:  [phone]  Name: Donna Lara MRN: [id number] Date of Birth: Jul 22, 1940

## 2017-09-14 NOTE — Patient Instructions (Addendum)
   Place foam roller or towel under your upper back between your shoulder blades. Support your head with your hands, elbows forward, and gently rock back and forth and side to side to improve motion in your back.   Move the foam roller or towel up and down to a few spots in the upper back, repeating the process.     Sit at the end of the foam roller or towel roll and lie back with your spine along the length of it. Bring your arms out to the side, palms up, and take _5__ belly breaths then move arms up closer to ears and do 5 more breaths.  Next, slide arms up overhead doing a "snow angel" motion as you breathe in and down toward your hips as you breathe out. Do this for _5__ belly breaths.    Exhale as you pull your chin in making a "double chin" do 2x10     Sit, feet flat, scoot forward to the edge of the chair. Inhale as you bend forward at hips, begin to exhale just before and while you stand, contracting the glutes, lower tummy muscles and pelvic floor as if stopping urination as you stand up.   * Do this every time you sit or stand! If you catch yourself doing it "wrong, re-set and do it again so it can become habit!

## 2017-09-20 NOTE — Progress Notes (Signed)
09/21/2017 11:09 AM   Donna Lara 06/08/1940 [id number]  Referring provider: [name], [address] [place], [place] [address]  Chief Complaint  Patient presents with  . Vaginal Atrophy    cystocele    HPI: Patient is a 77 year old Caucasian female with a cystocele and vaginal atrophy who presents today for follow-up.  Background history Patient is a 64 -year-old Caucasian female who is referred to [place] by Dr. [name] for bladder prolapse.  She has had an uncomfortable sensation with her bladder for the last several months.  She is not noted urinary frequency, urgency, dysuria, nocturia, incontinence, intermittency, hesitancy, straining to urinate or weak urinary stream.  She just feels her bladder in between her legs and it pinches sometimes.   Patient denies any gross hematuria or suprapubic/flank pain.  Patient denies any fevers, chills, nausea or vomiting.   Her PVR is 14 mL.  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  She does not have a history of urinary tract infections, STI's or injury to the bladder.   She does not have a history of nephrolithiasis, GU surgery or GU trauma.  She is post menopausal.  She admits to constipation.  At her visit 3 months ago, she was started on vaginal estrogen cream, referred for a pessary and referred to PT.  She is using the vaginal cream three times weekly.  She did not want the pessary.  She is enjoying the PT and is finding it beneficial.  She is not having vaginal bleeding or rash.  Patient denies any gross hematuria, dysuria or suprapubic/flank pain.  Patient denies any fevers, chills, nausea or vomiting.   PMH: Past Medical History:  Diagnosis Date  . Allergy   . Carpal tunnel syndrome 1999  . GERD (gastroesophageal reflux disease)   .  Hypercholesterolemia    By last lipid panel  . Hypertension    Borderline. She does not take the 12.5 HCTZ daily    Surgical History: Past Surgical History:  Procedure Laterality Date  . APPENDECTOMY  1963  . CARPAL TUNNEL RELEASE     right hand  . COLONOSCOPY    . COLONOSCOPY WITH PROPOFOL N/A 12/01/2016   Procedure: COLONOSCOPY WITH PROPOFOL;  Surgeon: Lucilla Lame, MD;  Location: [facility];  Service: Gastroenterology;  Laterality: N/A;  . EYE SURGERY     left  . POLYPECTOMY  12/01/2016   Procedure: POLYPECTOMY INTESTINAL;  Surgeon: Lucilla Lame, MD;  Location: [facility];  Service: Gastroenterology;;  Transverse colon polyp x 2 Ascending colon polyp x 3  . TONSILLECTOMY      Home Medications:  Allergies as of 09/21/2017      Reactions   Codeine Sulfate    Insomnia. Out of control.      Medication List        Accurate as of 09/21/17 11:09 AM. Always use your most recent med list.          ALOE VERA PO Take 1 tablet by mouth daily.   clonazePAM 0.5 MG tablet Commonly known as:  KLONOPIN Take 1 tablet (0.5 mg total) by mouth daily as needed for anxiety.   co-enzyme Q-10 30 MG capsule Take 30 mg by mouth 2 (two) times daily.   conjugated estrogens vaginal  cream Commonly known as:  PREMARIN Place 1 Applicatorful vaginally daily. Apply 0.5mg  (pea-sized amount)  just inside the vaginal introitus with a finger-tip on  Monday, Wednesday and Friday nights.   CYANOCOBALAMIN PO Take 1 tablet by mouth daily.   estradiol 0.1 MG/GM vaginal cream Commonly known as:  ESTRACE VAGINAL Apply 0.5mg  (pea-sized amount)  just inside the vaginal introitus with a finger-tip on Monday, Wednesday and Friday nights.   Fish Oil 1000 MG Caps Take 1,000 mg by mouth 2 (two) times daily.   fluticasone 50 MCG/ACT nasal spray Commonly known as:  FLONASE Place 2 sprays into both nostrils daily.   hydrochlorothiazide 12.5 MG tablet Commonly known as:  HYDRODIURIL Take  12.5 mg by mouth as needed (as needed for edema and hypertension).   magnesium 30 MG tablet Take 30 mg by mouth 1 day or 1 dose.   MAGNESIUM CITRATE PO Take 1 tablet by mouth daily.   mometasone 0.1 % lotion Commonly known as:  ELOCON Apply topically daily. For no more than 7 days.   omega-3 acid ethyl esters 1 g capsule Commonly known as:  LOVAZA Take by mouth 3 (three) times daily.   RED YEAST RICE PO Take 2 capsules by mouth daily.   vitamin C 500 MG tablet Commonly known as:  ASCORBIC ACID Take 500 mg by mouth 2 (two) times daily.   Vitamin D3 1000 units Caps Take 1,000 Units by mouth 2 (two) times daily.       Allergies:  Allergies  Allergen Reactions  . Codeine Sulfate     Insomnia. Out of control.    Family History: Family History  Problem Relation Age of Onset  . Diabetes Mother   . Hypertension Brother   . Diabetes Maternal Grandmother   . Asthma Daughter   . Breast cancer Other   . Colon cancer Neg Hx     Social History:  reports that she has never smoked. She has never used smokeless tobacco. She reports that she drinks alcohol. She reports that she does not use drugs.  ROS: UROLOGY Frequent Urination?: No Hard to postpone urination?: No Burning/pain with urination?: No Get up at night to urinate?: No Leakage of urine?: No Urine stream starts and stops?: No Trouble starting stream?: No Do you have to strain to urinate?: No Blood in urine?: No Urinary tract infection?: No Sexually transmitted disease?: No Injury to kidneys or bladder?: No Painful intercourse?: No Weak stream?: No Currently pregnant?: No Vaginal bleeding?: No Last menstrual period?: n  Gastrointestinal Nausea?: No Vomiting?: No Indigestion/heartburn?: No Diarrhea?: No Constipation?: No  Constitutional Fever: No Night sweats?: No Weight loss?: No Fatigue?: No  Skin Skin rash/lesions?: No Itching?: No  Eyes Blurred vision?: No Double vision?:  No  Ears/Nose/Throat Sore throat?: No Sinus problems?: No  Hematologic/Lymphatic Swollen glands?: No Easy bruising?: No  Cardiovascular Leg swelling?: No Chest pain?: No  Respiratory Cough?: No Shortness of breath?: No  Endocrine Excessive thirst?: No  Musculoskeletal Back pain?: No Joint pain?: No  Neurological Headaches?: No Dizziness?: No  Psychologic Depression?: No Anxiety?: No  Physical Exam: BP (!) 146/84 (BP Location: Right Arm, Patient Position: Sitting, Cuff Size: Normal)   Pulse 68   Ht 5\' 2"  (1.575 m)   Wt 138 lb 1.6 oz (62.6 kg)   LMP 04/28/1992   BMI 25.26 kg/m   Constitutional: Well nourished. Alert and oriented, No acute distress. HEENT: [place] AT, moist mucus membranes. Trachea midline, no masses. Cardiovascular: No clubbing, cyanosis, or  edema. Respiratory: Normal respiratory effort, no increased work of breathing. GI: Abdomen is soft, non tender, non distended, no abdominal masses. Liver and spleen not palpable.  No hernias appreciated.  Stool sample for occult testing is not indicated.   GU: No CVA tenderness.  No bladder fullness or masses.  Atrophic external genitalia, normal pubic hair distribution, no lesions.  Normal urethral meatus, no lesions, no prolapse, no discharge.   No urethral masses, tenderness and/or tenderness. No bladder fullness, tenderness or masses. Good estrogenization of vagina mucosa, good estrogen effect, no discharge, no lesions, good pelvic support, Grade II cystocele.  No rectocele noted.  No cervical motion tenderness.  Uterus is freely mobile and non-fixed.  No adnexal/parametria masses or tenderness noted.  Anus and perineum are without rashes or lesions.    Skin: No rashes, bruises or suspicious lesions. Lymph: No cervical or inguinal adenopathy. Neurologic: Grossly intact, no focal deficits, moving all 4 extremities. Psychiatric: Normal mood and affect.   Laboratory Data: Lab Results  Component Value Date   WBC  6.8 05/05/2017   HGB 14.7 05/05/2017   HCT 44.1 05/05/2017   MCV 89.0 05/05/2017   PLT 235.0 05/05/2017    Lab Results  Component Value Date   CREATININE 0.77 09/01/2017    No results found for: PSA  No results found for: TESTOSTERONE  Lab Results  Component Value Date   HGBA1C 5.8 09/01/2017    Lab Results  Component Value Date   TSH 1.63 05/05/2017       Component Value Date/Time   CHOL 207 (H) 09/01/2017 0818   HDL 76.60 09/01/2017 0818   CHOLHDL 3 09/01/2017 0818   VLDL 11.2 09/01/2017 0818   LDLCALC 120 (H) 09/01/2017 0818    Lab Results  Component Value Date   AST 20 09/01/2017   Lab Results  Component Value Date   ALT 15 09/01/2017   No components found for: ALKALINEPHOPHATASE No components found for: BILIRUBINTOTAL  No results found for: ESTRADIOL  Urinalysis No results found for: COLORURINE, APPEARANCEUR, LABSPEC, PHURINE, GLUCOSEU, HGBUR, BILIRUBINUR, KETONESUR, PROTEINUR, UROBILINOGEN, NITRITE, LEUKOCYTESUR  I have reviewed the labs.   Assessment & Plan:    1. Cystocele Finding PT beneficial Did not want a pessary fitting  RTC in 12 months for PVR and symptom recheck   2. Vaginal atrophy Continue the cream three times weekly She will follow up in 12 months for an exam.     Return in about 1 year (around 09/22/2018) for OAB questionnaire, PVR and exam.  These notes generated with voice recognition software. I apologize for typographical errors.  Zara Council, PA-C  [facility] [address] [place], [place] [address] [phone]

## 2017-09-21 ENCOUNTER — Encounter: Payer: Self-pay | Admitting: Urology

## 2017-09-21 ENCOUNTER — Other Ambulatory Visit: Payer: Self-pay

## 2017-09-21 ENCOUNTER — Ambulatory Visit (INDEPENDENT_AMBULATORY_CARE_PROVIDER_SITE_OTHER): Payer: Medicare Other | Admitting: Urology

## 2017-09-21 VITALS — BP 146/84 | HR 68 | Ht 62.0 in | Wt 138.1 lb

## 2017-09-21 DIAGNOSIS — N952 Postmenopausal atrophic vaginitis: Secondary | ICD-10-CM | POA: Diagnosis not present

## 2017-09-21 DIAGNOSIS — N8111 Cystocele, midline: Secondary | ICD-10-CM | POA: Diagnosis not present

## 2017-10-12 ENCOUNTER — Ambulatory Visit: Payer: Medicare Other | Admitting: Obstetrics & Gynecology

## 2017-10-12 ENCOUNTER — Encounter: Payer: Self-pay | Admitting: Obstetrics & Gynecology

## 2017-10-12 ENCOUNTER — Ambulatory Visit (INDEPENDENT_AMBULATORY_CARE_PROVIDER_SITE_OTHER): Payer: Medicare Other | Admitting: Obstetrics & Gynecology

## 2017-10-12 VITALS — BP 130/80 | Ht 62.0 in | Wt 140.0 lb

## 2017-10-12 DIAGNOSIS — N812 Incomplete uterovaginal prolapse: Secondary | ICD-10-CM | POA: Diagnosis not present

## 2017-10-12 NOTE — Progress Notes (Signed)
  History of Present Illness:  Donna Lara is a 77 y.o. who has been having problems with dropped bladder and uterus for this year, over the last 3 mos says sx's are stable and she does regular Kegels and pelvic floor rehab as instructed by PT with success. She was started on Premarin vag cream 3x weekly approximately 4 months ago. Since that time, she states that her symptoms are improving.  PMHx: She  has a past medical history of Allergy, Carpal tunnel syndrome (1999), GERD (gastroesophageal reflux disease), Hypercholesterolemia, and Hypertension. Also,  has a past surgical history that includes Appendectomy (1963); Tonsillectomy; Eye surgery; Carpal tunnel release; Colonoscopy; Colonoscopy with propofol (N/A, 12/01/2016); and Polypectomy (12/01/2016)., family history includes Asthma in her daughter; Breast cancer in her other; Diabetes in her maternal grandmother and mother; Hypertension in her brother.,  reports that she has never smoked. She has never used smokeless tobacco. She reports that she drinks alcohol. She reports that she does not use drugs. No outpatient medications have been marked as taking for the 10/12/17 encounter (Office Visit) with Gae Dry, MD.   Current Facility-Administered Medications for the 10/12/17 encounter (Office Visit) with Gae Dry, MD  Medication  . methylPREDNISolone acetate (DEPO-MEDROL) injection 40 mg  . Also, is allergic to codeine sulfate..  Review of Systems  All other systems reviewed and are negative.  Physical Exam:  BP 130/80   Ht 5\' 2"  (1.575 m)   Wt 140 lb (63.5 kg)   LMP 04/28/1992   BMI 25.61 kg/m  Body mass index is 25.61 kg/m. Constitutional: Well nourished, well developed female in no acute distress.  Abdomen: diffusely non tender to palpation, non distended, and no masses, hernias Neuro: Grossly intact Psych:  Normal mood and affect.    Assessment:  Problem List Items Addressed This Visit      Genitourinary   Cystocele  with incomplete uterovaginal prolapse - Primary    Plan: She will undergo no change in her medical therapy.  She will consider returnig to PT for update and optimization of her pelvic floor rehab therapy. Options of pessary and surgery discussed in detail today.  Pt will cont meds and PT.  May consider intervention later this year esp if sx's worsen She was amenable to this plan and we will see her back for annual/PRN.  A total of 15 minutes were spent face-to-face with the patient during this encounter and over half of that time dealt with counseling and coordination of care.  Barnett Applebaum, MD, [facility], [facility] 10/12/2017  1:43 PM

## 2017-10-28 DIAGNOSIS — L821 Other seborrheic keratosis: Secondary | ICD-10-CM | POA: Diagnosis not present

## 2017-10-28 DIAGNOSIS — D1801 Hemangioma of skin and subcutaneous tissue: Secondary | ICD-10-CM | POA: Diagnosis not present

## 2017-10-28 DIAGNOSIS — D485 Neoplasm of uncertain behavior of skin: Secondary | ICD-10-CM | POA: Diagnosis not present

## 2017-10-28 DIAGNOSIS — C44529 Squamous cell carcinoma of skin of other part of trunk: Secondary | ICD-10-CM | POA: Diagnosis not present

## 2017-10-28 DIAGNOSIS — B351 Tinea unguium: Secondary | ICD-10-CM | POA: Diagnosis not present

## 2017-11-10 ENCOUNTER — Ambulatory Visit (INDEPENDENT_AMBULATORY_CARE_PROVIDER_SITE_OTHER): Payer: Medicare Other

## 2017-11-10 VITALS — BP 126/74 | HR 78 | Temp 98.4°F | Resp 14 | Ht 61.1 in | Wt 137.8 lb

## 2017-11-10 DIAGNOSIS — Z0000 Encounter for general adult medical examination without abnormal findings: Secondary | ICD-10-CM | POA: Diagnosis not present

## 2017-11-10 NOTE — Progress Notes (Addendum)
Subjective:   Donna Lara is a 77 y.o. female who presents for Medicare Annual (Subsequent) preventive examination.  Review of Systems:  No ROS.  Medicare Wellness Visit. Additional risk factors are reflected in the social history. Cardiac Risk Factors include: advanced age (>31men, >28 women);hypertension     Objective:     Vitals: BP 126/74 (BP Location: Left Arm, Patient Position: Sitting, Cuff Size: Normal)   Pulse 78   Temp 98.4 F (36.9 C) (Oral)   Resp 14   Ht 5' 1.1" (1.552 m)   Wt 137 lb 12.8 oz (62.5 kg)   LMP 04/28/1992   SpO2 98%   BMI 25.95 kg/m   Body mass index is 25.95 kg/m.  Advanced Directives 11/10/2017 07/08/2017 12/01/2016 11/07/2016 11/07/2015 11/02/2014  Does Patient Have a Medical Advance Directive? No No No No No No  Would patient like information on creating a medical advance directive? No - Patient declined Yes (MAU/Ambulatory/Procedural Areas - Information given) Yes (MAU/Ambulatory/Procedural Areas - Information given) Yes (MAU/Ambulatory/Procedural Areas - Information given) No - patient declined information Yes - Educational materials given    Tobacco Social History   Tobacco Use  Smoking Status Never Smoker  Smokeless Tobacco Never Used     Counseling given: Not Answered   Clinical Intake:  Pre-visit preparation completed: Yes  Pain : No/denies pain     Nutritional Status: BMI 25 -29 Overweight Diabetes: No  How often do you need to have someone help you when you read instructions, pamphlets, or other written materials from your doctor or pharmacy?: 1 - Never  Interpreter Needed?: No     Past Medical History:  Diagnosis Date  . Allergy   . Carpal tunnel syndrome 1999  . GERD (gastroesophageal reflux disease)   . Hypercholesterolemia    By last lipid panel  . Hypertension    Borderline. She does not take the 12.5 HCTZ daily   Past Surgical History:  Procedure Laterality Date  . APPENDECTOMY  1963  . CARPAL TUNNEL RELEASE       right hand  . COLONOSCOPY    . COLONOSCOPY WITH PROPOFOL N/A 12/01/2016   Procedure: COLONOSCOPY WITH PROPOFOL;  Surgeon: Lucilla Lame, MD;  Location: [facility];  Service: Gastroenterology;  Laterality: N/A;  . EYE SURGERY     left  . POLYPECTOMY  12/01/2016   Procedure: POLYPECTOMY INTESTINAL;  Surgeon: Lucilla Lame, MD;  Location: [facility];  Service: Gastroenterology;;  Transverse colon polyp x 2 Ascending colon polyp x 3  . TONSILLECTOMY     Family History  Problem Relation Age of Onset  . Diabetes Mother   . Hypertension Brother   . Diabetes Maternal Grandmother   . Asthma Daughter   . Breast cancer Other   . Colon cancer Neg Hx    Social History   Socioeconomic History  . Marital status: Married    Spouse name: Not on file  . Number of children: 3  . Years of education: Not on file  . Highest education level: Not on file  Occupational History    Employer: OTHER  Social Needs  . Financial resource strain: Not hard at all  . Food insecurity:    Worry: Never true    Inability: Never true  . Transportation needs:    Medical: No    Non-medical: No  Tobacco Use  . Smoking status: Never Smoker  . Smokeless tobacco: Never Used  Substance and Sexual Activity  . Alcohol use: Yes  Alcohol/week: 0.0 oz    Comment: 2-3 drinks/month  . Drug use: No  . Sexual activity: Never  Lifestyle  . Physical activity:    Days per week: 4 days    Minutes per session: 90 min  . Stress: Not at all  Relationships  . Social connections:    Talks on phone: Not on file    Gets together: Not on file    Attends religious service: Not on file    Active member of club or organization: Not on file    Attends meetings of clubs or organizations: Not on file    Relationship status: Not on file  Other Topics Concern  . Not on file  Social History Narrative   Recently widowed. Her husband died of a heart attack in 05-23-15    Outpatient Encounter Medications as  of 11/10/2017  Medication Sig  . ALOE VERA PO Take 1 tablet by mouth daily.   . Cholecalciferol (VITAMIN D3) 1000 UNITS CAPS Take 1,000 Units by mouth 2 (two) times daily.   . clonazePAM (KLONOPIN) 0.5 MG tablet Take 1 tablet (0.5 mg total) by mouth daily as needed for anxiety.  Marland Kitchen co-enzyme Q-10 30 MG capsule Take 30 mg by mouth 2 (two) times daily.  Marland Kitchen conjugated estrogens (PREMARIN) vaginal cream Place 1 Applicatorful vaginally daily. Apply 0.5mg  (pea-sized amount)  just inside the vaginal introitus with a finger-tip on  Monday, Wednesday and Friday nights.  . CYANOCOBALAMIN PO Take 1 tablet by mouth daily.   Marland Kitchen estradiol (ESTRACE VAGINAL) 0.1 MG/GM vaginal cream Apply 0.5mg  (pea-sized amount)  just inside the vaginal introitus with a finger-tip on Monday, Wednesday and Friday nights.  . fluticasone (FLONASE) 50 MCG/ACT nasal spray Place 2 sprays into both nostrils daily.  . hydrochlorothiazide (HYDRODIURIL) 12.5 MG tablet Take 12.5 mg by mouth as needed (as needed for edema and hypertension).   . magnesium 30 MG tablet Take 30 mg by mouth 1 day or 1 dose.  Marland Kitchen MAGNESIUM CITRATE PO Take 1 tablet by mouth daily.   . mometasone (ELOCON) 0.1 % lotion Apply topically daily. For no more than 7 days.  Marland Kitchen omega-3 acid ethyl esters (LOVAZA) 1 g capsule Take by mouth 3 (three) times daily.  . Omega-3 Fatty Acids (FISH OIL) 1000 MG CAPS Take 1,000 mg by mouth 2 (two) times daily.   . Red Yeast Rice Extract (RED YEAST RICE PO) Take 2 capsules by mouth daily.  . vitamin C (ASCORBIC ACID) 500 MG tablet Take 500 mg by mouth 2 (two) times daily.   Facility-Administered Encounter Medications as of 11/10/2017  Medication  . methylPREDNISolone acetate (DEPO-MEDROL) injection 40 mg    Activities of Daily Living In your present state of health, do you have any difficulty performing the following activities: 11/10/2017 12/01/2016  Hearing? N N  Vision? N N  Difficulty concentrating or making decisions? N N  Walking or  climbing stairs? N N  Dressing or bathing? N N  Doing errands, shopping? N -  Preparing Food and eating ? N -  Using the Toilet? N -  In the past six months, have you accidently leaked urine? N -  Do you have problems with loss of bowel control? N -  Managing your Medications? N -  Managing your Finances? N -  Housekeeping or managing your Housekeeping? N -  Some recent data might be hidden    Patient Care Team: [name], MD as PCP - General (Internal Medicine)  Assessment:   This is a routine wellness examination for Arianna.  The goal of the wellness visit is to assist the patient how to close the gaps in care and create a preventative care plan for the patient.   Taking calcium VIT D as appropriate/Osteoporosis risk reviewed.    Safety issues reviewed; Smoke and carbon monoxide detectors in the home. Firearms locked in a safe within the home. Wears seatbelts when driving or riding with others. No violence in the home.  They do not have excessive sun exposure.  Discussed the need for sun protection: hats, long sleeves and the use of sunscreen if there is significant sun exposure.  Patient is alert, normal appearance, oriented to person/place/and time. Correctly identified the president of the [place] and recalls of 3/3 words. Performs simple calculations and can read correct time from watch face. Displays appropriate judgement.  No new identified risk were noted.  No failures at ADL's or IADL's.    BMI- discussed the importance of a healthy diet, water intake and the benefits of aerobic exercise.She is very active around the home.  Walks 4 days, 90 minutes for exercise. She tries to have a healthy diet and adequate water intake.   Dental- UTD. Exam completed within the last 12 months.   HTN-followed by pcp. She has logged blood pressure readings taken at home inconsistently over the last couple of months; placed in provider box.  Denies any head aches, blurred vision, chest  pain, dizziness. Reading today wnl.   Sleep patterns- Sleeps without issues.   TDAP vaccine deferred per patient preference.  Follow up with insurance.  Educational material provided.  Reports seen by Karle Barr with [facility] for abnormal tissue determined to be squamous cell carcinoma; follow up is 12/09/17. EMMI handed per her request. Encouraged to follow up with pcp as needed; 6 month follow up scheduled in October.   Exercise Activities and Dietary recommendations Current Exercise Habits: Structured exercise class, Type of exercise: strength training/weights;walking, Time (Minutes): > 60(90 minutes), Frequency (Times/Week): 4, Weekly Exercise (Minutes/Week): 0, Intensity: Moderate  Goals      Patient Stated   . Maintain weight 135lb-140lb (pt-stated)     Stay active Healthy diet (low carb, low cholesterol) Monitor sugar intake         Fall Risk Fall Risk  11/10/2017 08/11/2017 11/07/2016 03/14/2016 12/03/2015  Falls in the past year? No No Yes No No  Comment - - - - Emmi Telephone Survey: data to providers prior to load  Number falls in past yr: - - 1 - -  Injury with Fall? - - No - -  Follow up - - Falls prevention discussed;Education provided - -  Comment - - Missed a step when walking - -    Depression Screen PHQ 2/9 Scores 11/10/2017 08/11/2017 11/07/2016 03/14/2016  PHQ - 2 Score 0 0 0 0  PHQ- 9 Score - - 0 -     Cognitive Function MMSE - Mini Mental State Exam 11/10/2017 11/07/2016 11/07/2015 11/02/2014  Orientation to time 5 5 5 5   Orientation to Place 5 5 5 5   Registration 3 3 3 3   Attention/ Calculation 5 5 5 5   Recall 3 3 3 3   Language- name 2 objects 2 2 2 2   Language- repeat 1 1 1 1   Language- follow 3 step command 3 3 3 3   Language- read & follow direction 1 1 1 1   Write a sentence 1 1 1 1   Copy design 1  1 1 1   Total score 30 30 30 30         Immunization History  Administered Date(s) Administered  . Pneumococcal Conjugate-13 11/07/2015  .  Pneumococcal Polysaccharide-23 04/09/2017   Screening Tests Health Maintenance  Topic Date Due  . TETANUS/TDAP  02/10/1960  . INFLUENZA VACCINE  11/05/2017  . MAMMOGRAM  06/10/2019  . COLONOSCOPY  12/01/2021  . DEXA SCAN  Completed  . PNA vac Low Risk Adult  Completed      Plan:   End of life planning; Advanced aging; Advanced directives discussed.  No HCPOA/Living Will.  Additional information declined at this time.  I have personally reviewed and noted the following in the patient's chart:   . Medical and social history . Use of alcohol, tobacco or illicit drugs  . Current medications and supplements . Functional ability and status . Nutritional status . Physical activity . Advanced directives . List of other physicians . Hospitalizations, surgeries, and ER visits in previous 12 months . Vitals . Screenings to include cognitive, depression, and falls . Referrals and appointments  In addition, I have reviewed and discussed with patient certain preventive protocols, quality metrics, and best practice recommendations. A written personalized care plan for preventive services as well as general preventive health recommendations were provided to patient.     OBrien-Blaney, Shannia Jacuinde L, LPN  06/06/9516   Reviewed above information.  Agree with assessment and plan.  Reviewed blood pressure readings.  Will have her come in earlier to f/u on her blood pressure.    Dr Nicki Reaper

## 2017-11-10 NOTE — Patient Instructions (Addendum)
  Donna Lara , Thank you for taking time to come for your Medicare Wellness Visit. I appreciate your ongoing commitment to your health goals. Please review the following plan we discussed and let me know if I can assist you in the future.   Keep routine maintenance appointments.  Follow up with your doctor as needed.   These are the goals we discussed: Goals      Patient Stated   . Maintain weight 135lb-140lb (pt-stated)     Stay active Healthy diet (low carb, low cholesterol) Monitor sugar intake         This is a list of the screening recommended for you and due dates:  Health Maintenance  Topic Date Due  . Tetanus Vaccine  02/10/1962  . Flu Shot  11/05/2017  . Mammogram  06/10/2019  . Colon Cancer Screening  12/01/2021  . DEXA scan (bone density measurement)  Completed  . Pneumonia vaccines  Completed

## 2017-11-11 ENCOUNTER — Telehealth: Payer: Self-pay

## 2017-11-11 NOTE — Telephone Encounter (Signed)
Patient dropped off blood pressure readings. I have placed them in your folder for review

## 2017-11-11 NOTE — Telephone Encounter (Signed)
Pt scheduled  

## 2017-11-11 NOTE — Telephone Encounter (Signed)
Reviewed blood pressure readings.  It appears her blood pressure is a little higher than I would like to see it stay.  I would like to see her and recheck her pressure and discuss treatment options.  I can see her 2:30 11/20/17.

## 2017-11-20 ENCOUNTER — Ambulatory Visit: Payer: Federal, State, Local not specified - PPO | Admitting: Internal Medicine

## 2017-12-09 DIAGNOSIS — C44529 Squamous cell carcinoma of skin of other part of trunk: Secondary | ICD-10-CM | POA: Diagnosis not present

## 2018-01-18 ENCOUNTER — Encounter: Payer: Self-pay | Admitting: Internal Medicine

## 2018-01-18 ENCOUNTER — Ambulatory Visit (INDEPENDENT_AMBULATORY_CARE_PROVIDER_SITE_OTHER): Payer: Medicare Other | Admitting: Internal Medicine

## 2018-01-18 VITALS — BP 154/80 | HR 71 | Temp 98.6°F | Ht 61.0 in | Wt 140.4 lb

## 2018-01-18 DIAGNOSIS — Z23 Encounter for immunization: Secondary | ICD-10-CM | POA: Diagnosis not present

## 2018-01-18 DIAGNOSIS — E7800 Pure hypercholesterolemia, unspecified: Secondary | ICD-10-CM

## 2018-01-18 DIAGNOSIS — N8111 Cystocele, midline: Secondary | ICD-10-CM | POA: Diagnosis not present

## 2018-01-18 DIAGNOSIS — I10 Essential (primary) hypertension: Secondary | ICD-10-CM

## 2018-01-18 DIAGNOSIS — R739 Hyperglycemia, unspecified: Secondary | ICD-10-CM

## 2018-01-18 DIAGNOSIS — Z9109 Other allergy status, other than to drugs and biological substances: Secondary | ICD-10-CM

## 2018-01-18 DIAGNOSIS — S90423A Blister (nonthermal), unspecified great toe, initial encounter: Secondary | ICD-10-CM | POA: Diagnosis not present

## 2018-01-18 MED ORDER — LISINOPRIL 5 MG PO TABS: 5.0000 mg | ORAL_TABLET | Freq: Every day | ORAL | 1 refills | Status: DC

## 2018-01-18 NOTE — Progress Notes (Signed)
[name], female   DOB: 1940/08/03, 77 y.o.   MRN: [id number]   Subjective:    [name], female    DOB: 1940/07/10, 77 y.o.   MRN: [id number]  HPI  Patient here for a scheduled follow up.  She reports she is doing relatively well.  Trying to stay active.  No chest pain.  No sob.  No acid reflux.  No abdominal pain.  Bowels moving.  Handling stress.  Feels her blood pressure was up today because of doing a lot of errands, etc prior to coming into the office. Some discomfort in her right posterior shoulder.  States must have slept on it wrong.  Worse with certain movements.  Better.  Took two ibuprofen today - helped.  Wore cowboy boots this weekend.  Two blisters on her great toes.  No infection.  States her blood pressure has been averaging upper 128N- 867E systolic range.     Past Medical History:  Diagnosis Date  . Allergy   . Carpal tunnel syndrome 1999  . GERD (gastroesophageal reflux disease)   . Hypercholesterolemia    By last lipid panel  . Hypertension    Borderline. She does not take the 12.5 HCTZ daily   Past Surgical History:  Procedure Laterality Date  . APPENDECTOMY  1963  . CARPAL TUNNEL RELEASE     right hand  . COLONOSCOPY    . COLONOSCOPY WITH PROPOFOL N/A 12/01/2016   Procedure: COLONOSCOPY WITH PROPOFOL;  Surgeon: Lucilla Lame, MD;  Location: [facility];  Service: Gastroenterology;  Laterality: N/A;  . EYE SURGERY     left  . POLYPECTOMY  12/01/2016   Procedure: POLYPECTOMY INTESTINAL;  Surgeon: Lucilla Lame, MD;  Location: [facility];  Service: Gastroenterology;;  Transverse colon polyp x 2 Ascending colon polyp x 3  . TONSILLECTOMY     Family History  Problem Relation Age of Onset  . Diabetes Mother   . Hypertension Brother   . Diabetes Maternal Grandmother   . Asthma Daughter   . Breast cancer Other   . Colon cancer Neg Hx    Social History   Socioeconomic History  . Marital status: Married    Spouse name:  Not on file  . Number of children: 3  . Years of education: Not on file  . Highest education level: Not on file  Occupational History    Employer: OTHER  Social Needs  . Financial resource strain: Not hard at all  . Food insecurity:    Worry: Never true    Inability: Never true  . Transportation needs:    Medical: No    Non-medical: No  Tobacco Use  . Smoking status: Never Smoker  . Smokeless tobacco: Never Used  Substance and Sexual Activity  . Alcohol use: Yes    Alcohol/week: 0.0 standard drinks    Comment: 2-3 drinks/month  . Drug use: No  . Sexual activity: Never  Lifestyle  . Physical activity:    Days per week: 4 days    Minutes per session: 90 min  . Stress: Not at all  Relationships  . Social connections:    Talks on phone: Not on file    Gets together: Not on file    Attends religious service: Not on file    Active member of club or organization: Not on file    Attends meetings of clubs or organizations: Not on file    Relationship status: Not on file  Other Topics Concern  . Not on file  Social History Narrative   Recently widowed. Her husband died of a heart attack in April 27, 2015    Outpatient Encounter Medications as of 01/18/2018  Medication Sig  . ALOE VERA PO Take 1 tablet by mouth daily.   . Cholecalciferol (VITAMIN D3) 1000 UNITS CAPS Take 1,000 Units by mouth 2 (two) times daily.   . clonazePAM (KLONOPIN) 0.5 MG tablet Take 1 tablet (0.5 mg total) by mouth daily as needed for anxiety.  Marland Kitchen co-enzyme Q-10 30 MG capsule Take 30 mg by mouth 2 (two) times daily.  Marland Kitchen conjugated estrogens (PREMARIN) vaginal cream Place 1 Applicatorful vaginally daily. Apply 0.71m (pea-sized amount)  just inside the vaginal introitus with a finger-tip on  Monday, Wednesday and Friday nights.  . CYANOCOBALAMIN PO Take 1 tablet by mouth daily.   .Marland Kitchenestradiol (ESTRACE VAGINAL) 0.1 MG/GM vaginal cream Apply 0.560m(pea-sized amount)  just inside the vaginal introitus with a  finger-tip on Monday, Wednesday and Friday nights.  . fluticasone (FLONASE) 50 MCG/ACT nasal spray Place 2 sprays into both nostrils daily.  . magnesium 30 MG tablet Take 30 mg by mouth 1 day or 1 dose.  . Marland KitchenAGNESIUM CITRATE PO Take 1 tablet by mouth daily.   . mometasone (ELOCON) 0.1 % lotion Apply topically daily. For no more than 7 days.  . Marland Kitchenmega-3 acid ethyl esters (LOVAZA) 1 g capsule Take by mouth 3 (three) times daily.  . Omega-3 Fatty Acids (FISH OIL) 1000 MG CAPS Take 1,000 mg by mouth 2 (two) times daily.   . Red Yeast Rice Extract (RED YEAST RICE PO) Take 2 capsules by mouth daily.  . vitamin C (ASCORBIC ACID) 500 MG tablet Take 500 mg by mouth 2 (two) times daily.  . [DISCONTINUED] hydrochlorothiazide (HYDRODIURIL) 12.5 MG tablet Take 12.5 mg by mouth as needed (as needed for edema and hypertension).   . Marland Kitchenisinopril (PRINIVIL,ZESTRIL) 5 MG tablet Take 1 tablet (5 mg total) by mouth daily.   Facility-Administered Encounter Medications as of 01/18/2018  Medication  . methylPREDNISolone acetate (DEPO-MEDROL) injection 40 mg    Review of Systems  Constitutional: Negative for appetite change and unexpected weight change.  HENT: Negative for sinus pressure.   Respiratory: Negative for cough, chest tightness and shortness of breath.   Cardiovascular: Negative for chest pain, palpitations and leg swelling.  Gastrointestinal: Negative for abdominal pain, diarrhea, nausea and vomiting.  Genitourinary: Negative for difficulty urinating and dysuria.  Musculoskeletal: Negative for joint swelling and myalgias.  Skin: Negative for color change and rash.  Neurological: Negative for dizziness, light-headedness and headaches.  Psychiatric/Behavioral: Negative for agitation and dysphoric mood.       Objective:    Physical Exam  Constitutional: She appears well-developed and well-nourished. No distress.  HENT:  Nose: Nose normal.  Mouth/Throat: Oropharynx is clear and moist.  Neck: Neck  supple. No thyromegaly present.  Cardiovascular: Normal rate and regular rhythm.  Pulmonary/Chest: Breath sounds normal. No respiratory distress. She has no wheezes.  Abdominal: Soft. Bowel sounds are normal. There is no tenderness.  Musculoskeletal: She exhibits no edema or tenderness.  Lymphadenopathy:    She has no cervical adenopathy.  Skin: No rash noted. No erythema.  Psychiatric: She has a normal mood and affect. Her behavior is normal.    BP (!) 154/80   Pulse 71   Temp 98.6 F (37 C) (Oral)   Ht _0  (1.549 m)   Wt 140 lb 6.4 oz (63.7  kg)   LMP 04/28/1992   SpO2 96%   BMI 26.53 kg/m  Wt Readings from Last 3 Encounters:  01/18/18 140 lb 6.4 oz (63.7 kg)  11/10/17 137 lb 12.8 oz (62.5 kg)  10/12/17 140 lb (63.5 kg)     Lab Results  Component Value Date   WBC 6.8 05/05/2017   HGB 14.7 05/05/2017   HCT 44.1 05/05/2017   PLT 235.0 05/05/2017   GLUCOSE 97 09/01/2017   CHOL 207 (H) 09/01/2017   TRIG 56.0 09/01/2017   HDL 76.60 09/01/2017   LDLCALC 120 (H) 09/01/2017   ALT 15 09/01/2017   AST 20 09/01/2017   NA 139 09/01/2017   K 4.6 09/01/2017   CL 104 09/01/2017   CREATININE 0.77 09/01/2017   BUN 21 09/01/2017   CO2 28 09/01/2017   TSH 1.63 05/05/2017   HGBA1C 5.8 09/01/2017    Mm Screening Breast Tomo Bilateral  Result Date: 06/09/2017 CLINICAL DATA:  Screening. EXAM: DIGITAL SCREENING BILATERAL MAMMOGRAM WITH TOMO AND CAD COMPARISON:  Previous exam(s). ACR Breast Density Category b: There are scattered areas of fibroglandular density. FINDINGS: There are no findings suspicious for malignancy. Images were processed with CAD. IMPRESSION: No mammographic evidence of malignancy. A result letter of this screening mammogram will be mailed directly to the patient. RECOMMENDATION: Screening mammogram in one year. (Code:SM-B-01Y) BI-RADS CATEGORY  1: Negative. Electronically Signed   By: Everlean Alstrom M.D.   On: 06/09/2017 13:35       Assessment & Plan:    Problem List Items Addressed This Visit    Cystocele, midline    Saw Dr Kenton Kingfisher.  Also saw urology.  Contemplating pessary.  Follow.        Environmental allergies    Controlled.        Hypercholesterolemia    Low cholesterol diet and exercise.  Follow lipid panel.  Off crestor.       Relevant Medications   lisinopril (PRINIVIL,ZESTRIL) 5 MG tablet   Other Relevant Orders   Hepatic function panel   Lipid panel   Hyperglycemia    Low carb diet and exercise.  Follow met b and a1c.        Relevant Orders   Hemoglobin A1c   Hypertension    Blood pressure elevated.  Start lisinopril 53m q day.  Check metabolic panel in 105-67days.        Relevant Medications   lisinopril (PRINIVIL,ZESTRIL) 5 MG tablet   Other Relevant Orders   Basic metabolic panel    Other Visit Diagnoses    Blister of great toe, unspecified laterality, initial encounter    -  Primary   Both toes.  No evidence of infection.  Follow.     Encounter for immunization       Relevant Orders   Flu vaccine HIGH DOSE PF (Completed)       CEinar Pheasant MD

## 2018-01-18 NOTE — Assessment & Plan Note (Signed)
Low carb diet and exercise.  Follow met b and a1c.   

## 2018-01-18 NOTE — Progress Notes (Signed)
Pre visit review using our clinic review tool, if applicable. No additional management support is needed unless otherwise documented below in the visit note. 

## 2018-01-18 NOTE — Assessment & Plan Note (Signed)
Low cholesterol diet and exercise.  Follow lipid panel.  Off crestor.   

## 2018-01-18 NOTE — Assessment & Plan Note (Signed)
Controlled.  

## 2018-01-18 NOTE — Assessment & Plan Note (Signed)
Blood pressure elevated.  Start lisinopril 5mg  q day.  Check metabolic panel in 98-28 days.

## 2018-01-18 NOTE — Assessment & Plan Note (Signed)
Saw Dr Kenton Kingfisher.  Also saw urology.  Contemplating pessary.  Follow.

## 2018-01-27 ENCOUNTER — Encounter: Payer: Self-pay | Admitting: Obstetrics & Gynecology

## 2018-01-27 ENCOUNTER — Ambulatory Visit (INDEPENDENT_AMBULATORY_CARE_PROVIDER_SITE_OTHER): Payer: Medicare Other | Admitting: Obstetrics & Gynecology

## 2018-01-27 VITALS — BP 140/80 | Ht 62.0 in | Wt 140.0 lb

## 2018-01-27 DIAGNOSIS — N8111 Cystocele, midline: Secondary | ICD-10-CM | POA: Diagnosis not present

## 2018-01-27 DIAGNOSIS — N814 Uterovaginal prolapse, unspecified: Secondary | ICD-10-CM

## 2018-01-27 NOTE — Patient Instructions (Signed)

## 2018-01-27 NOTE — Progress Notes (Signed)
Cystocele/Rectocele Patient complains of a cystocele. Problem started several months ago. Symptoms include: prolapse of tissue with straining, discomfort: moderate and difficulty with intitiating stream along w hesitancy.  Bulge in vagina.  Desires treatement now, has tried PT, Kegels, and vag est in past.. Symptoms have gradually worsened.   PMHx: She  has a past medical history of Allergy, Carpal tunnel syndrome (1999), GERD (gastroesophageal reflux disease), Hypercholesterolemia, and Hypertension. Also,  has a past surgical history that includes Appendectomy (1963); Tonsillectomy; Eye surgery; Carpal tunnel release; Colonoscopy; Colonoscopy with propofol (N/A, 12/01/2016); and Polypectomy (12/01/2016)., family history includes Asthma in her daughter; Breast cancer in her other; Diabetes in her maternal grandmother and mother; Hypertension in her brother.,  reports that she has never smoked. She has never used smokeless tobacco. She reports that she drinks alcohol. She reports that she does not use drugs.  She has a current medication list which includes the following prescription(s): aloe vera, vitamin d3, clonazepam, co-enzyme q-10, conjugated estrogens, cyanocobalamin, estradiol, fluticasone, lisinopril, magnesium, magnesium citrate, mometasone, omega-3 acid ethyl esters, fish oil, red yeast rice extract, and vitamin c, and the following Facility-Administered Medications: methylprednisolone acetate. Also, is allergic to codeine sulfate.  Review of Systems  Constitutional: Negative for chills, fever and malaise/fatigue.  HENT: Negative for congestion, sinus pain and sore throat.   Eyes: Negative for blurred vision and pain.  Respiratory: Negative for cough and wheezing.   Cardiovascular: Negative for chest pain and leg swelling.  Gastrointestinal: Negative for abdominal pain, constipation, diarrhea, heartburn, nausea and vomiting.  Genitourinary: Negative for dysuria, frequency, hematuria and  urgency.  Musculoskeletal: Negative for back pain, joint pain, myalgias and neck pain.  Skin: Negative for itching and rash.  Neurological: Negative for dizziness, tremors and weakness.  Endo/Heme/Allergies: Does not bruise/bleed easily.  Psychiatric/Behavioral: Negative for depression. The patient is not nervous/anxious and does not have insomnia.     Objective: BP 140/80   Ht 5\' 2"  (1.575 m)   Wt 140 lb (63.5 kg)   LMP 04/28/1992   BMI 25.61 kg/m  Physical Exam  Constitutional: She is oriented to person, place, and time. She appears well-developed and well-nourished. No distress.  Genitourinary: Vagina normal and uterus normal. Pelvic exam was performed with patient supine. There is no rash, tenderness or lesion on the right labia. There is no rash, tenderness or lesion on the left labia. No erythema or bleeding in the vagina. Right adnexum does not display mass and does not display tenderness. Left adnexum does not display mass and does not display tenderness. Cervix does not exhibit motion tenderness, discharge, polyp or nabothian cyst.   Uterus is mobile and midaxial. Uterus is not enlarged or exhibiting a mass.  Genitourinary Comments: Gr 3 uterine prolapse Gr 2 rectocele and cystocele, esp towards vag apex Min atrophy  HENT:  Head: Normocephalic and atraumatic.  Nose: Nose normal.  Mouth/Throat: Oropharynx is clear and moist.  Abdominal: Soft. She exhibits no distension. There is no tenderness.  Musculoskeletal: Normal range of motion.  Neurological: She is alert and oriented to person, place, and time. No cranial nerve deficit.  Skin: Skin is warm and dry.  Psychiatric: She has a normal mood and affect.   ASSESSMENT/PLAN:    Problem List Items Addressed This Visit      Genitourinary   Cystocele, midline - Primary   Uterine prolapse       Rectocele as well              Symptoms for these  have been worsening; desires treatment     Pessary vs surgery discussed as  options.    Pessary Fitting Patient presents for a pessary fitting. She desires a pessary as her means of controlling her symptoms of prolapse and/or urinary incontinence. She understands the care needed for a pessary and desires to proceed. Alternative treatment options have been discussed at length and the patient voices an understanding of each option.   PROCEDURE: The patient was placed in dorsal lithotomy position. Examination confirmed prolapse. A 3 ring type pessary was fitted without difficulty. The patient subsequently ambulated, voided and performed valsalva maneuvers without dislodging the pessary and without discomfort.  A 2 ring type pessary was NOT well tolerated. Care instructions were provided. Patient was discharged to home in stable condition.   Barnett Applebaum, MD, [facility], [facility] 01/27/2018  3:33 PM

## 2018-01-28 ENCOUNTER — Other Ambulatory Visit: Payer: Federal, State, Local not specified - PPO

## 2018-02-23 ENCOUNTER — Ambulatory Visit (INDEPENDENT_AMBULATORY_CARE_PROVIDER_SITE_OTHER): Payer: Medicare Other | Admitting: Internal Medicine

## 2018-02-23 ENCOUNTER — Encounter: Payer: Self-pay | Admitting: Internal Medicine

## 2018-02-23 ENCOUNTER — Encounter

## 2018-02-23 VITALS — BP 150/90 | HR 74 | Temp 97.9°F | Resp 18 | Wt 139.0 lb

## 2018-02-23 DIAGNOSIS — R739 Hyperglycemia, unspecified: Secondary | ICD-10-CM

## 2018-02-23 DIAGNOSIS — E7800 Pure hypercholesterolemia, unspecified: Secondary | ICD-10-CM | POA: Diagnosis not present

## 2018-02-23 DIAGNOSIS — I10 Essential (primary) hypertension: Secondary | ICD-10-CM | POA: Diagnosis not present

## 2018-02-23 DIAGNOSIS — F419 Anxiety disorder, unspecified: Secondary | ICD-10-CM

## 2018-02-23 DIAGNOSIS — Z1231 Encounter for screening mammogram for malignant neoplasm of breast: Secondary | ICD-10-CM

## 2018-02-23 DIAGNOSIS — G4700 Insomnia, unspecified: Secondary | ICD-10-CM

## 2018-02-23 DIAGNOSIS — F439 Reaction to severe stress, unspecified: Secondary | ICD-10-CM

## 2018-02-23 LAB — BASIC METABOLIC PANEL
BUN: 17 mg/dL (ref 6–23)
CHLORIDE: 101 meq/L (ref 96–112)
CO2: 28 mEq/L (ref 19–32)
Calcium: 9.9 mg/dL (ref 8.4–10.5)
Creatinine, Ser: 0.74 mg/dL (ref 0.40–1.20)
GFR: 81.31 mL/min (ref >60.00)
Glucose, Bld: 98 mg/dL (ref 70–99)
POTASSIUM: 4.3 meq/L (ref 3.5–5.1)
Sodium: 136 mEq/L (ref 135–145)

## 2018-02-23 LAB — HEPATIC FUNCTION PANEL
ALT: 14 U/L (ref 0–35)
AST: 17 U/L (ref 0–37)
Albumin: 4.3 g/dL (ref 3.5–5.2)
Alkaline Phosphatase: 85 U/L (ref 39–117)
Bilirubin, Direct: 0.1 mg/dL (ref 0.0–0.3)
Total Bilirubin: 0.5 mg/dL (ref 0.2–1.2)
Total Protein: 7.3 g/dL (ref 6.0–8.3)

## 2018-02-23 LAB — LIPID PANEL
Cholesterol: 210 mg/dL — ABNORMAL HIGH (ref 0–200)
HDL: 64.4 mg/dL
LDL Cholesterol: 122 mg/dL — ABNORMAL HIGH (ref 0–99)
NonHDL: 145.33
Total CHOL/HDL Ratio: 3
Triglycerides: 116 mg/dL (ref 0.0–149.0)
VLDL: 23.2 mg/dL (ref 0.0–40.0)

## 2018-02-23 LAB — HEMOGLOBIN A1C: Hgb A1c MFr Bld: 5.9 % (ref 4.6–6.5)

## 2018-02-23 MED ORDER — LISINOPRIL 5 MG PO TABS: 5.0000 mg | ORAL_TABLET | Freq: Every day | ORAL | 0 refills | Status: DC

## 2018-02-23 MED ORDER — CLONAZEPAM 0.5 MG PO TABS: 0.5000 mg | ORAL_TABLET | Freq: Every day | ORAL | 1 refills | Status: DC | PRN

## 2018-02-23 NOTE — Progress Notes (Signed)
[name], female   DOB: [birth year], 77 y.o.   MRN: [id number]   Subjective:    [name], female    DOB: April 19, 1940, 77 y.o.   MRN: [id number]  HPI  Patient here for a scheduled follow up.  Here to f/u on her blood pressure.  She was started on lisinopril 32m q day last visit.  Out of her medication for hte past several days.  States she has been doing well.  Some increased stress recently.  Daughter is getting married.  Trying to help with the wedding.  Overall handling things well.  No chest pain.  No sob.  No acid reflux.  No abdominal pain.  Bowels moving.  Was seen 01/27/18 for pessary fitting.     Past Medical History:  Diagnosis Date  . Allergy   . Carpal tunnel syndrome 1999  . GERD (gastroesophageal reflux disease)   . Hypercholesterolemia    By last lipid panel  . Hypertension    Borderline. She does not take the 12.5 HCTZ daily   Past Surgical History:  Procedure Laterality Date  . APPENDECTOMY  1963  . CARPAL TUNNEL RELEASE     right hand  . COLONOSCOPY    . COLONOSCOPY WITH PROPOFOL N/A 12/01/2016   Procedure: COLONOSCOPY WITH PROPOFOL;  Surgeon: WLucilla Lame MD;  [facility]  Service: Gastroenterology;  Laterality: N/A;  . EYE SURGERY     left  . POLYPECTOMY  12/01/2016   Procedure: POLYPECTOMY INTESTINAL;  Surgeon: WLucilla Lame MD;  Location[facility]  Service: Gastroenterology;;  Transverse colon polyp x 2 Ascending colon polyp x 3  . TONSILLECTOMY     Family History  Problem Relation Age of Onset  . Diabetes Mother   . Hypertension Brother   . Diabetes Maternal Grandmother   . Asthma Daughter   . Breast cancer Other   . Colon cancer Neg Hx    Social History   Socioeconomic History  . Marital status: Married    Spouse name: Not on file  . Number of children: 3  . Years of education: Not on file  . Highest education level: Not on file  Occupational History    Employer: OTHER  Social Needs  .  Financial resource strain: Not hard at all  . Food insecurity:    Worry: Never true    Inability: Never true  . Transportation needs:    Medical: No    Non-medical: No  Tobacco Use  . Smoking status: Never Smoker  . Smokeless tobacco: Never Used  Substance and Sexual Activity  . Alcohol use: Yes    Alcohol/week: 0.0 standard drinks    Comment: 2-3 drinks/month  . Drug use: No  . Sexual activity: Never  Lifestyle  . Physical activity:    Days per week: 4 days    Minutes per session: 90 min  . Stress: Not at all  Relationships  . Social connections:    Talks on phone: Not on file    Gets together: Not on file    Attends religious service: Not on file    Active member of club or organization: Not on file    Attends meetings of clubs or organizations: Not on file    Relationship status: Not on file  Other Topics Concern  . Not on file  Social History Narrative   Recently widowed. Her husband died of a heart attack in J01/19/2017   Outpatient  Encounter Medications as of 02/23/2018  Medication Sig  . ALOE VERA PO Take 1 tablet by mouth daily.   . Cholecalciferol (VITAMIN D3) 1000 UNITS CAPS Take 1,000 Units by mouth 2 (two) times daily.   . clonazePAM (KLONOPIN) 0.5 MG tablet Take 1 tablet (0.5 mg total) by mouth daily as needed for anxiety.  Marland Kitchen co-enzyme Q-10 30 MG capsule Take 30 mg by mouth 2 (two) times daily.  Marland Kitchen conjugated estrogens (PREMARIN) vaginal cream Place 1 Applicatorful vaginally daily. Apply 0.57m (pea-sized amount)  just inside the vaginal introitus with a finger-tip on  Monday, Wednesday and Friday nights.  . CYANOCOBALAMIN PO Take 1 tablet by mouth daily.   .Marland Kitchenestradiol (ESTRACE VAGINAL) 0.1 MG/GM vaginal cream Apply 0.550m(pea-sized amount)  just inside the vaginal introitus with a finger-tip on Monday, Wednesday and Friday nights.  . fluticasone (FLONASE) 50 MCG/ACT nasal spray Place 2 sprays into both nostrils daily.  . Marland Kitchenisinopril (PRINIVIL,ZESTRIL) 5 MG  tablet Take 1 tablet (5 mg total) by mouth daily.  . magnesium 30 MG tablet Take 30 mg by mouth 1 day or 1 dose.  . Marland KitchenAGNESIUM CITRATE PO Take 1 tablet by mouth daily.   . mometasone (ELOCON) 0.1 % lotion Apply topically daily. For no more than 7 days.  . Marland Kitchenmega-3 acid ethyl esters (LOVAZA) 1 g capsule Take by mouth 3 (three) times daily.  . Omega-3 Fatty Acids (FISH OIL) 1000 MG CAPS Take 1,000 mg by mouth 2 (two) times daily.   . Red Yeast Rice Extract (RED YEAST RICE PO) Take 2 capsules by mouth daily.  . vitamin C (ASCORBIC ACID) 500 MG tablet Take 500 mg by mouth 2 (two) times daily.  . [DISCONTINUED] clonazePAM (KLONOPIN) 0.5 MG tablet Take 1 tablet (0.5 mg total) by mouth daily as needed for anxiety.  . [DISCONTINUED] lisinopril (PRINIVIL,ZESTRIL) 5 MG tablet Take 1 tablet (5 mg total) by mouth daily.   Facility-Administered Encounter Medications as of 02/23/2018  Medication  . methylPREDNISolone acetate (DEPO-MEDROL) injection 40 mg    Review of Systems  Constitutional: Negative for appetite change and unexpected weight change.  HENT: Negative for congestion and sinus pressure.   Respiratory: Negative for cough, chest tightness and shortness of breath.   Cardiovascular: Negative for chest pain, palpitations and leg swelling.  Gastrointestinal: Negative for abdominal pain, diarrhea, nausea and vomiting.  Genitourinary: Negative for difficulty urinating and dysuria.  Musculoskeletal: Negative for joint swelling and myalgias.  Skin: Negative for color change and rash.  Neurological: Negative for dizziness, light-headedness and headaches.  Psychiatric/Behavioral: Negative for agitation and dysphoric mood.       Objective:    Physical Exam  Constitutional: She appears well-developed and well-nourished. No distress.  HENT:  Nose: Nose normal.  Mouth/Throat: Oropharynx is clear and moist.  Neck: Neck supple. No thyromegaly present.  Cardiovascular: Normal rate and regular rhythm.   Pulmonary/Chest: Breath sounds normal. No respiratory distress. She has no wheezes.  Abdominal: Soft. Bowel sounds are normal. There is no tenderness.  Musculoskeletal: She exhibits no edema or tenderness.  Lymphadenopathy:    She has no cervical adenopathy.  Skin: No rash noted. No erythema.  Psychiatric: She has a normal mood and affect. Her behavior is normal.    BP (!) 150/90 (BP Location: Left Arm, Patient Position: Sitting, Cuff Size: Normal)   Pulse 74   Temp 97.9 F (36.6 C) (Oral)   Resp 18   Wt 139 lb (63 kg)   LMP 04/28/1992  SpO2 98%   BMI 25.42 kg/m  Wt Readings from Last 3 Encounters:  02/25/18 138 lb (62.6 kg)  02/23/18 139 lb (63 kg)  01/27/18 140 lb (63.5 kg)     Lab Results  Component Value Date   WBC 6.8 05/05/2017   HGB 14.7 05/05/2017   HCT 44.1 05/05/2017   PLT 235.0 05/05/2017   GLUCOSE 98 02/23/2018   CHOL 210 (H) 02/23/2018   TRIG 116.0 02/23/2018   HDL 64.40 02/23/2018   LDLCALC 122 (H) 02/23/2018   ALT 14 02/23/2018   AST 17 02/23/2018   NA 136 02/23/2018   K 4.3 02/23/2018   CL 101 02/23/2018   CREATININE 0.74 02/23/2018   BUN 17 02/23/2018   CO2 28 02/23/2018   TSH 1.63 05/05/2017   HGBA1C 5.9 02/23/2018    Mm Screening Breast Tomo Bilateral  Result Date: 06/09/2017 CLINICAL DATA:  Screening. EXAM: DIGITAL SCREENING BILATERAL MAMMOGRAM WITH TOMO AND CAD COMPARISON:  Previous exam(s). ACR Breast Density Category b: There are scattered areas of fibroglandular density. FINDINGS: There are no findings suspicious for malignancy. Images were processed with CAD. IMPRESSION: No mammographic evidence of malignancy. A result letter of this screening mammogram will be mailed directly to the patient. RECOMMENDATION: Screening mammogram in one year. (Code:SM-B-01Y) BI-RADS CATEGORY  1: Negative. Electronically Signed   By: Everlean Alstrom M.D.   On: 06/09/2017 13:35       Assessment & Plan:   Problem List Items Addressed This Visit     Anxiety    Some increased anxiety and stress as outlined.  Discussed with her today.  Overall handling things well.  Follow.        Relevant Medications   clonazePAM (KLONOPIN) 0.5 MG tablet   Hypercholesterolemia    Low cholesterol diet and exercise.  Follow lipid panel.  Off crestor.        Relevant Medications   lisinopril (PRINIVIL,ZESTRIL) 5 MG tablet   Hyperglycemia    Low carb diet and exercise.  Follow met b and a1c.        Hypertension    Blood pressure still elevated. Has been out of her medication. Recheck improved.  Continue same medication regimen.  Follow pressures.  Follow metabolic panel.        Relevant Medications   lisinopril (PRINIVIL,ZESTRIL) 5 MG tablet   Insomnia   Relevant Medications   clonazePAM (KLONOPIN) 0.5 MG tablet   Stress    Overall handling things relatively well.  Follow.  Does not feel needs any further intervention.        Other Visit Diagnoses    Visit for screening mammogram    -  Primary   Relevant Orders   MM 3D SCREEN BREAST BILATERAL       [name], MD

## 2018-02-25 ENCOUNTER — Ambulatory Visit (INDEPENDENT_AMBULATORY_CARE_PROVIDER_SITE_OTHER): Payer: Medicare Other | Admitting: Obstetrics & Gynecology

## 2018-02-25 ENCOUNTER — Encounter: Payer: Self-pay | Admitting: Obstetrics & Gynecology

## 2018-02-25 VITALS — BP 130/80 | Ht 62.0 in | Wt 138.0 lb

## 2018-02-25 DIAGNOSIS — N8111 Cystocele, midline: Secondary | ICD-10-CM

## 2018-02-25 DIAGNOSIS — N814 Uterovaginal prolapse, unspecified: Secondary | ICD-10-CM | POA: Diagnosis not present

## 2018-02-25 DIAGNOSIS — N816 Rectocele: Secondary | ICD-10-CM

## 2018-02-25 NOTE — Progress Notes (Signed)
  HPI:      [name] is a 77 y.o. [phone] who presents today for her pessary follow up and examination related to her pelvic floor weakening.  Pt reports tolerating the pessary well with  no vaginal bleeding and  no vaginal discharge.  Symptoms of pelvic floor weakening have greatly improved. She is voiding and defecating without difficulty. She currently has a #3 Ring pessary.  PMHx: She  has a past medical history of Allergy, Carpal tunnel syndrome (1999), GERD (gastroesophageal reflux disease), Hypercholesterolemia, and Hypertension. Also,  has a past surgical history that includes Appendectomy (1963); Tonsillectomy; Eye surgery; Carpal tunnel release; Colonoscopy; Colonoscopy with propofol (N/A, 12/01/2016); and Polypectomy (12/01/2016)., family history includes Asthma in her daughter; Breast cancer in her other; Diabetes in her maternal grandmother and mother; Hypertension in her brother.,  reports that she has never smoked. She has never used smokeless tobacco. She reports that she drinks alcohol. She reports that she does not use drugs.  She has a current medication list which includes the following prescription(s): aloe vera, vitamin d3, clonazepam, co-enzyme q-10, conjugated estrogens, cyanocobalamin, estradiol, fluticasone, lisinopril, magnesium, magnesium citrate, mometasone, omega-3 acid ethyl esters, fish oil, red yeast rice extract, and vitamin c, and the following Facility-Administered Medications: methylprednisolone acetate. Also, is allergic to codeine sulfate.  Review of Systems  All other systems reviewed and are negative.   Objective: BP 130/80   Ht 5\' 2"  (1.575 m)   Wt 138 lb (62.6 kg)   LMP 04/28/1992   BMI 25.24 kg/m  Physical Exam  Constitutional: She is oriented to person, place, and time. She appears well-developed and well-nourished. No distress.  Genitourinary: Vagina normal and uterus normal. Pelvic exam was performed with patient supine. There is no rash,  tenderness or lesion on the right labia. There is no rash, tenderness or lesion on the left labia. No erythema or bleeding in the vagina. Right adnexum does not display mass and does not display tenderness. Left adnexum does not display mass and does not display tenderness. Cervix does not exhibit motion tenderness, discharge, polyp or nabothian cyst.   Uterus is mobile and midaxial. Uterus is not enlarged or exhibiting a mass.  Genitourinary Comments: Gr 3 uterine prolapse Gr 2 rectocele and cystocele, esp towards vag apex Min atrophy   HENT:  Head: Normocephalic and atraumatic.  Nose: Nose normal.  Mouth/Throat: Oropharynx is clear and moist.  Abdominal: Soft. She exhibits no distension. There is no tenderness.  Musculoskeletal: Normal range of motion.  Neurological: She is alert and oriented to person, place, and time. No cranial nerve deficit.  Skin: Skin is warm and dry.  Psychiatric: She has a normal mood and affect.    Pessary Care Pessary removed and cleaned.  Vagina checked - without erosions - pessary replaced.  A/P:   1. Cystocele, midline 2. Uterine prolapse 3. Rectocele Pessary was cleaned and replaced today. Instructions given for care. Concerning symptoms to observe for are counseled to patient. Follow up scheduled for 3 months.  A total of 15 minutes were spent face-to-face with the patient during this encounter and over half of that time dealt with counseling and coordination of care.  Barnett Applebaum, MD, [facility], [facility] 02/25/2018  9:01 AM

## 2018-03-01 ENCOUNTER — Encounter: Payer: Self-pay | Admitting: Internal Medicine

## 2018-03-01 ENCOUNTER — Telehealth: Payer: Self-pay | Admitting: Internal Medicine

## 2018-03-01 NOTE — Assessment & Plan Note (Addendum)
Blood pressure still elevated. Has been out of her medication. Recheck improved.  Continue same medication regimen.  Follow pressures.  Follow metabolic panel.

## 2018-03-01 NOTE — Assessment & Plan Note (Signed)
Low cholesterol diet and exercise.  Follow lipid panel.  Off crestor.   

## 2018-03-01 NOTE — Telephone Encounter (Signed)
Copied from [id number]. Topic: Quick Communication - Lab Results (Clinic Use ONLY) >> Mar 01, 2018  9:42 AM Lars Masson, LPN wrote: Called patient to inform them of lab results. When patient returns call, triage nurse may disclose results.

## 2018-03-01 NOTE — Assessment & Plan Note (Signed)
Overall handling things relatively well.  Follow.  Does not feel needs any further intervention.   

## 2018-03-01 NOTE — Assessment & Plan Note (Signed)
Some increased anxiety and stress as outlined.  Discussed with her today.  Overall handling things well.  Follow.

## 2018-03-01 NOTE — Telephone Encounter (Signed)
Left message on voicemail for pt to return call to office for results.    

## 2018-03-01 NOTE — Assessment & Plan Note (Signed)
Low carb diet and exercise.  Follow met b and a1c.   

## 2018-03-16 ENCOUNTER — Ambulatory Visit (INDEPENDENT_AMBULATORY_CARE_PROVIDER_SITE_OTHER): Payer: Medicare Other | Admitting: Podiatry

## 2018-03-16 ENCOUNTER — Ambulatory Visit: Payer: Medicare Other

## 2018-03-16 ENCOUNTER — Encounter: Payer: Self-pay | Admitting: Podiatry

## 2018-03-16 VITALS — BP 117/67 | HR 79 | Resp 16

## 2018-03-16 DIAGNOSIS — L603 Nail dystrophy: Secondary | ICD-10-CM

## 2018-03-16 DIAGNOSIS — M7752 Other enthesopathy of left foot: Secondary | ICD-10-CM

## 2018-03-16 MED ORDER — DOXYCYCLINE HYCLATE 100 MG PO TABS: 100.0000 mg | ORAL_TABLET | Freq: Two times a day (BID) | ORAL | 0 refills | Status: DC

## 2018-03-16 NOTE — Progress Notes (Signed)
She presents today chief concern of a hallux nail left.  States that is thick and discolored times many years currently taking fluconazole 1 pill a day for the past year.  I am concerned because I went for a pedicure and this was really red and had some green discharge but it seems to be doing a little better now is not as painful but I think it may have also come from wearing too tight of boots.  Objective: Vital signs are stable she is alert and oriented x3.  Erythema to the tibial border of the hallux left pulses are strong and palpable.  Neurologic sensorium is intact degenerative flexors are intact.  Resolving abscess hallux tibial border left.  Plan: Started her on doxycycline 100 mg twice daily start Epsom salts and warm water soaks should this not resolve follow-up with me for matrixectomy.

## 2018-03-22 ENCOUNTER — Encounter: Payer: Self-pay | Admitting: Obstetrics & Gynecology

## 2018-03-22 ENCOUNTER — Ambulatory Visit (INDEPENDENT_AMBULATORY_CARE_PROVIDER_SITE_OTHER): Payer: Medicare Other | Admitting: Obstetrics & Gynecology

## 2018-03-22 VITALS — BP 110/80 | Ht 62.0 in | Wt 140.0 lb

## 2018-03-22 DIAGNOSIS — N814 Uterovaginal prolapse, unspecified: Secondary | ICD-10-CM

## 2018-03-22 DIAGNOSIS — N8111 Cystocele, midline: Secondary | ICD-10-CM

## 2018-03-22 NOTE — Progress Notes (Signed)
  HPI:      [name] is a 77 y.o. [phone] who presents today for concerns over her pessary (#3ring)placed last month, feels it has had a couple of occasions of dropping down.  Occas GSI (rare).  No bleeding or discharge.  Prolapse sx's have improved.  PMHx: She  has a past medical history of Allergy, Carpal tunnel syndrome (1999), GERD (gastroesophageal reflux disease), Hypercholesterolemia, and Hypertension. Also,  has a past surgical history that includes Appendectomy (1963); Tonsillectomy; Eye surgery; Carpal tunnel release; Colonoscopy; Colonoscopy with propofol (N/A, 12/01/2016); and Polypectomy (12/01/2016)., family history includes Asthma in her daughter; Breast cancer in an other family member; Diabetes in her maternal grandmother and mother; Hypertension in her brother.,  reports that she has never smoked. She has never used smokeless tobacco. She reports current alcohol use. She reports that she does not use drugs.  She has a current medication list which includes the following prescription(s): aloe vera, vitamin d3, clonazepam, co-enzyme q-10, conjugated estrogens, cyanocobalamin, doxycycline, estradiol, fluconazole, fluticasone, lisinopril, magnesium, magnesium citrate, mometasone, omega-3 acid ethyl esters, fish oil, red yeast rice extract, and vitamin c, and the following Facility-Administered Medications: methylprednisolone acetate. Also, is allergic to codeine sulfate.  Review of Systems  All other systems reviewed and are negative.   Objective: BP 110/80   Ht 5\' 2"  (1.575 m)   Wt 140 lb (63.5 kg)   LMP 04/28/1992   BMI 25.61 kg/m  Physical Exam Constitutional:      General: She is not in acute distress.    Appearance: She is well-developed.  Genitourinary:     Pelvic exam was performed with patient supine.     Vagina and uterus normal.     No vaginal erythema or bleeding.     No cervical motion tenderness, discharge, polyp or nabothian cyst.     Uterus is mobile.   Uterus is not enlarged.     No uterine mass detected.    Uterus is midaxial.     No right or left adnexal mass present.     Right adnexa not tender.     Left adnexa not tender.     Genitourinary Comments: Gr 3 uterine prolapse Gr 2 rectocele and cystocele, esp towards vag apex Min atrophy    HENT:     Head: Normocephalic and atraumatic.     Nose: Nose normal.  Abdominal:     General: There is no distension.     Palpations: Abdomen is soft.     Tenderness: There is no abdominal tenderness.  Musculoskeletal: Normal range of motion.  Neurological:     Mental Status: She is alert and oriented to person, place, and time.     Cranial Nerves: No cranial nerve deficit.  Skin:    General: Skin is warm and dry.     Pessary Care Pessary in correct orientation.  Vagina checked - without erosions  A/P:  Pelvic organ prolapse  Pessary was determined to be correctly located today. Instructions given for care. Concerning symptoms to observe for are counseled to patient. Follow up scheduled for 3 months.   A total of 15 minutes were spent face-to-face with the patient during this encounter and over half of that time dealt with counseling and coordination of care.  Barnett Applebaum, MD, [facility], [facility] 03/22/2018  11:05 AM

## 2018-03-26 DIAGNOSIS — S8012XA Contusion of left lower leg, initial encounter: Secondary | ICD-10-CM | POA: Diagnosis not present

## 2018-04-15 ENCOUNTER — Ambulatory Visit: Payer: Medicare Other | Admitting: Podiatry

## 2018-04-20 ENCOUNTER — Encounter: Payer: Self-pay | Admitting: Internal Medicine

## 2018-04-20 ENCOUNTER — Ambulatory Visit (INDEPENDENT_AMBULATORY_CARE_PROVIDER_SITE_OTHER): Payer: Medicare Other | Admitting: Internal Medicine

## 2018-04-20 VITALS — BP 128/78 | HR 75 | Temp 98.1°F | Resp 16 | Wt 137.6 lb

## 2018-04-20 DIAGNOSIS — J069 Acute upper respiratory infection, unspecified: Secondary | ICD-10-CM | POA: Diagnosis not present

## 2018-04-20 DIAGNOSIS — I10 Essential (primary) hypertension: Secondary | ICD-10-CM

## 2018-04-20 DIAGNOSIS — E7800 Pure hypercholesterolemia, unspecified: Secondary | ICD-10-CM | POA: Diagnosis not present

## 2018-04-20 DIAGNOSIS — N812 Incomplete uterovaginal prolapse: Secondary | ICD-10-CM

## 2018-04-20 DIAGNOSIS — R739 Hyperglycemia, unspecified: Secondary | ICD-10-CM | POA: Diagnosis not present

## 2018-04-20 NOTE — Progress Notes (Signed)
Patient ID: Maudy E Tiu, female   DOB: 08/15/1940, 78 y.o.   MRN: [id number]   Subjective:    Patient ID: Marquia E Tassin, female    DOB: 03/14/1941, 78 y.o.   MRN: [id number]  HPI  Patient here for a scheduled follow up.  She reports that recently she started having increased head and nasal congestion.  Some chills previously.  No fever.  Some increased drainage and runny nose.  Increased cough and congestion.  Some chest congestion.  Ears itching.  No chest pain.  No sob.  No acid reflux.  No abdominal pain.  Bowels moving.  Has pessary.     Past Medical History:  Diagnosis Date  . Allergy   . Carpal tunnel syndrome 1999  . GERD (gastroesophageal reflux disease)   . Hypercholesterolemia    By last lipid panel  . Hypertension    Borderline. She does not take the 12.5 HCTZ daily   Past Surgical History:  Procedure Laterality Date  . APPENDECTOMY  1963  . CARPAL TUNNEL RELEASE     right hand  . COLONOSCOPY    . COLONOSCOPY WITH PROPOFOL N/A 12/01/2016   Procedure: COLONOSCOPY WITH PROPOFOL;  Surgeon: Wohl, Darren, MD;  Location: [facility];  Service: Gastroenterology;  Laterality: N/A;  . EYE SURGERY     left  . POLYPECTOMY  12/01/2016   Procedure: POLYPECTOMY INTESTINAL;  Surgeon: Wohl, Darren, MD;  Location: [facility];  Service: Gastroenterology;;  Transverse colon polyp x 2 Ascending colon polyp x 3  . TONSILLECTOMY     Family History  Problem Relation Age of Onset  . Diabetes Mother   . Hypertension Brother   . Diabetes Maternal Grandmother   . Asthma Daughter   . Breast cancer Other   . Colon cancer Neg Hx    Social History   Socioeconomic History  . Marital status: Married    Spouse name: Not on file  . Number of children: 3  . Years of education: Not on file  . Highest education level: Not on file  Occupational History    Employer: OTHER  Social Needs  . Financial resource strain: Not hard at all  . Food insecurity:    Worry: Never true   Inability: Never true  . Transportation needs:    Medical: No    Non-medical: No  Tobacco Use  . Smoking status: Never Smoker  . Smokeless tobacco: Never Used  Substance and Sexual Activity  . Alcohol use: Yes    Alcohol/week: 0.0 standard drinks    Comment: 2-3 drinks/month  . Drug use: No  . Sexual activity: Never  Lifestyle  . Physical activity:    Days per week: 4 days    Minutes per session: 90 min  . Stress: Not at all  Relationships  . Social connections:    Talks on phone: Not on file    Gets together: Not on file    Attends religious service: Not on file    Active member of club or organization: Not on file    Attends meetings of clubs or organizations: Not on file    Relationship status: Not on file  Other Topics Concern  . Not on file  Social History Narrative   Recently widowed. Her husband died of a heart attack in January 2017    Outpatient Encounter Medications as of 04/20/2018  Medication Sig  . ALOE VERA PO Take 1 tablet by mouth daily.   . Cholecalciferol (VITAMIN D3)   1000 UNITS CAPS Take 1,000 Units by mouth 2 (two) times daily.   . clonazePAM (KLONOPIN) 0.5 MG tablet Take 1 tablet (0.5 mg total) by mouth daily as needed for anxiety.  Marland Kitchen co-enzyme Q-10 30 MG capsule Take 30 mg by mouth 2 (two) times daily.  Marland Kitchen conjugated estrogens (PREMARIN) vaginal cream Place 1 Applicatorful vaginally daily. Apply 0.62m (pea-sized amount)  just inside the vaginal introitus with a finger-tip on  Monday, Wednesday and Friday nights.  . CYANOCOBALAMIN PO Take 1 tablet by mouth daily.   .Marland Kitchenestradiol (ESTRACE VAGINAL) 0.1 MG/GM vaginal cream Apply 0.532m(pea-sized amount)  just inside the vaginal introitus with a finger-tip on Monday, Wednesday and Friday nights.  . fluconazole (DIFLUCAN) 150 MG tablet Take 150 mg by mouth daily.  . fluticasone (FLONASE) 50 MCG/ACT nasal spray Place 2 sprays into both nostrils daily.  . Marland Kitchenisinopril (PRINIVIL,ZESTRIL) 5 MG tablet Take 1 tablet (5  mg total) by mouth daily.  . magnesium 30 MG tablet Take 30 mg by mouth 1 day or 1 dose.  . Marland KitchenAGNESIUM CITRATE PO Take 1 tablet by mouth daily.   . mometasone (ELOCON) 0.1 % lotion Apply topically daily. For no more than 7 days.  . Marland Kitchenmega-3 acid ethyl esters (LOVAZA) 1 g capsule Take by mouth 3 (three) times daily.  . Omega-3 Fatty Acids (FISH OIL) 1000 MG CAPS Take 1,000 mg by mouth 2 (two) times daily.   . Red Yeast Rice Extract (RED YEAST RICE PO) Take 2 capsules by mouth daily.  . vitamin C (ASCORBIC ACID) 500 MG tablet Take 500 mg by mouth 2 (two) times daily.  . [DISCONTINUED] doxycycline (VIBRA-TABS) 100 MG tablet Take 1 tablet (100 mg total) by mouth 2 (two) times daily.  . [DISCONTINUED] methylPREDNISolone acetate (DEPO-MEDROL) injection 40 mg    No facility-administered encounter medications on file as of 04/20/2018.     Review of Systems  Constitutional: Negative for appetite change and unexpected weight change.  HENT: Positive for congestion and postnasal drip.   Respiratory: Positive for cough. Negative for chest tightness and shortness of breath.   Cardiovascular: Negative for chest pain, palpitations and leg swelling.  Gastrointestinal: Negative for abdominal pain, diarrhea, nausea and vomiting.  Genitourinary: Negative for difficulty urinating and dysuria.  Musculoskeletal: Negative for joint swelling and myalgias.  Skin: Negative for color change and rash.  Neurological: Negative for dizziness, light-headedness and headaches.  Psychiatric/Behavioral: Negative for agitation and dysphoric mood.       Objective:    Physical Exam Constitutional:      General: She is not in acute distress.    Appearance: Normal appearance.  HENT:     Nose: Nose normal.     Comments: Slightly erythematous turbinates.      Mouth/Throat:     Pharynx: No oropharyngeal exudate or posterior oropharyngeal erythema.  Neck:     Musculoskeletal: Neck supple. No muscular tenderness.      Thyroid: No thyromegaly.  Cardiovascular:     Rate and Rhythm: Normal rate and regular rhythm.  Pulmonary:     Effort: No respiratory distress.     Breath sounds: Normal breath sounds. No wheezing.  Abdominal:     General: Bowel sounds are normal.     Palpations: Abdomen is soft.     Tenderness: There is no abdominal tenderness.  Musculoskeletal:        General: No swelling or tenderness.  Lymphadenopathy:     Cervical: No cervical adenopathy.  Skin:  Findings: No erythema or rash.  Neurological:     Mental Status: She is alert.  Psychiatric:        Mood and Affect: Mood normal.        Behavior: Behavior normal.     BP 128/78 (BP Location: Left Arm, Patient Position: Sitting, Cuff Size: Normal)   Pulse 75   Temp 98.1 F (36.7 C) (Oral)   Resp 16   Wt 137 lb 9.6 oz (62.4 kg)   LMP 04/28/1992   SpO2 98%   BMI 25.17 kg/m  Wt Readings from Last 3 Encounters:  04/20/18 137 lb 9.6 oz (62.4 kg)  03/22/18 140 lb (63.5 kg)  02/25/18 138 lb (62.6 kg)     Lab Results  Component Value Date   WBC 6.8 05/05/2017   HGB 14.7 05/05/2017   HCT 44.1 05/05/2017   PLT 235.0 05/05/2017   GLUCOSE 98 02/23/2018   CHOL 210 (H) 02/23/2018   TRIG 116.0 02/23/2018   HDL 64.40 02/23/2018   LDLCALC 122 (H) 02/23/2018   ALT 14 02/23/2018   AST 17 02/23/2018   NA 136 02/23/2018   K 4.3 02/23/2018   CL 101 02/23/2018   CREATININE 0.74 02/23/2018   BUN 17 02/23/2018   CO2 28 02/23/2018   TSH 1.63 05/05/2017   HGBA1C 5.9 02/23/2018    Mm Screening Breast Tomo Bilateral  Result Date: 06/09/2017 CLINICAL DATA:  Screening. EXAM: DIGITAL SCREENING BILATERAL MAMMOGRAM WITH TOMO AND CAD COMPARISON:  Previous exam(s). ACR Breast Density Category b: There are scattered areas of fibroglandular density. FINDINGS: There are no findings suspicious for malignancy. Images were processed with CAD. IMPRESSION: No mammographic evidence of malignancy. A result letter of this screening mammogram will be  mailed directly to the patient. RECOMMENDATION: Screening mammogram in one year. (Code:SM-B-01Y) BI-RADS CATEGORY  1: Negative. Electronically Signed   By: Everlean Alstrom M.D.   On: 06/09/2017 13:35       Assessment & Plan:   Problem List Items Addressed This Visit    Cystocele with incomplete uterovaginal prolapse    Has pessary.        Hypercholesterolemia    Off crestor.  Low cholesterol diet and exercise.  Follow lipid panel.        Relevant Orders   Hepatic function panel   Lipid panel   Hyperglycemia    Low carb diet and exercise.  Follow met b and a1c.        Relevant Orders   Hemoglobin A1c   Hypertension    Blood pressure under good control.  Continue same medication regimen.  Follow pressures.  Follow metabolic panel.        Relevant Orders   CBC with Differential/Platelet   TSH   Basic metabolic panel    Other Visit Diagnoses    Upper respiratory tract infection, unspecified type    -  Primary   Probable viral URI.  Treat with saline nasal spray and nasacort.  Robitussin DM as directed.  Follow.         [name], MD

## 2018-04-20 NOTE — Patient Instructions (Signed)
Saline nasal spray - flush nose at least 2-3x/day  flonase nasal spray - 2 sprays each nostril one time per day. Do this in the evening.    Robitussin twice a day as needed for cough and congestion.

## 2018-04-25 ENCOUNTER — Encounter: Payer: Self-pay | Admitting: Internal Medicine

## 2018-04-25 NOTE — Assessment & Plan Note (Signed)
Blood pressure under good control.  Continue same medication regimen.  Follow pressures.  Follow metabolic panel.   

## 2018-04-25 NOTE — Assessment & Plan Note (Signed)
Low carb diet and exercise.  Follow met b and a1c.   

## 2018-04-25 NOTE — Assessment & Plan Note (Signed)
Has pessary 

## 2018-04-25 NOTE — Assessment & Plan Note (Signed)
Off crestor.  Low cholesterol diet and exercise.  Follow lipid panel.  

## 2018-04-28 ENCOUNTER — Encounter: Payer: Self-pay | Admitting: Podiatry

## 2018-04-28 ENCOUNTER — Ambulatory Visit (INDEPENDENT_AMBULATORY_CARE_PROVIDER_SITE_OTHER): Payer: Medicare Other | Admitting: Podiatry

## 2018-04-28 DIAGNOSIS — B351 Tinea unguium: Secondary | ICD-10-CM | POA: Diagnosis not present

## 2018-04-28 DIAGNOSIS — L601 Onycholysis: Secondary | ICD-10-CM | POA: Diagnosis not present

## 2018-04-28 DIAGNOSIS — L603 Nail dystrophy: Secondary | ICD-10-CM | POA: Diagnosis not present

## 2018-04-28 NOTE — Progress Notes (Signed)
She presents today for follow-up of abscess hallux left.  States that the toe is much better but the toenail looks bad.  Objective: Signs are stable she is alert and oriented x3.  Pulses are palpable.  There is no longer any erythema cellulitis drainage or odor but the nail does demonstrate what appears to be a nail dystrophy.  Assessment: Nail dystrophy hallux left.  Plan: Samples of the nail plate and skin were taken today for pathologic evaluation.  I will follow-up with her in 1 month

## 2018-05-06 DIAGNOSIS — Z08 Encounter for follow-up examination after completed treatment for malignant neoplasm: Secondary | ICD-10-CM | POA: Diagnosis not present

## 2018-05-06 DIAGNOSIS — Z85828 Personal history of other malignant neoplasm of skin: Secondary | ICD-10-CM | POA: Diagnosis not present

## 2018-05-06 DIAGNOSIS — L821 Other seborrheic keratosis: Secondary | ICD-10-CM | POA: Diagnosis not present

## 2018-05-06 DIAGNOSIS — B351 Tinea unguium: Secondary | ICD-10-CM | POA: Diagnosis not present

## 2018-05-07 ENCOUNTER — Other Ambulatory Visit
Admission: RE | Admit: 2018-05-07 | Discharge: 2018-05-07 | Disposition: A | Discharge status: Home / Self Care | Payer: Medicare Other | Source: Ambulatory Visit | Attending: Specialist | Admitting: Specialist

## 2018-05-07 DIAGNOSIS — M109 Gout, unspecified: Secondary | ICD-10-CM | POA: Diagnosis not present

## 2018-05-07 DIAGNOSIS — M11861 Other specified crystal arthropathies, right knee: Secondary | ICD-10-CM | POA: Diagnosis not present

## 2018-05-07 DIAGNOSIS — M179 Osteoarthritis of knee, unspecified: Secondary | ICD-10-CM | POA: Insufficient documentation

## 2018-05-07 DIAGNOSIS — M1711 Unilateral primary osteoarthritis, right knee: Secondary | ICD-10-CM | POA: Diagnosis not present

## 2018-05-07 DIAGNOSIS — M1710 Unilateral primary osteoarthritis, unspecified knee: Secondary | ICD-10-CM | POA: Insufficient documentation

## 2018-05-07 DIAGNOSIS — M1120 Other chondrocalcinosis, unspecified site: Secondary | ICD-10-CM | POA: Insufficient documentation

## 2018-05-07 LAB — SYNOVIAL CELL COUNT + DIFF, W/ CRYSTALS
Eosinophils-Synovial: 0 %
Lymphocytes-Synovial Fld: 1 %
Monocyte-Macrophage-Synovial Fluid: 4 %
Neutrophil, Synovial: 95 %
WBC, Synovial: 36650 /mm3 — ABNORMAL HIGH (ref 0–200)

## 2018-05-13 ENCOUNTER — Ambulatory Visit: Payer: Federal, State, Local not specified - PPO | Admitting: Internal Medicine

## 2018-05-17 DIAGNOSIS — M11861 Other specified crystal arthropathies, right knee: Secondary | ICD-10-CM | POA: Diagnosis not present

## 2018-05-17 DIAGNOSIS — M1711 Unilateral primary osteoarthritis, right knee: Secondary | ICD-10-CM | POA: Diagnosis not present

## 2018-05-24 ENCOUNTER — Encounter: Payer: Self-pay | Admitting: Podiatry

## 2018-05-24 ENCOUNTER — Ambulatory Visit (INDEPENDENT_AMBULATORY_CARE_PROVIDER_SITE_OTHER): Payer: Medicare Other | Admitting: Podiatry

## 2018-05-24 DIAGNOSIS — L603 Nail dystrophy: Secondary | ICD-10-CM

## 2018-05-24 MED ORDER — TERBINAFINE HCL 250 MG PO TABS: 250.0000 mg | ORAL_TABLET | Freq: Every day | ORAL | 0 refills | Status: DC

## 2018-05-24 NOTE — Progress Notes (Signed)
She presents today for follow-up of her Beto results.  She states that she has been on fluconazole for over a year and it just has not grown out.  Objective: Vital signs are stable alert and oriented x3.  Pulses are palpable.  Pathology does demonstrate dermatophytosis.  Assessment: Onychomycosis.  Plan: Discontinue the fluconazole start with Lamisil 250 mg 1 p.o. daily x30 days then follow-up with her for blood work.  I will also then consider at that time whether or not to use every other day or continue every day dosage.

## 2018-06-11 ENCOUNTER — Ambulatory Visit
Admission: RE | Admit: 2018-06-11 | Discharge: 2018-06-11 | Disposition: A | Discharge status: Home / Self Care | Payer: Medicare Other | Source: Ambulatory Visit | Attending: Internal Medicine | Admitting: Internal Medicine

## 2018-06-11 DIAGNOSIS — Z1231 Encounter for screening mammogram for malignant neoplasm of breast: Secondary | ICD-10-CM | POA: Insufficient documentation

## 2018-06-21 ENCOUNTER — Encounter: Payer: Self-pay | Admitting: Podiatry

## 2018-06-21 ENCOUNTER — Ambulatory Visit (INDEPENDENT_AMBULATORY_CARE_PROVIDER_SITE_OTHER): Payer: Medicare Other | Admitting: Podiatry

## 2018-06-21 ENCOUNTER — Other Ambulatory Visit: Payer: Self-pay

## 2018-06-21 DIAGNOSIS — L603 Nail dystrophy: Secondary | ICD-10-CM | POA: Diagnosis not present

## 2018-06-21 MED ORDER — TERBINAFINE HCL 250 MG PO TABS: 250.0000 mg | ORAL_TABLET | Freq: Every day | ORAL | 0 refills | Status: DC

## 2018-06-21 NOTE — Progress Notes (Signed)
She presents today after having completed her first 30 days of Lamisil.  States that she is doing very well with it.  No side effects no problems.  Objective: Vital signs are stable alert and oriented x3.  Pulses are palpable.  The toenail plate looks much better than it has previously I feel that at this point we can probably go to an every other day cycle.  Assessment: Slowly resolving onychomycosis.  Plan: 1 tablet by mouth Lamisil every other day.  Follow-up with her in 3 months 30 tablets dispensed.

## 2018-07-26 ENCOUNTER — Other Ambulatory Visit: Payer: Self-pay

## 2018-07-26 ENCOUNTER — Other Ambulatory Visit (INDEPENDENT_AMBULATORY_CARE_PROVIDER_SITE_OTHER): Payer: Medicare Other

## 2018-07-26 DIAGNOSIS — I10 Essential (primary) hypertension: Secondary | ICD-10-CM

## 2018-07-26 DIAGNOSIS — R739 Hyperglycemia, unspecified: Secondary | ICD-10-CM | POA: Diagnosis not present

## 2018-07-26 DIAGNOSIS — E7800 Pure hypercholesterolemia, unspecified: Secondary | ICD-10-CM | POA: Diagnosis not present

## 2018-07-26 LAB — CBC WITH DIFFERENTIAL/PLATELET
Basophils Absolute: 0.1 10*3/uL (ref 0.0–0.1)
Basophils Relative: 0.8 % (ref 0.0–3.0)
Eosinophils Absolute: 0.2 10*3/uL (ref 0.0–0.7)
Eosinophils Relative: 2.8 % (ref 0.0–5.0)
HCT: 41.5 % (ref 36.0–46.0)
Hemoglobin: 14 g/dL (ref 12.0–15.0)
Lymphocytes Relative: 31.9 % (ref 12.0–46.0)
Lymphs Abs: 2.1 10*3/uL (ref 0.7–4.0)
MCHC: 33.6 g/dL (ref 30.0–36.0)
MCV: 88.8 fl (ref 78.0–100.0)
Monocytes Absolute: 0.8 10*3/uL (ref 0.1–1.0)
Monocytes Relative: 12 % (ref 3.0–12.0)
Neutro Abs: 3.5 10*3/uL (ref 1.4–7.7)
Neutrophils Relative %: 52.5 % (ref 43.0–77.0)
Platelets: 239 10*3/uL (ref 150.0–400.0)
RBC: 4.68 Mil/uL (ref 3.87–5.11)
RDW: 13.8 % (ref 11.5–15.5)
WBC: 6.6 10*3/uL (ref 4.0–10.5)

## 2018-07-26 LAB — BASIC METABOLIC PANEL
BUN: 14 mg/dL (ref 6–23)
CO2: 28 mEq/L (ref 19–32)
Calcium: 9.7 mg/dL (ref 8.4–10.5)
Chloride: 103 mEq/L (ref 96–112)
Creatinine, Ser: 0.77 mg/dL (ref 0.40–1.20)
GFR: 72.99 mL/min (ref >60.00)
Glucose, Bld: 96 mg/dL (ref 70–99)
Potassium: 4 mEq/L (ref 3.5–5.1)
Sodium: 138 mEq/L (ref 135–145)

## 2018-07-26 LAB — HEPATIC FUNCTION PANEL
ALT: 14 U/L (ref 0–35)
AST: 19 U/L (ref 0–37)
Albumin: 4.3 g/dL (ref 3.5–5.2)
Alkaline Phosphatase: 80 U/L (ref 39–117)
Bilirubin, Direct: 0.1 mg/dL (ref 0.0–0.3)
Total Bilirubin: 0.6 mg/dL (ref 0.2–1.2)
Total Protein: 7.1 g/dL (ref 6.0–8.3)

## 2018-07-26 LAB — LIPID PANEL
Cholesterol: 218 mg/dL — ABNORMAL HIGH (ref 0–200)
HDL: 63.8 mg/dL (ref >39.00)
LDL Cholesterol: 131 mg/dL — ABNORMAL HIGH (ref 0–99)
NonHDL: 154.18
Total CHOL/HDL Ratio: 3
Triglycerides: 115 mg/dL (ref 0.0–149.0)
VLDL: 23 mg/dL (ref 0.0–40.0)

## 2018-07-26 LAB — TSH: TSH: 1.68 u[IU]/mL (ref 0.35–4.50)

## 2018-07-26 LAB — HEMOGLOBIN A1C: Hgb A1c MFr Bld: 5.8 % (ref 4.6–6.5)

## 2018-07-28 ENCOUNTER — Encounter: Payer: Self-pay | Admitting: Internal Medicine

## 2018-07-28 ENCOUNTER — Other Ambulatory Visit: Payer: Self-pay

## 2018-07-28 ENCOUNTER — Ambulatory Visit: Payer: Medicare Other | Admitting: Internal Medicine

## 2018-07-28 DIAGNOSIS — Z9109 Other allergy status, other than to drugs and biological substances: Secondary | ICD-10-CM

## 2018-07-28 DIAGNOSIS — N812 Incomplete uterovaginal prolapse: Secondary | ICD-10-CM

## 2018-07-28 DIAGNOSIS — E7800 Pure hypercholesterolemia, unspecified: Secondary | ICD-10-CM

## 2018-07-28 DIAGNOSIS — R739 Hyperglycemia, unspecified: Secondary | ICD-10-CM

## 2018-07-28 DIAGNOSIS — G4700 Insomnia, unspecified: Secondary | ICD-10-CM

## 2018-07-28 DIAGNOSIS — I10 Essential (primary) hypertension: Secondary | ICD-10-CM

## 2018-07-28 DIAGNOSIS — F419 Anxiety disorder, unspecified: Secondary | ICD-10-CM

## 2018-07-28 MED ORDER — CLONAZEPAM 0.5 MG PO TABS: 0.5000 mg | ORAL_TABLET | Freq: Every day | ORAL | 1 refills | Status: DC | PRN

## 2018-07-28 NOTE — Progress Notes (Addendum)
[name], female   DOB: March 22, 1941, 78 y.o.   MRN: [id number] Virtual Visit via Telephone Note  This visit type was conducted due to national recommendations for restrictions regarding the COVID-19 pandemic (e.g. social distancing).  This format is felt to be most appropriate for this patient at this time.  All issues noted in this document were discussed and addressed.  No physical exam was performed (except for noted visual exam findings with Video Visits).   I connected with Donna Lara on 07/28/18 at 11:00 AM EDT by telephone and verified that I am speaking with the correct person using two identifiers. Location patient: home Location provider: work Persons participating in the telephone visit: patient, provider  I discussed the limitations, risks, security and privacy concerns of performing an evaluation and management service by telephone and the availability of in person appointments. The patient expressed understanding and agreed to proceed.   Reason for visit: scheduled follow up appt.   HPI: She reports she is doing relatively well.  Trying to stay in.  No known COVID exposure.  No cough, congestion or sob.  Staying active.  [place]  No chest pain.  No acid reflux.  No abdominal pain.  Bowels moving.  Seeing podiatry.  Was on lamisil.  Off now.  Better.  Still has pessary.  Overall stable.  Of off crestor.  Discussed calculated cholesterol risk and desire to restart cholesterol medication.  Discussed low carb diet and exercise.  Just had mammogram 06/11/18 - [facility] I.    ROS: See pertinent positives and negatives per HPI.  Past Medical History:  Diagnosis Date  . Allergy   . Carpal tunnel syndrome 1999  . GERD (gastroesophageal reflux disease)   . Hypercholesterolemia    By last lipid panel  . Hypertension    Borderline. She does not take the 12.5 HCTZ daily    Past Surgical History:  Procedure Laterality Date  . APPENDECTOMY  1963  . CARPAL TUNNEL RELEASE      right hand  . COLONOSCOPY    . COLONOSCOPY WITH PROPOFOL N/A 12/01/2016   Procedure: COLONOSCOPY WITH PROPOFOL;  Surgeon: Lucilla Lame, MD;  Location: [facility];  Service: Gastroenterology;  Laterality: N/A;  . EYE SURGERY     left  . POLYPECTOMY  12/01/2016   Procedure: POLYPECTOMY INTESTINAL;  Surgeon: Lucilla Lame, MD;  Location: [facility];  Service: Gastroenterology;;  Transverse colon polyp x 2 Ascending colon polyp x 3  . TONSILLECTOMY      Family History  Problem Relation Age of Onset  . Diabetes Mother   . Hypertension Brother   . Diabetes Maternal Grandmother   . Asthma Daughter   . Breast cancer Other   . Colon cancer Neg Hx     SOCIAL HX: reviewed.    Current Outpatient Medications:  .  ALOE VERA PO, Take 1 tablet by mouth daily. , Disp: , Rfl:  .  Cholecalciferol (VITAMIN D3) 1000 UNITS CAPS, Take 1,000 Units by mouth 2 (two) times daily. , Disp: , Rfl:  .  clonazePAM (KLONOPIN) 0.5 MG tablet, Take 1 tablet (0.5 mg total) by mouth daily as needed for anxiety., Disp: 30 tablet, Rfl: 1 .  co-enzyme Q-10 30 MG capsule, Take 30 mg by mouth 2 (two) times daily., Disp: , Rfl:  .  conjugated estrogens (PREMARIN) vaginal cream, Place 1 Applicatorful vaginally daily. Apply 0.30m (pea-sized amount)  just inside the vaginal introitus with a finger-tip on  Monday, Wednesday and Friday nights., Disp: 30 g, Rfl: 12 .  CYANOCOBALAMIN PO, Take 1 tablet by mouth daily. , Disp: , Rfl:  .  estradiol (ESTRACE VAGINAL) 0.1 MG/GM vaginal cream, Apply 0.5mg (pea-sized amount)  just inside the vaginal introitus with a finger-tip on Monday, Wednesday and Friday nights., Disp: 30 g, Rfl: 12 .  fluticasone (FLONASE) 50 MCG/ACT nasal spray, Place 2 sprays into both nostrils daily., Disp: 16 g, Rfl: 6 .  lisinopril (PRINIVIL,ZESTRIL) 5 MG tablet, Take 1 tablet (5 mg total) by mouth daily., Disp: 90 tablet, Rfl: 0 .  magnesium 30 MG tablet, Take 30 mg by mouth 1 day or 1 dose.,  Disp: , Rfl:  .  MAGNESIUM CITRATE PO, Take 1 tablet by mouth daily. , Disp: , Rfl:  .  mometasone (ELOCON) 0.1 % lotion, Apply topically daily. For no more than 7 days., Disp: 30 mL, Rfl: 0 .  omega-3 acid ethyl esters (LOVAZA) 1 g capsule, Take by mouth 3 (three) times daily., Disp: , Rfl:  .  Omega-3 Fatty Acids (FISH OIL) 1000 MG CAPS, Take 1,000 mg by mouth 2 (two) times daily. , Disp: , Rfl:  .  Red Yeast Rice Extract (RED YEAST RICE PO), Take 2 capsules by mouth daily., Disp: , Rfl:  .  terbinafine (LAMISIL) 250 MG tablet, Take 1 tablet (250 mg total) by mouth daily., Disp: 30 tablet, Rfl: 0 .  vitamin C (ASCORBIC ACID) 500 MG tablet, Take 500 mg by mouth 2 (two) times daily., Disp: , Rfl:   EXAM:  GENERAL: alert.  Sounds to be in no acute distress.  Answering questions appropriately.    PSYCH/NEURO: pleasant and cooperative, no obvious depression or anxiety, speech and thought processing grossly intact  ASSESSMENT AND PLAN:  Discussed the following assessment and plan:  Insomnia, unspecified type - Plan: clonazePAM (KLONOPIN) 0.5 MG tablet  Anxiety - Plan: clonazePAM (KLONOPIN) 0.5 MG tablet  Cystocele with incomplete uterovaginal prolapse  Environmental allergies  Hypercholesterolemia  Hyperglycemia  Essential hypertension  Anxiety Overall she feels she is handling things relatively well.  Follow.    Cystocele with incomplete uterovaginal prolapse Has pessary.  Stable.   Environmental allergies Controlled.    Hypercholesterolemia Low cholesterol diet and exercise.  Off crestor.  Wants to remain off.  Follow lipid panel.   Hyperglycemia Low carb diet and exercise.  Follow met b and a1c.    Hypertension Blood pressure has been under good control.  Follow pressures.  Follow metabolic panel.   Insomnia Stable.      I discussed the assessment and treatment plan with the patient. The patient was provided an opportunity to ask questions and all were answered.  The patient agreed with the plan and demonstrated an understanding of the instructions.   The patient was advised to call back or seek an in-person evaluation if the symptoms worsen or if the condition fails to improve as anticipated.    Charlene Scott, MD  

## 2018-07-31 ENCOUNTER — Encounter: Payer: Self-pay | Admitting: Internal Medicine

## 2018-07-31 NOTE — Assessment & Plan Note (Signed)
Low cholesterol diet and exercise.  Off crestor.  Wants to remain off.  Follow lipid panel.

## 2018-07-31 NOTE — Assessment & Plan Note (Signed)
Overall she feels she is handling things relatively well.  Follow.  

## 2018-07-31 NOTE — Assessment & Plan Note (Signed)
Blood pressure has been under good control.  Follow pressures.  Follow metabolic panel.  

## 2018-07-31 NOTE — Assessment & Plan Note (Signed)
Low carb diet and exercise.  Follow met b and a1c.   

## 2018-07-31 NOTE — Assessment & Plan Note (Signed)
Has pessary.  Stable.

## 2018-07-31 NOTE — Assessment & Plan Note (Signed)
Controlled.  

## 2018-07-31 NOTE — Assessment & Plan Note (Signed)
Stable

## 2018-08-20 NOTE — Telephone Encounter (Signed)
rror

## 2018-09-21 NOTE — Progress Notes (Signed)
09/22/2018 10:10 PM   CORALYN ROSELLI [birth year] [id number]  Referring provider: [name], MD [address] [place],  [place] [address]  Chief Complaint  Patient presents with  . Cystocele    HPI: Patient is a 78 year old Caucasian female with a cystocele and vaginal atrophy who presents today for a one year follow-up.  Cystocele Managed with a pessary.  Finding it cumbersome, not painful.  May want to become sexual active in the future and has questions about her candidacy for surgical repair.    Vaginal atrophy Not using vaginal estrogen cream at this time.  Not complaining of vaginal dryness or irritation.    PMH: Past Medical History:  Diagnosis Date  . Allergy   . Carpal tunnel syndrome 1999  . GERD (gastroesophageal reflux disease)   . Hypercholesterolemia    By last lipid panel  . Hypertension    Borderline. She does not take the 12.5 HCTZ daily    Surgical History: Past Surgical History:  Procedure Laterality Date  . APPENDECTOMY  1963  . CARPAL TUNNEL RELEASE     right hand  . COLONOSCOPY    . COLONOSCOPY WITH PROPOFOL N/A 12/01/2016   Procedure: COLONOSCOPY WITH PROPOFOL;  Surgeon: Lucilla Lame, MD;  Location: [facility];  Service: Gastroenterology;  Laterality: N/A;  . EYE SURGERY     left  . POLYPECTOMY  12/01/2016   Procedure: POLYPECTOMY INTESTINAL;  Surgeon: Lucilla Lame, MD;  Location: [facility];  Service: Gastroenterology;;  Transverse colon polyp x 2 Ascending colon polyp x 3  . TONSILLECTOMY      Home Medications:  Allergies as of 09/22/2018      Reactions   Codeine Sulfate    Insomnia. Out of control.      Medication List       Accurate as of September 22, 2018 11:59 PM. If you have any questions, ask your nurse or doctor.        ALOE VERA PO Take 1 tablet by mouth daily.   clonazePAM 0.5 MG tablet Commonly known as: KLONOPIN Take 1 tablet (0.5 mg total) by mouth daily as needed for anxiety.    co-enzyme Q-10 30 MG capsule Take 30 mg by mouth 2 (two) times daily.   conjugated estrogens vaginal cream Commonly known as: Premarin Place 1 Applicatorful vaginally daily. Apply 0.5mg  (pea-sized amount)  just inside the vaginal introitus with a finger-tip on  Monday, Wednesday and Friday nights.   CYANOCOBALAMIN PO Take 1 tablet by mouth daily.   estradiol 0.1 MG/GM vaginal cream Commonly known as: ESTRACE VAGINAL Apply 0.5mg  (pea-sized amount)  just inside the vaginal introitus with a finger-tip on Monday, Wednesday and Friday nights.   Fish Oil 1000 MG Caps Take 1,000 mg by mouth 2 (two) times daily.   fluticasone 50 MCG/ACT nasal spray Commonly known as: FLONASE Place 2 sprays into both nostrils daily.   lisinopril 5 MG tablet Commonly known as: ZESTRIL Take 1 tablet (5 mg total) by mouth daily.   magnesium 30 MG tablet Take 30 mg by mouth 1 day or 1 dose.   MAGNESIUM CITRATE PO Take 1 tablet by mouth daily.   meloxicam 15 MG tablet Commonly known as: MOBIC   mometasone 0.1 % lotion Commonly known as: Elocon Apply topically daily. For no more than 7 days.   omega-3 acid ethyl esters 1 g capsule Commonly known as: LOVAZA Take by mouth 3 (three) times daily.   RED YEAST RICE PO  Take 2 capsules by mouth daily.   terbinafine 250 MG tablet Commonly known as: LamISIL Take 1 tablet (250 mg total) by mouth daily.   vitamin C 500 MG tablet Commonly known as: ASCORBIC ACID Take 500 mg by mouth 2 (two) times daily.   Vitamin D3 25 MCG (1000 UT) Caps Take 1,000 Units by mouth 2 (two) times daily.       Allergies:  Allergies  Allergen Reactions  . Codeine Sulfate     Insomnia. Out of control.    Family History: Family History  Problem Relation Age of Onset  . Diabetes Mother   . Hypertension Brother   . Diabetes Maternal Grandmother   . Asthma Daughter   . Breast cancer Other   . Colon cancer Neg Hx     Social History:  reports that she has  never smoked. She has never used smokeless tobacco. She reports current alcohol use. She reports that she does not use drugs.  ROS: UROLOGY Frequent Urination?: No Hard to postpone urination?: No Burning/pain with urination?: No Get up at night to urinate?: No Leakage of urine?: No Urine stream starts and stops?: No Trouble starting stream?: No Do you have to strain to urinate?: No Blood in urine?: No Urinary tract infection?: No Sexually transmitted disease?: No Injury to kidneys or bladder?: No Painful intercourse?: No Weak stream?: No Currently pregnant?: No Vaginal bleeding?: No Last menstrual period?: n  Gastrointestinal Nausea?: No Vomiting?: No Indigestion/heartburn?: No Diarrhea?: No Constipation?: No  Constitutional Fever: No Night sweats?: No Weight loss?: No Fatigue?: No  Skin Skin rash/lesions?: No Itching?: No  Eyes Blurred vision?: No Double vision?: No  Ears/Nose/Throat Sore throat?: No Sinus problems?: No  Hematologic/Lymphatic Swollen glands?: No Easy bruising?: No  Cardiovascular Leg swelling?: No Chest pain?: No  Respiratory Cough?: No Shortness of breath?: No  Endocrine Excessive thirst?: No  Musculoskeletal Back pain?: No Joint pain?: No  Neurological Headaches?: No Dizziness?: No  Psychologic Depression?: No Anxiety?: No  Physical Exam: BP (!) 143/84 (BP Location: Left Arm, Patient Position: Sitting)   Pulse 72   Ht 5\' 2"  (1.575 m)   Wt 135 lb (61.2 kg)   LMP 04/28/1992   BMI 24.69 kg/m   Constitutional:  Well nourished. Alert and oriented, No acute distress. HEENT: [place] AT, moist mucus membranes.  Trachea midline, no masses. Cardiovascular: No clubbing, cyanosis, or edema. Respiratory: Normal respiratory effort, no increased work of breathing. GI: Abdomen is soft, non tender, non distended, no abdominal masses. Liver and spleen not palpable.  No hernias appreciated.  Stool sample for occult testing is not  indicated.   GU: No CVA tenderness.  No bladder fullness or masses.  Atrophic external genitalia, normal pubic hair distribution, no lesions.  Normal urethral meatus, no lesions, no prolapse, no discharge.   No urethral masses, tenderness and/or tenderness. No bladder fullness, tenderness or masses, pessary in place, pink vagina mucosa, good estrogen effect, no discharge, no lesions, poor pelvic support, Grade II cystocele and grade II rectocele noted.  No cervical motion tenderness.  Uterus is freely mobile and non-fixed.  No adnexal/parametria masses or tenderness noted.  Anus and perineum are without rashes or lesions.     Skin: No rashes, bruises or suspicious lesions. Lymph: No cervical or inguinal adenopathy. Neurologic: Grossly intact, no focal deficits, moving all 4 extremities. Psychiatric: Normal mood and affect.   Laboratory Data: Lab Results  Component Value Date   WBC 6.6 07/26/2018   HGB 14.0 07/26/2018  HCT 41.5 07/26/2018   MCV 88.8 07/26/2018   PLT 239.0 07/26/2018    Lab Results  Component Value Date   CREATININE 0.77 07/26/2018    No results found for: PSA  No results found for: TESTOSTERONE  Lab Results  Component Value Date   HGBA1C 5.8 07/26/2018    Lab Results  Component Value Date   TSH 1.68 07/26/2018       Component Value Date/Time   CHOL 218 (H) 07/26/2018 0830   HDL 63.80 07/26/2018 0830   CHOLHDL 3 07/26/2018 0830   VLDL 23.0 07/26/2018 0830   LDLCALC 131 (H) 07/26/2018 0830    Lab Results  Component Value Date   AST 19 09/22/2018   Lab Results  Component Value Date   ALT 15 09/22/2018   No components found for: ALKALINEPHOPHATASE No components found for: BILIRUBINTOTAL  No results found for: ESTRADIOL  Urinalysis No results found for: COLORURINE, APPEARANCEUR, LABSPEC, PHURINE, GLUCOSEU, HGBUR, BILIRUBINUR, KETONESUR, PROTEINUR, UROBILINOGEN, NITRITE, LEUKOCYTESUR  I have reviewed the labs.  Pessary removed and cleaned with  Betadine and replaced.     Assessment & Plan:    1. Cystocele Managed with a pessary at this time.  She is wondering if she would benefit from a surgical repair.  I explained that she has extensive pelvic floor prolapse and would need to discuss this further with Dr. Matilde Sprang Patient may come to the office for pessary cleaning  2. Vaginal atrophy Minimal bother at this time   Return for to see Dr. Matilde Sprang .  These notes generated with voice recognition software. I apologize for typographical errors.  Zara Council, PA-C  [facility] [address] [place], [place] [address] [phone]

## 2018-09-22 ENCOUNTER — Encounter: Payer: Self-pay | Admitting: Podiatry

## 2018-09-22 ENCOUNTER — Other Ambulatory Visit: Payer: Self-pay

## 2018-09-22 ENCOUNTER — Ambulatory Visit (INDEPENDENT_AMBULATORY_CARE_PROVIDER_SITE_OTHER): Payer: Medicare Other | Admitting: Podiatry

## 2018-09-22 ENCOUNTER — Ambulatory Visit (INDEPENDENT_AMBULATORY_CARE_PROVIDER_SITE_OTHER): Payer: Medicare Other | Admitting: Urology

## 2018-09-22 ENCOUNTER — Encounter: Payer: Self-pay | Admitting: Urology

## 2018-09-22 VITALS — Temp 97.7°F

## 2018-09-22 VITALS — BP 143/84 | HR 72 | Ht 62.0 in | Wt 135.0 lb

## 2018-09-22 DIAGNOSIS — N8111 Cystocele, midline: Secondary | ICD-10-CM

## 2018-09-22 DIAGNOSIS — L603 Nail dystrophy: Secondary | ICD-10-CM | POA: Diagnosis not present

## 2018-09-22 DIAGNOSIS — N952 Postmenopausal atrophic vaginitis: Secondary | ICD-10-CM | POA: Diagnosis not present

## 2018-09-22 DIAGNOSIS — Z79899 Other long term (current) drug therapy: Secondary | ICD-10-CM

## 2018-09-22 MED ORDER — TERBINAFINE HCL 250 MG PO TABS: 250.0000 mg | ORAL_TABLET | Freq: Every day | ORAL | 0 refills | Status: DC

## 2018-09-22 NOTE — Progress Notes (Signed)
She presents today for follow-up of her Lamisil therapy states that she is completed 30 tablets 60 days of an every other day dose and states that she thinks it is doing much better.  She denies any problems taking the medication.  Objective: Vital signs are stable she is alert oriented x3.  Pulses are palpable.  There is no erythema edema cellulitis drainage or odor.  Nails have improved considerably at this point.  Assessment: Well-healing onychomycosis long-term therapy.  Plan: Continue an every other day dose of Lamisil 1 tablet 250 mg every other day follow-up with me in 3 months

## 2018-09-23 LAB — HEPATIC FUNCTION PANEL
ALT: 15 IU/L (ref 0–32)
AST: 19 IU/L (ref 0–40)
Albumin: 4.7 g/dL (ref 3.7–4.7)
Alkaline Phosphatase: 89 IU/L (ref 39–117)
Bilirubin Total: 0.4 mg/dL (ref 0.0–1.2)
Bilirubin, Direct: 0.11 mg/dL (ref 0.00–0.40)
Total Protein: 6.9 g/dL (ref 6.0–8.5)

## 2018-09-27 ENCOUNTER — Ambulatory Visit (INDEPENDENT_AMBULATORY_CARE_PROVIDER_SITE_OTHER): Payer: Medicare Other | Admitting: Urology

## 2018-09-27 ENCOUNTER — Encounter: Payer: Self-pay | Admitting: Urology

## 2018-09-27 ENCOUNTER — Other Ambulatory Visit: Payer: Self-pay

## 2018-09-27 VITALS — BP 144/96 | HR 75 | Ht 62.0 in | Wt 134.0 lb

## 2018-09-27 DIAGNOSIS — N8111 Cystocele, midline: Secondary | ICD-10-CM

## 2018-09-27 NOTE — Progress Notes (Signed)
Pessary clean and put back into place.

## 2018-10-25 ENCOUNTER — Ambulatory Visit (INDEPENDENT_AMBULATORY_CARE_PROVIDER_SITE_OTHER): Payer: Medicare Other | Admitting: Urology

## 2018-10-25 ENCOUNTER — Other Ambulatory Visit: Payer: Self-pay

## 2018-10-25 VITALS — BP 142/78 | HR 82 | Ht 62.0 in | Wt 136.0 lb

## 2018-10-25 DIAGNOSIS — R300 Dysuria: Secondary | ICD-10-CM

## 2018-10-25 DIAGNOSIS — N8111 Cystocele, midline: Secondary | ICD-10-CM

## 2018-10-25 LAB — URINALYSIS, COMPLETE
Bilirubin, UA: NEGATIVE
Glucose, UA: NEGATIVE
Ketones, UA: NEGATIVE
Nitrite, UA: NEGATIVE
Protein,UA: NEGATIVE
Specific Gravity, UA: 1.02 (ref 1.005–1.030)
Urobilinogen, Ur: 0.2 mg/dL (ref 0.2–1.0)
pH, UA: 5 (ref 5.0–7.5)

## 2018-10-25 LAB — MICROSCOPIC EXAMINATION
Epithelial Cells (non renal): >10 /hpf — AB (ref 0–10)
RBC, Urine: NONE SEEN /hpf (ref 0–2)

## 2018-10-25 LAB — BLADDER SCAN AMB NON-IMAGING

## 2018-10-25 NOTE — Progress Notes (Signed)
10/25/2018 4:00 PM   VELVIA MEHRER Dec 24, 1940 [id number]  Referring provider: [name], [address] [place],  [place] [address]  No chief complaint on file.   HPI: Donna Lara: Cystocele with pessary Dr Kenton Kingfisher  The patient has had a pessary for nearly 1 year and gynecology changes it every 3 or 4 months.  She has not had a hysterectomy.  She does not wear a pad and rarely has urge incontinence if she holds it too long.  She voids every 2 hours and gets up once a night.  Flow was reasonable.  She tolerates the pessary really well and she may have mentioned that she might want to be sexually active in the future.  She normally voids every 3-4 hours gets up once a night  Previous bladder surgery kidney stones and bladder infections and has no neurologic issues  Modifying factors: There are no other modifying factors  Associated signs and symptoms: There are no other associated signs and symptoms Aggravating and relieving factors: There are no other aggravating or relieving factors Severity: Moderate Duration: Persistent   PMH: Past Medical History:  Diagnosis Date  . Allergy   . Carpal tunnel syndrome 1999  . GERD (gastroesophageal reflux disease)   . Hypercholesterolemia    By last lipid panel  . Hypertension    Borderline. She does not take the 12.5 HCTZ daily    Surgical History: Past Surgical History:  Procedure Laterality Date  . APPENDECTOMY  1963  . CARPAL TUNNEL RELEASE     right hand  . COLONOSCOPY    . COLONOSCOPY WITH PROPOFOL N/A 12/01/2016   Procedure: COLONOSCOPY WITH PROPOFOL;  Surgeon: Lucilla Lame, MD;  Location: [facility];  Service: Gastroenterology;  Laterality: N/A;  . EYE SURGERY     left  . POLYPECTOMY  12/01/2016   Procedure: POLYPECTOMY INTESTINAL;  Surgeon: Lucilla Lame, MD;  Location: [facility];  Service: Gastroenterology;;  Transverse colon polyp x 2 Ascending colon polyp x 3  . TONSILLECTOMY      Home Medications:  Allergies as of 10/25/2018      Reactions   Codeine Sulfate    Insomnia. Out of control.      Medication List       Accurate as of October 25, 2018  4:00 PM. If you have any questions, ask your nurse or doctor.        ALOE VERA PO Take 1 tablet by mouth daily.   clonazePAM 0.5 MG tablet Commonly known as: KLONOPIN Take 1 tablet (0.5 mg total) by mouth daily as needed for anxiety.   co-enzyme Q-10 30 MG capsule Take 30 mg by mouth 2 (two) times daily.   conjugated estrogens vaginal cream Commonly known as: Premarin Place 1 Applicatorful vaginally daily. Apply 0.5mg  (pea-sized amount)  just inside the vaginal introitus with a finger-tip on  Monday, Wednesday and Friday nights.   CYANOCOBALAMIN PO Take 1 tablet by mouth daily.   estradiol 0.1 MG/GM vaginal cream Commonly known as: ESTRACE VAGINAL Apply 0.5mg  (pea-sized amount)  just inside the vaginal introitus with a finger-tip on Monday, Wednesday and Friday nights.   Fish Oil 1000 MG Caps Take 1,000 mg by mouth 2 (two) times daily.   fluticasone 50 MCG/ACT nasal spray Commonly known as: FLONASE Place 2 sprays into both nostrils daily.   lisinopril 5 MG tablet Commonly known as: ZESTRIL Take 1 tablet (5 mg total) by mouth daily.   magnesium 30 MG tablet Take  30 mg by mouth 1 day or 1 dose.   MAGNESIUM CITRATE PO Take 1 tablet by mouth daily.   meloxicam 15 MG tablet Commonly known as: MOBIC   mometasone 0.1 % lotion Commonly known as: Elocon Apply topically daily. For no more than 7 days.   omega-3 acid ethyl esters 1 g capsule Commonly known as: LOVAZA Take by mouth 3 (three) times daily.   RED YEAST RICE PO Take 2 capsules by mouth daily.   terbinafine 250 MG tablet Commonly known as: LamISIL Take 1 tablet (250 mg total) by mouth daily.   vitamin C 500 MG tablet Commonly known as: ASCORBIC ACID Take 500 mg by mouth 2 (two) times daily.   Vitamin D3 25 MCG (1000 UT) Caps  Take 1,000 Units by mouth 2 (two) times daily.       Allergies:  Allergies  Allergen Reactions  . Codeine Sulfate     Insomnia. Out of control.    Family History: Family History  Problem Relation Age of Onset  . Diabetes Mother   . Hypertension Brother   . Diabetes Maternal Grandmother   . Asthma Daughter   . Breast cancer Other   . Colon cancer Neg Hx     Social History:  reports that she has never smoked. She has never used smokeless tobacco. She reports current alcohol use. She reports that she does not use drugs.  ROS: UROLOGY Frequent Urination?: No Hard to postpone urination?: No Burning/pain with urination?: No Get up at night to urinate?: No Leakage of urine?: No Urine stream starts and stops?: No Trouble starting stream?: No Do you have to strain to urinate?: No Blood in urine?: No Urinary tract infection?: No Sexually transmitted disease?: No Injury to kidneys or bladder?: No Painful intercourse?: No Weak stream?: No Currently pregnant?: No Vaginal bleeding?: No Last menstrual period?: nn  Gastrointestinal Vomiting?: No Indigestion/heartburn?: No Diarrhea?: No Constipation?: No  Constitutional Fever: No Night sweats?: No Weight loss?: No Fatigue?: No  Skin Skin rash/lesions?: No Itching?: No  Eyes Blurred vision?: No Double vision?: No  Ears/Nose/Throat Sore throat?: No Sinus problems?: No  Hematologic/Lymphatic Swollen glands?: No Easy bruising?: No  Cardiovascular Leg swelling?: No Chest pain?: No  Respiratory Cough?: No Shortness of breath?: No  Endocrine Excessive thirst?: No  Musculoskeletal Back pain?: No Joint pain?: No  Neurological Headaches?: No Dizziness?: No  Psychologic Depression?: No Anxiety?: No  Physical Exam: BP (!) 142/78   Pulse 82   Ht 5\' 2"  (1.575 m)   Wt 136 lb (61.7 kg)   LMP 04/28/1992   BMI 24.87 kg/m   Constitutional:  Alert and oriented, No acute distress. HEENT: [place] AT,  moist mucus membranes.  Trachea midline, no masses. Cardiovascular: No clubbing, cyanosis, or edema. Respiratory: Normal respiratory effort, no increased work of breathing. GI: Abdomen is soft, nontender, nondistended, no abdominal masses GU: On pelvic examination I believe she had a diaphragm pessary in the upper vaginal vault holding the apex well.  She had a small grade 2 cystocele not reaching the introitus and a grade 1 rectocele.  She had no stress incontinence.  Tissues were well estrogenized Skin: No rashes, bruises or suspicious lesions. Lymph: No cervical or inguinal adenopathy. Neurologic: Grossly intact, no focal deficits, moving all 4 extremities. Psychiatric: Normal mood and affect.  Laboratory Data: Lab Results  Component Value Date   WBC 6.6 07/26/2018   HGB 14.0 07/26/2018   HCT 41.5 07/26/2018   MCV 88.8 07/26/2018  PLT 239.0 07/26/2018    Lab Results  Component Value Date   CREATININE 0.77 07/26/2018    No results found for: PSA  No results found for: TESTOSTERONE  Lab Results  Component Value Date   HGBA1C 5.8 07/26/2018    Urinalysis No results found for: COLORURINE, APPEARANCEUR, LABSPEC, PHURINE, GLUCOSEU, HGBUR, BILIRUBINUR, KETONESUR, PROTEINUR, UROBILINOGEN, NITRITE, LEUKOCYTESUR  Pertinent Imaging:   Assessment & Plan: Patient has symptomatic prolapse and has mild frequency and nocturia and a rare urge incontinence.  I decided examined the patient and draw her a picture and theoretically discuss prolapse surgery.  She would need a hysterectomy as well.  She understands that I do not do the prolapse surgery here locally  She will think about it.  She likely will speak to Dr. Kenton Kingfisher.  She will follow-up PRN.  If she would like for myself to take part of the prolapse surgery in [place] I would need to refer to a local gynecologist in order urodynamics.  1. Cystocele, midline  - Urinalysis, Complete - Bladder Scan (Post Void Residual) in  office   Return if symptoms worsen or fail to improve.  Reece Packer, MD  [facility] [address] [place], [place] [address] [phone]

## 2018-10-27 LAB — URINE CULTURE

## 2018-10-28 ENCOUNTER — Telehealth: Payer: Self-pay | Admitting: Family Medicine

## 2018-10-28 MED ORDER — NITROFURANTOIN MACROCRYSTAL 100 MG PO CAPS: 100.0000 mg | ORAL_CAPSULE | Freq: Two times a day (BID) | ORAL | 0 refills | Status: DC

## 2018-10-28 NOTE — Telephone Encounter (Signed)
-----   Message from Bjorn Loser, MD sent at 10/28/2018  6:40 AM EDT -----   Macrodantin 100 mg twice a day for 7 days       ----- Message ----- From: Kyra Manges, CMA Sent: 10/27/2018   8:20 AM EDT To: Bjorn Loser, MD   ----- Message ----- From: Interface, [facility] Lab Results In Sent: 10/25/2018   4:36 PM EDT To: Rowe [facility]

## 2018-11-05 ENCOUNTER — Telehealth: Payer: Self-pay | Admitting: Urology

## 2018-11-05 NOTE — Telephone Encounter (Signed)
Pt called office and she just finished 7 days of abx and wants to know if she needs to come back in for another U/A to confirm infection has cleared up.

## 2018-11-05 NOTE — Telephone Encounter (Signed)
Advised patient that we do not typically recheck a UA after antibiotics unless the patient is experiencing symptoms. She states that she feels fine and just wanted to call and make sure she didn't need to come back in. Patient verbalized satisfaction and understanding.

## 2018-11-12 ENCOUNTER — Ambulatory Visit: Payer: Federal, State, Local not specified - PPO

## 2018-11-12 ENCOUNTER — Ambulatory Visit: Payer: Federal, State, Local not specified - PPO | Admitting: Internal Medicine

## 2018-11-15 ENCOUNTER — Encounter: Payer: Self-pay | Admitting: Obstetrics & Gynecology

## 2018-11-15 ENCOUNTER — Ambulatory Visit (INDEPENDENT_AMBULATORY_CARE_PROVIDER_SITE_OTHER): Payer: Medicare Other | Admitting: Obstetrics & Gynecology

## 2018-11-15 ENCOUNTER — Other Ambulatory Visit: Payer: Self-pay

## 2018-11-15 VITALS — BP 130/80 | Ht 62.0 in | Wt 136.0 lb

## 2018-11-15 DIAGNOSIS — N8111 Cystocele, midline: Secondary | ICD-10-CM | POA: Diagnosis not present

## 2018-11-15 DIAGNOSIS — N816 Rectocele: Secondary | ICD-10-CM | POA: Diagnosis not present

## 2018-11-15 DIAGNOSIS — R350 Frequency of micturition: Secondary | ICD-10-CM | POA: Diagnosis not present

## 2018-11-15 DIAGNOSIS — Z87440 Personal history of urinary (tract) infections: Secondary | ICD-10-CM

## 2018-11-15 DIAGNOSIS — N814 Uterovaginal prolapse, unspecified: Secondary | ICD-10-CM

## 2018-11-15 MED ORDER — SULFAMETHOXAZOLE-TRIMETHOPRIM 800-160 MG PO TABS: 1.0000 | ORAL_TABLET | Freq: Two times a day (BID) | ORAL | 1 refills | Status: DC

## 2018-11-15 NOTE — Progress Notes (Signed)
HPI:      [name] is a 78 y.o. [phone] who presents today for her pessary follow up and examination related to her pelvic floor weakening.  Pt reports tolerating the pessary well with  no vaginal bleeding and  no vaginal discharge.  Symptoms of pelvic floor weakening have greatly improved. She is voiding and defecating without difficulty. She currently has a #3 ring type pessary.  PMHx: She  has a past medical history of Allergy, Carpal tunnel syndrome (1999), GERD (gastroesophageal reflux disease), Hypercholesterolemia, and Hypertension. Also,  has a past surgical history that includes Appendectomy (1963); Tonsillectomy; Eye surgery; Carpal tunnel release; Colonoscopy; Colonoscopy with propofol (N/A, 12/01/2016); and Polypectomy (12/01/2016)., family history includes Asthma in her daughter; Breast cancer in an other family member; Diabetes in her maternal grandmother and mother; Hypertension in her brother.,  reports that she has never smoked. She has never used smokeless tobacco. She reports current alcohol use. She reports that she does not use drugs.  She has a current medication list which includes the following prescription(s): aloe vera, vitamin d3, clonazepam, co-enzyme q-10, conjugated estrogens, cyanocobalamin, estradiol, fluticasone, lisinopril, magnesium, magnesium citrate, meloxicam, mometasone, nitrofurantoin, omega-3 acid ethyl esters, fish oil, red yeast rice extract, terbinafine, and vitamin c. Also, is allergic to codeine sulfate.  Review of Systems  All other systems reviewed and are negative.   Objective: BP 130/80   Ht 5\' 2"  (1.575 m)   Wt 136 lb (61.7 kg)   LMP 04/28/1992   BMI 24.87 kg/m  Physical Exam Constitutional:      General: She is not in acute distress.    Appearance: She is well-developed.  Genitourinary:     Pelvic exam was performed with patient supine.     Vagina and uterus normal.     No vaginal erythema or bleeding.     No cervical motion  tenderness, discharge, polyp or nabothian cyst.     Uterus is mobile.     Uterus is not enlarged.     No uterine mass detected.    Uterus is midaxial.     No right or left adnexal mass present.     Right adnexa not tender.     Left adnexa not tender.     Genitourinary Comments: Gr 3 uterine prolapse Gr 2 rectocele and cystocele, esp towards vag apex Mild atrophy  HENT:     Head: Normocephalic and atraumatic.     Nose: Nose normal.  Abdominal:     General: There is no distension.     Palpations: Abdomen is soft.     Tenderness: There is no abdominal tenderness.  Musculoskeletal: Normal range of motion.  Neurological:     Mental Status: She is alert and oriented to person, place, and time.     Cranial Nerves: No cranial nerve deficit.  Skin:    General: Skin is warm and dry.    UA- Leukocytes  Pessary Care Pessary removed and cleaned.  Vagina checked - without erosions - pessary replaced.  A/P:1. Cystocele, midline 2. Uterine prolapse 3. Rectocele Pessary was cleaned and replaced today. Instructions given for care. Concerning symptoms to observe for are counseled to patient. Follow up scheduled for 3 months.  U Culture and tx of UTI w Bactrim    She has had prior UTI.  UA today w leukocytes.  A total of 15 minutes were spent face-to-face with the patient during this encounter and over half of that time dealt with counseling and coordination  of care.  Barnett Applebaum, MD, [facility], [facility] 11/15/2018  11:23 AM

## 2018-11-17 LAB — URINE CULTURE

## 2018-11-24 ENCOUNTER — Ambulatory Visit (INDEPENDENT_AMBULATORY_CARE_PROVIDER_SITE_OTHER): Payer: Medicare Other | Admitting: Internal Medicine

## 2018-11-24 ENCOUNTER — Other Ambulatory Visit: Payer: Self-pay

## 2018-11-24 ENCOUNTER — Encounter: Payer: Self-pay | Admitting: Internal Medicine

## 2018-11-24 DIAGNOSIS — N812 Incomplete uterovaginal prolapse: Secondary | ICD-10-CM

## 2018-11-24 DIAGNOSIS — Z9109 Other allergy status, other than to drugs and biological substances: Secondary | ICD-10-CM

## 2018-11-24 DIAGNOSIS — R739 Hyperglycemia, unspecified: Secondary | ICD-10-CM | POA: Diagnosis not present

## 2018-11-24 DIAGNOSIS — I10 Essential (primary) hypertension: Secondary | ICD-10-CM | POA: Diagnosis not present

## 2018-11-24 DIAGNOSIS — E7800 Pure hypercholesterolemia, unspecified: Secondary | ICD-10-CM | POA: Diagnosis not present

## 2018-11-24 NOTE — Progress Notes (Signed)
[name], female   DOB: 06-Oct-1940, 78 y.o.   MRN: [id number]   Virtual Visit via Telephone Note  This visit type was conducted due to national recommendations for restrictions regarding the COVID-19 pandemic (e.g. social distancing).  This format is felt to be most appropriate for this patient at this time.  All issues noted in this document were discussed and addressed.  No physical exam was performed (except for noted visual exam findings with Video Visits).   I connected with Donna Lara by telephone and verified that I am speaking with the correct person using two identifiers. Location patient: home Location provider: work Persons participating in the virtual visit: patient, provider  I discussed the limitations, risks, security and privacy concerns of performing an evaluation and management service by telephone and the availability of in person appointments. The patient expressed understanding and agreed to proceed.   Reason for visit: scheduled follow up.   HPI: She reports she is doing relatively well.  Seeing Dr Kenton Kingfisher for f/u cystocele, uterine prolapse and rectocele.  Has pessary.  Also followed by urology.  Stays active.  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  No chest pain.  No sob.  No acid reflux.  No abdominal pain.  Bowels moving.  flonase controls her allergy symptoms.  Had colonoscopy 11/2016.  Recommended f/u in 5 years.     ROS: See pertinent positives and negatives per HPI.  Past Medical History:  Diagnosis Date  . Allergy   . Carpal tunnel syndrome 1999  . GERD (gastroesophageal reflux disease)   . Hypercholesterolemia    By last lipid panel  . Hypertension    Borderline. She does not take the 12.5 HCTZ daily    Past Surgical History:  Procedure Laterality Date  . APPENDECTOMY  1963  . CARPAL TUNNEL RELEASE     right hand  . COLONOSCOPY    . COLONOSCOPY WITH PROPOFOL N/A 12/01/2016   Procedure: COLONOSCOPY WITH PROPOFOL;  Surgeon: Lucilla Lame, MD;   Location: [facility];  Service: Gastroenterology;  Laterality: N/A;  . EYE SURGERY     left  . POLYPECTOMY  12/01/2016   Procedure: POLYPECTOMY INTESTINAL;  Surgeon: Lucilla Lame, MD;  Location: [facility];  Service: Gastroenterology;;  Transverse colon polyp x 2 Ascending colon polyp x 3  . TONSILLECTOMY      Family History  Problem Relation Age of Onset  . Diabetes Mother   . Hypertension Brother   . Diabetes Maternal Grandmother   . Asthma Daughter   . Breast cancer Other   . Colon cancer Neg Hx     SOCIAL HX: reviewed.     Current Outpatient Medications:  .  ALOE VERA PO, Take 1 tablet by mouth daily. , Disp: , Rfl:  .  Cholecalciferol (VITAMIN D3) 1000 UNITS CAPS, Take 1,000 Units by mouth 2 (two) times daily. , Disp: , Rfl:  .  clonazePAM (KLONOPIN) 0.5 MG tablet, Take 1 tablet (0.5 mg total) by mouth daily as needed for anxiety., Disp: 30 tablet, Rfl: 1 .  co-enzyme Q-10 30 MG capsule, Take 30 mg by mouth 2 (two) times daily., Disp: , Rfl:  .  conjugated estrogens (PREMARIN) vaginal cream, Place 1 Applicatorful vaginally daily. Apply 0.42m (pea-sized amount)  just inside the vaginal introitus with a finger-tip on  Monday, Wednesday and Friday nights., Disp: 30 g, Rfl: 12 .  CYANOCOBALAMIN PO, Take 1 tablet by mouth daily. , Disp: , Rfl:  .  estradiol (ESTRACE  VAGINAL) 0.1 MG/GM vaginal cream, Apply 0.60m (pea-sized amount)  just inside the vaginal introitus with a finger-tip on Monday, Wednesday and Friday nights., Disp: 30 g, Rfl: 12 .  fluticasone (FLONASE) 50 MCG/ACT nasal spray, Place 2 sprays into both nostrils daily., Disp: 16 g, Rfl: 6 .  lisinopril (PRINIVIL,ZESTRIL) 5 MG tablet, Take 1 tablet (5 mg total) by mouth daily., Disp: 90 tablet, Rfl: 0 .  magnesium 30 MG tablet, Take 30 mg by mouth 1 day or 1 dose., Disp: , Rfl:  .  MAGNESIUM CITRATE PO, Take 1 tablet by mouth daily. , Disp: , Rfl:  .  mometasone (ELOCON) 0.1 % lotion, Apply topically  daily. For no more than 7 days., Disp: 30 mL, Rfl: 0 .  nitrofurantoin (MACRODANTIN) 100 MG capsule, Take 1 capsule (100 mg total) by mouth 2 (two) times daily., Disp: 14 capsule, Rfl: 0 .  omega-3 acid ethyl esters (LOVAZA) 1 g capsule, Take by mouth 3 (three) times daily., Disp: , Rfl:  .  Omega-3 Fatty Acids (FISH OIL) 1000 MG CAPS, Take 1,000 mg by mouth 2 (two) times daily. , Disp: , Rfl:  .  Red Yeast Rice Extract (RED YEAST RICE PO), Take 2 capsules by mouth daily., Disp: , Rfl:  .  sulfamethoxazole-trimethoprim (BACTRIM DS) 800-160 MG tablet, Take 1 tablet by mouth 2 (two) times daily., Disp: 14 tablet, Rfl: 1 .  terbinafine (LAMISIL) 250 MG tablet, Take 1 tablet (250 mg total) by mouth daily., Disp: 30 tablet, Rfl: 0 .  vitamin C (ASCORBIC ACID) 500 MG tablet, Take 500 mg by mouth 2 (two) times daily., Disp: , Rfl:   EXAM:  GENERAL: alert. Sounds to be in no acute distress.  Answering questions appropriately.    PSYCH/NEURO: pleasant and cooperative, no obvious depression or anxiety, speech and thought processing grossly intact  ASSESSMENT AND PLAN:  Discussed the following assessment and plan:  Cystocele with incomplete uterovaginal prolapse Has pessary.  Followed by gyn and urology.    Environmental allergies Controlled with flonase.    Hypercholesterolemia Off crestor and desires not to restart.  Low cholesterol diet and exercise.  Follow lipid panel.   Hyperglycemia Low carb diet and exercise.  Follow met b and a1c.    Hypertension Blood pressure has been doing well.  Continue current medication regimen.  Follow pressures.  Follow metabolic panel.     I discussed the assessment and treatment plan with the patient. The patient was provided an opportunity to ask questions and all were answered. The patient agreed with the plan and demonstrated an understanding of the instructions.   The patient was advised to call back or seek an in-person evaluation if the symptoms  worsen or if the condition fails to improve as anticipated.  I provided 15 minutes of non-face-to-face time during this encounter.   CEinar Pheasant MD

## 2018-11-28 ENCOUNTER — Encounter: Payer: Self-pay | Admitting: Internal Medicine

## 2018-11-28 NOTE — Assessment & Plan Note (Signed)
Off crestor and desires not to restart.  Low cholesterol diet and exercise.  Follow lipid panel.

## 2018-11-28 NOTE — Assessment & Plan Note (Signed)
Has pessary.  Followed by gyn and urology.

## 2018-11-28 NOTE — Assessment & Plan Note (Signed)
Controlled with flonase.

## 2018-11-28 NOTE — Assessment & Plan Note (Signed)
Blood pressure has been doing well.  Continue current medication regimen.  Follow pressures.  Follow metabolic panel.  

## 2018-11-28 NOTE — Assessment & Plan Note (Signed)
Low carb diet and exercise.  Follow met b and a1c.

## 2018-12-07 ENCOUNTER — Other Ambulatory Visit: Payer: Self-pay | Admitting: Obstetrics & Gynecology

## 2018-12-07 ENCOUNTER — Ambulatory Visit (INDEPENDENT_AMBULATORY_CARE_PROVIDER_SITE_OTHER): Payer: Medicare Other

## 2018-12-07 DIAGNOSIS — R300 Dysuria: Secondary | ICD-10-CM

## 2018-12-07 LAB — POCT URINALYSIS DIPSTICK
Bilirubin, UA: NEGATIVE
Blood, UA: NEGATIVE
Glucose, UA: NEGATIVE
Ketones, UA: NEGATIVE
Nitrite, UA: NEGATIVE
Protein, UA: NEGATIVE
Spec Grav, UA: 1.01 (ref 1.010–1.025)
Urobilinogen, UA: 0.2 E.U./dL
pH, UA: 5 (ref 5.0–8.0)

## 2018-12-07 MED ORDER — SULFAMETHOXAZOLE-TRIMETHOPRIM 800-160 MG PO TABS: 1.0000 | ORAL_TABLET | Freq: Two times a day (BID) | ORAL | 1 refills | Status: DC

## 2018-12-07 NOTE — Progress Notes (Signed)
Is this a dropped off specimen?  Appears to still have UTI.  WIll eRx renewed ABX for treatment.

## 2018-12-07 NOTE — Progress Notes (Signed)
Yes, do you want to send in a culture?

## 2018-12-09 LAB — URINE CULTURE

## 2018-12-14 ENCOUNTER — Other Ambulatory Visit: Payer: Self-pay | Admitting: Internal Medicine

## 2018-12-14 DIAGNOSIS — G4700 Insomnia, unspecified: Secondary | ICD-10-CM

## 2018-12-14 DIAGNOSIS — F419 Anxiety disorder, unspecified: Secondary | ICD-10-CM

## 2018-12-22 ENCOUNTER — Other Ambulatory Visit: Payer: Self-pay

## 2018-12-22 ENCOUNTER — Ambulatory Visit: Payer: Federal, State, Local not specified - PPO | Admitting: Podiatry

## 2018-12-22 ENCOUNTER — Ambulatory Visit: Payer: Medicare Other | Admitting: Podiatry

## 2018-12-23 ENCOUNTER — Ambulatory Visit: Payer: Medicare Other | Admitting: Urology

## 2018-12-27 NOTE — Progress Notes (Signed)
12/28/2018 9:17 AM   Donna Lara 06-Mar-1941 [id number]  Referring provider: [name], MD [id number] [place],  [place] [address]  Chief Complaint  Patient presents with  . cystocele    HPI: Patient is a 78 year old female with a cystocele and vaginal atrophy who presents today for a cleaning of her pessary.    Cystocele Was seen by Dr. Matilde Sprang on 10/25/2018 and if she is wanting to pursue prolapse surgery, she will need to have it in [place].  She will need UDS prior to the surgery as well.  She is undecided at this time whether or not she would like to proceed.  She continues with the pessary at this time.  She was treated recently for an UTI by Dr. Kenton Kingfisher.   Vaginal atrophy Not using vaginal estrogen cream at this time.  Not complaining of vaginal dryness or irritation.    Nocturia Patient complains of nocturia x 1.   PMH: Past Medical History:  Diagnosis Date  . Allergy   . Carpal tunnel syndrome 1999  . GERD (gastroesophageal reflux disease)   . Hypercholesterolemia    By last lipid panel  . Hypertension    Borderline. She does not take the 12.5 HCTZ daily    Surgical History: Past Surgical History:  Procedure Laterality Date  . APPENDECTOMY  1963  . CARPAL TUNNEL RELEASE     right hand  . COLONOSCOPY    . COLONOSCOPY WITH PROPOFOL N/A 12/01/2016   Procedure: COLONOSCOPY WITH PROPOFOL;  Surgeon: Lucilla Lame, MD;  Location: [facility];  Service: Gastroenterology;  Laterality: N/A;  . EYE SURGERY     left  . POLYPECTOMY  12/01/2016   Procedure: POLYPECTOMY INTESTINAL;  Surgeon: Lucilla Lame, MD;  Location: [facility];  Service: Gastroenterology;;  Transverse colon polyp x 2 Ascending colon polyp x 3  . TONSILLECTOMY      Home Medications:  Allergies as of 12/28/2018      Reactions   Codeine Sulfate    Insomnia. Out of control.      Medication List       Accurate as of December 28, 2018 11:59 PM.  If you have any questions, ask your nurse or doctor.        ALOE VERA PO Take 1 tablet by mouth daily.   clonazePAM 0.5 MG tablet Commonly known as: KLONOPIN Take 1 tablet (0.5 mg total) by mouth daily as needed for anxiety.   co-enzyme Q-10 30 MG capsule Take 30 mg by mouth 2 (two) times daily.   conjugated estrogens vaginal cream Commonly known as: Premarin Place 1 Applicatorful vaginally daily. Apply 0.5mg  (pea-sized amount)  just inside the vaginal introitus with a finger-tip on  Monday, Wednesday and Friday nights.   CYANOCOBALAMIN PO Take 1 tablet by mouth daily.   estradiol 0.1 MG/GM vaginal cream Commonly known as: ESTRACE VAGINAL Apply 0.5mg  (pea-sized amount)  just inside the vaginal introitus with a finger-tip on Monday, Wednesday and Friday nights.   Fish Oil 1000 MG Caps Take 1,000 mg by mouth 2 (two) times daily.   fluticasone 50 MCG/ACT nasal spray Commonly known as: FLONASE Place 2 sprays into both nostrils daily.   lisinopril 5 MG tablet Commonly known as: ZESTRIL Take 1 tablet (5 mg total) by mouth daily.   magnesium 30 MG tablet Take 30 mg by mouth 1 day or 1 dose.   MAGNESIUM CITRATE PO Take 1 tablet by mouth daily.   mometasone 0.1 %  lotion Commonly known as: Elocon Apply topically daily. For no more than 7 days.   omega-3 acid ethyl esters 1 g capsule Commonly known as: LOVAZA Take by mouth 3 (three) times daily.   RED YEAST RICE PO Take 2 capsules by mouth daily.   sulfamethoxazole-trimethoprim 800-160 MG tablet Commonly known as: BACTRIM DS Take 1 tablet by mouth 2 (two) times daily.   terbinafine 250 MG tablet Commonly known as: LamISIL Take 1 tablet (250 mg total) by mouth daily.   vitamin C 500 MG tablet Commonly known as: ASCORBIC ACID Take 500 mg by mouth 2 (two) times daily.   Vitamin D3 25 MCG (1000 UT) Caps Take 1,000 Units by mouth 2 (two) times daily.       Allergies:  Allergies  Allergen Reactions  . Codeine  Sulfate     Insomnia. Out of control.    Family History: Family History  Problem Relation Age of Onset  . Diabetes Mother   . Hypertension Brother   . Diabetes Maternal Grandmother   . Asthma Daughter   . Breast cancer Other   . Colon cancer Neg Hx     Social History:  reports that she has never smoked. She has never used smokeless tobacco. She reports current alcohol use. She reports that she does not use drugs.  ROS: UROLOGY Frequent Urination?: No Hard to postpone urination?: No Burning/pain with urination?: No Get up at night to urinate?: Yes Leakage of urine?: No Urine stream starts and stops?: No Trouble starting stream?: No Do you have to strain to urinate?: No Blood in urine?: No Urinary tract infection?: No Sexually transmitted disease?: No Injury to kidneys or bladder?: No Painful intercourse?: No Weak stream?: No Currently pregnant?: No Vaginal bleeding?: No Last menstrual period?: n  Gastrointestinal Nausea?: No Vomiting?: No Indigestion/heartburn?: No Diarrhea?: No Constipation?: No  Constitutional Fever: No Night sweats?: No Weight loss?: No Fatigue?: No  Skin Skin rash/lesions?: No Itching?: No  Eyes Blurred vision?: No Double vision?: No  Ears/Nose/Throat Sore throat?: No Sinus problems?: No  Hematologic/Lymphatic Swollen glands?: No Easy bruising?: No  Cardiovascular Leg swelling?: No Chest pain?: No  Respiratory Cough?: No Shortness of breath?: No  Endocrine Excessive thirst?: No  Musculoskeletal Back pain?: No Joint pain?: No  Neurological Headaches?: No Dizziness?: No  Psychologic Depression?: No Anxiety?: No  Physical Exam: BP (!) 114/58 (BP Location: Left Arm, Patient Position: Sitting, Cuff Size: Normal)   Pulse 78   Ht 5\' 2"  (1.575 m)   Wt 136 lb (61.7 kg)   LMP 04/28/1992   BMI 24.87 kg/m   Constitutional:  Well nourished. Alert and oriented, No acute distress. HEENT:  AT, moist mucus  membranes.  Trachea midline, no masses. Cardiovascular: No clubbing, cyanosis, or edema. Respiratory: Normal respiratory effort, no increased work of breathing. GI: Abdomen is soft, non tender, non distended, no abdominal masses. Liver and spleen not palpable.  No hernias appreciated.  Stool sample for occult testing is not indicated.   GU: No CVA tenderness.  No bladder fullness or masses.  Atrophic external genitalia, sparse pubic hair distribution, no lesions.  Normal urethral meatus, no lesions, no prolapse, no discharge.   No urethral masses, tenderness and/or tenderness. No bladder fullness, tenderness or masses. pale vagina mucosa, fair estrogen effect, no discharge, no lesions, poor pelvic support, grade III cystocele and grade II rectocele noted.  No cervical motion tenderness.  Uterus is freely mobile and non-fixed.  No adnexal/parametria masses or tenderness noted.  Anus  and perineum are without rashes or lesions.    Skin: No rashes, bruises or suspicious lesions. Lymph: No inguinal adenopathy. Neurologic: Grossly intact, no focal deficits, moving all 4 extremities. Psychiatric: Normal mood and affect.   Laboratory Data: Lab Results  Component Value Date   WBC 6.6 07/26/2018   HGB 14.0 07/26/2018   HCT 41.5 07/26/2018   MCV 88.8 07/26/2018   PLT 239.0 07/26/2018    Lab Results  Component Value Date   CREATININE 0.77 07/26/2018    No results found for: PSA  No results found for: TESTOSTERONE  Lab Results  Component Value Date   HGBA1C 5.8 07/26/2018    Lab Results  Component Value Date   TSH 1.68 07/26/2018       Component Value Date/Time   CHOL 218 (H) 07/26/2018 0830   HDL 63.80 07/26/2018 0830   CHOLHDL 3 07/26/2018 0830   VLDL 23.0 07/26/2018 0830   LDLCALC 131 (H) 07/26/2018 0830    Lab Results  Component Value Date   AST 19 09/22/2018   Lab Results  Component Value Date   ALT 15 09/22/2018   No components found for: ALKALINEPHOPHATASE No  components found for: BILIRUBINTOTAL  No results found for: ESTRADIOL  Urinalysis    Component Value Date/Time   APPEARANCEUR Cloudy (A) 10/25/2018 1524   GLUCOSEU Negative 10/25/2018 1524   BILIRUBINUR neg 12/07/2018 1020   BILIRUBINUR Negative 10/25/2018 1524   PROTEINUR Negative 12/07/2018 1020   PROTEINUR Negative 10/25/2018 1524   UROBILINOGEN 0.2 12/07/2018 1020   NITRITE neg 12/07/2018 1020   NITRITE Negative 10/25/2018 1524   LEUKOCYTESUR Large (3+) (A) 12/07/2018 1020   LEUKOCYTESUR 3+ (A) 10/25/2018 1524    I have reviewed the labs.  Procedure #3 ring pessary is removed and inspected.  It is then cleansed with Betadine and replaced into the vaginal vault without difficulty.     Assessment & Plan:    1. Cystocele Continue management with #3 ring pessary RTC in three months for pessary cleaning or RT Dr. Kenton Kingfisher for cleaning  2. Vaginal atrophy Minimal bother at this time   Return in about 3 months (around 03/29/2019) for Pessary cleaning .  These notes generated with voice recognition software. I apologize for typographical errors.  Zara Council, PA-C  [facility] [address] [place], [place] [address] [phone]

## 2018-12-28 ENCOUNTER — Other Ambulatory Visit: Payer: Self-pay

## 2018-12-28 ENCOUNTER — Encounter: Payer: Self-pay | Admitting: Urology

## 2018-12-28 ENCOUNTER — Ambulatory Visit (INDEPENDENT_AMBULATORY_CARE_PROVIDER_SITE_OTHER): Payer: Medicare Other | Admitting: Urology

## 2018-12-28 VITALS — BP 114/58 | HR 78 | Ht 62.0 in | Wt 136.0 lb

## 2018-12-28 DIAGNOSIS — R351 Nocturia: Secondary | ICD-10-CM

## 2018-12-28 DIAGNOSIS — N952 Postmenopausal atrophic vaginitis: Secondary | ICD-10-CM | POA: Diagnosis not present

## 2018-12-28 DIAGNOSIS — N8111 Cystocele, midline: Secondary | ICD-10-CM

## 2019-01-03 ENCOUNTER — Other Ambulatory Visit
Admission: RE | Admit: 2019-01-03 | Discharge: 2019-01-03 | Disposition: A | Discharge status: Home / Self Care | Payer: Medicare Other | Source: Ambulatory Visit | Attending: Urology | Admitting: Urology

## 2019-01-03 DIAGNOSIS — N8111 Cystocele, midline: Secondary | ICD-10-CM | POA: Insufficient documentation

## 2019-01-03 LAB — URINALYSIS, COMPLETE (UACMP) WITH MICROSCOPIC
Bacteria, UA: NONE SEEN
Bilirubin Urine: NEGATIVE
Glucose, UA: NEGATIVE mg/dL
Hgb urine dipstick: NEGATIVE
Ketones, ur: NEGATIVE mg/dL
Nitrite: NEGATIVE
Protein, ur: NEGATIVE mg/dL
Specific Gravity, Urine: 1.018 (ref 1.005–1.030)
pH: 5 (ref 5.0–8.0)

## 2019-01-04 ENCOUNTER — Telehealth: Payer: Self-pay

## 2019-01-04 NOTE — Telephone Encounter (Signed)
Patient notified and scheduled for cath ua on nurse schedule

## 2019-01-04 NOTE — Telephone Encounter (Signed)
-----   Message from Nori Riis, PA-C sent at 01/04/2019  7:38 AM EDT ----- Please let Mrs. Devin know that her urine sample had RBC's and WBC's, but she also had a lot of squamous epithelial cells in the sample which means it was contaminated.  We need to make sure she doesn't have microscopic blood in her urine.  I suggest either taking the pessary out prior to giving the sample or cathing for a sample to get a non contaminated specimen.

## 2019-01-06 ENCOUNTER — Other Ambulatory Visit: Payer: Self-pay

## 2019-01-06 ENCOUNTER — Ambulatory Visit (INDEPENDENT_AMBULATORY_CARE_PROVIDER_SITE_OTHER): Payer: Medicare Other

## 2019-01-06 DIAGNOSIS — R300 Dysuria: Secondary | ICD-10-CM | POA: Diagnosis not present

## 2019-01-06 LAB — URINALYSIS, COMPLETE
Bilirubin, UA: NEGATIVE
Glucose, UA: NEGATIVE
Ketones, UA: NEGATIVE
Leukocytes,UA: NEGATIVE
Nitrite, UA: NEGATIVE
Protein,UA: NEGATIVE
RBC, UA: NEGATIVE
Specific Gravity, UA: 1.02 (ref 1.005–1.030)
Urobilinogen, Ur: 0.2 mg/dL (ref 0.2–1.0)
pH, UA: 7.5 (ref 5.0–7.5)

## 2019-01-06 LAB — MICROSCOPIC EXAMINATION
Bacteria, UA: NONE SEEN
RBC, Urine: NONE SEEN /hpf (ref 0–2)
WBC, UA: NONE SEEN /hpf (ref 0–5)

## 2019-01-06 NOTE — Progress Notes (Signed)
In and Out Catheterization  Patient is present today for a I & O catheterization due to previous contaminated specimen. Patient was cleaned and prepped in a sterile fashion with betadine.  A 14FR cath was inserted no complications were noted , 8ml of urine return was noted, urine was yellow in color. A clean urine sample was collected for urinaylsis. Bladder was drained  And catheter was removed with out difficulty.    Preformed by: Gordy Clement, CMA ([facility])  Follow up/ Additional notes: Will call with results.

## 2019-01-07 ENCOUNTER — Telehealth: Payer: Self-pay | Admitting: Family Medicine

## 2019-01-07 NOTE — Telephone Encounter (Signed)
-----   Message from Nori Riis, PA-C sent at 01/06/2019  5:03 PM EDT ----- Please let Mrs. Kross know that her urine was clear.

## 2019-01-07 NOTE — Telephone Encounter (Signed)
Mailbox was full - unable to leave message. 

## 2019-01-10 NOTE — Telephone Encounter (Signed)
Pt called back and I read her the UA results. She voiced understanding.

## 2019-01-12 ENCOUNTER — Encounter: Payer: Self-pay | Admitting: Podiatry

## 2019-01-12 ENCOUNTER — Ambulatory Visit (INDEPENDENT_AMBULATORY_CARE_PROVIDER_SITE_OTHER): Payer: Medicare Other | Admitting: Podiatry

## 2019-01-12 ENCOUNTER — Other Ambulatory Visit: Payer: Self-pay

## 2019-01-12 DIAGNOSIS — L603 Nail dystrophy: Secondary | ICD-10-CM | POA: Diagnosis not present

## 2019-01-12 NOTE — Progress Notes (Signed)
She presents today for follow-up of her onychomycosis she is been taking Lamisil every other day.  States that it had gone away completely but now it has reoccurred and still taking the Lamisil every other day.  Objective: Also stable alert and oriented x3.  Pulses are palpable.  Hallux nail plate left does demonstrate mild white discoloration as opposed to yellow discoloration.  It does appear that this is subungual as I tried scraping some of it off today it would not come off.  Assessment: Most likely subungual yeast infection or something like that that is not healed by Lamisil.  Plan: I recommended laser therapy until this can grow out enough to be tested.

## 2019-01-24 DIAGNOSIS — Z85828 Personal history of other malignant neoplasm of skin: Secondary | ICD-10-CM | POA: Diagnosis not present

## 2019-01-24 DIAGNOSIS — B351 Tinea unguium: Secondary | ICD-10-CM | POA: Diagnosis not present

## 2019-01-24 DIAGNOSIS — Z08 Encounter for follow-up examination after completed treatment for malignant neoplasm: Secondary | ICD-10-CM | POA: Diagnosis not present

## 2019-01-24 DIAGNOSIS — D2271 Melanocytic nevi of right lower limb, including hip: Secondary | ICD-10-CM | POA: Diagnosis not present

## 2019-01-24 DIAGNOSIS — D2272 Melanocytic nevi of left lower limb, including hip: Secondary | ICD-10-CM | POA: Diagnosis not present

## 2019-01-24 DIAGNOSIS — D2262 Melanocytic nevi of left upper limb, including shoulder: Secondary | ICD-10-CM | POA: Diagnosis not present

## 2019-01-24 DIAGNOSIS — D225 Melanocytic nevi of trunk: Secondary | ICD-10-CM | POA: Diagnosis not present

## 2019-01-24 DIAGNOSIS — D2261 Melanocytic nevi of right upper limb, including shoulder: Secondary | ICD-10-CM | POA: Diagnosis not present

## 2019-01-24 DIAGNOSIS — L821 Other seborrheic keratosis: Secondary | ICD-10-CM | POA: Diagnosis not present

## 2019-01-28 ENCOUNTER — Other Ambulatory Visit: Payer: Self-pay

## 2019-01-28 ENCOUNTER — Ambulatory Visit: Payer: Self-pay

## 2019-01-28 DIAGNOSIS — L603 Nail dystrophy: Secondary | ICD-10-CM

## 2019-01-28 DIAGNOSIS — B351 Tinea unguium: Secondary | ICD-10-CM

## 2019-01-31 NOTE — Progress Notes (Signed)
Pt presents with mycotic infection of nails 1-5 bilateral.  All other systems are negative  Laser therapy administered to affected nails and tolerated well. All safety precautions were in place.  1st treatment.  Follow up in 4 weeks     

## 2019-02-09 ENCOUNTER — Ambulatory Visit: Payer: Federal, State, Local not specified - PPO | Admitting: Podiatry

## 2019-02-15 ENCOUNTER — Encounter: Payer: Self-pay | Admitting: Obstetrics & Gynecology

## 2019-02-15 ENCOUNTER — Other Ambulatory Visit: Payer: Self-pay

## 2019-02-15 ENCOUNTER — Ambulatory Visit (INDEPENDENT_AMBULATORY_CARE_PROVIDER_SITE_OTHER): Payer: Medicare Other | Admitting: Obstetrics & Gynecology

## 2019-02-15 VITALS — BP 140/80 | Ht 62.0 in | Wt 137.0 lb

## 2019-02-15 DIAGNOSIS — N816 Rectocele: Secondary | ICD-10-CM | POA: Diagnosis not present

## 2019-02-15 DIAGNOSIS — N814 Uterovaginal prolapse, unspecified: Secondary | ICD-10-CM | POA: Diagnosis not present

## 2019-02-15 DIAGNOSIS — N8111 Cystocele, midline: Secondary | ICD-10-CM

## 2019-02-15 NOTE — Progress Notes (Signed)
HPI:      [name] is a 78 y.o. [phone] who presents today for her pessary follow up and examination related to her pelvic floor weakening.  Pt reports tolerating the pessary well with  no vaginal bleeding and  no vaginal discharge.  Symptoms of pelvic floor weakening have greatly improved. She is voiding and defecating without difficulty. She currently has a Ring3 pessary.  PMHx: She  has a past medical history of Allergy, Carpal tunnel syndrome (1999), GERD (gastroesophageal reflux disease), Hypercholesterolemia, and Hypertension. Also,  has a past surgical history that includes Appendectomy (1963); Tonsillectomy; Eye surgery; Carpal tunnel release; Colonoscopy; Colonoscopy with propofol (N/A, 12/01/2016); and Polypectomy (12/01/2016)., family history includes Asthma in her daughter; Breast cancer in an other family member; Diabetes in her maternal grandmother and mother; Hypertension in her brother.,  reports that she has never smoked. She has never used smokeless tobacco. She reports current alcohol use. She reports that she does not use drugs.  She has a current medication list which includes the following prescription(s): aloe vera, vitamin d3, clonazepam, co-enzyme q-10, conjugated estrogens, cyanocobalamin, estradiol, fluticasone, lisinopril, magnesium, magnesium citrate, mometasone, omega-3 acid ethyl esters, fish oil, red yeast rice extract, sulfamethoxazole-trimethoprim, and vitamin c. Also, is allergic to codeine sulfate.  Review of Systems  All other systems reviewed and are negative.   Objective: BP 140/80   Ht 5\' 2"  (1.575 m)   Wt 137 lb (62.1 kg)   LMP 04/28/1992   BMI 25.06 kg/m  Physical Exam Constitutional:      General: She is not in acute distress.    Appearance: She is well-developed.  Genitourinary:     Pelvic exam was performed with patient supine.     Vagina and uterus normal.     No vaginal erythema or bleeding.     No cervical motion tenderness, discharge,  polyp or nabothian cyst.     Uterus is mobile.     Uterus is not enlarged.     No uterine mass detected.    Uterus is midaxial.     No right or left adnexal mass present.     Right adnexa not tender.     Left adnexa not tender.     Genitourinary Comments: Gr 3 uterine prolapse Gr 2 rectocele and cystocele, esp towards vag apex Mild atrophy   HENT:     Head: Normocephalic and atraumatic.     Nose: Nose normal.  Abdominal:     General: There is no distension.     Palpations: Abdomen is soft.     Tenderness: There is no abdominal tenderness.  Musculoskeletal: Normal range of motion.  Neurological:     Mental Status: She is alert and oriented to person, place, and time.     Cranial Nerves: No cranial nerve deficit.  Skin:    General: Skin is warm and dry.  Psychiatric:        Attention and Perception: Attention normal.        Mood and Affect: Mood and affect normal.        Speech: Speech normal.        Behavior: Behavior normal.        Thought Content: Thought content normal.        Judgment: Judgment normal.     Pessary Care Pessary removed and cleaned.  Vagina checked - without erosions - pessary replaced.  A/P:   ICD-10-CM   1. Cystocele, midline  N81.11   2. Uterine prolapse  N81.4   3. Rectocele  N81.6    Pessary was cleaned and replaced today. Instructions given for care. Concerning symptoms to observe for are counseled to patient. Follow up scheduled for 3 months.  A total of 15 minutes were spent face-to-face with the patient during this encounter and over half of that time dealt with counseling and coordination of care.  Barnett Applebaum, MD, [facility], [facility] 02/15/2019  9:52 AM

## 2019-02-25 ENCOUNTER — Other Ambulatory Visit: Payer: Self-pay

## 2019-02-25 ENCOUNTER — Ambulatory Visit: Payer: Self-pay | Admitting: *Deleted

## 2019-02-25 DIAGNOSIS — L603 Nail dystrophy: Secondary | ICD-10-CM

## 2019-02-25 DIAGNOSIS — B351 Tinea unguium: Secondary | ICD-10-CM

## 2019-02-25 NOTE — Progress Notes (Signed)
Patient presents today for the 2nd laser treatment. Diagnosed with mycotic nail infection by Dr. Milinda Pointer. Toenail most affected was the hallux left.  All other systems are negative.  Nails were filed thin. Laser therapy was administered to the hallux toenail left and patient tolerated the treatment well. All safety precautions were in place.   Follow up in 4 weeks for laser # 3.

## 2019-03-25 ENCOUNTER — Other Ambulatory Visit: Payer: Medicare Other

## 2019-04-04 ENCOUNTER — Other Ambulatory Visit: Payer: Self-pay

## 2019-04-04 ENCOUNTER — Ambulatory Visit (INDEPENDENT_AMBULATORY_CARE_PROVIDER_SITE_OTHER): Payer: Medicare Other | Admitting: *Deleted

## 2019-04-04 DIAGNOSIS — L603 Nail dystrophy: Secondary | ICD-10-CM

## 2019-04-04 DIAGNOSIS — B351 Tinea unguium: Secondary | ICD-10-CM

## 2019-04-04 NOTE — Progress Notes (Signed)
Patient presents today for the 3rd laser treatment. Diagnosed with mycotic nail infection by Dr. Milinda Pointer. Toenail most affected was the hallux left. She says its looking better.  All other systems are negative.  Nails were filed thin. Laser therapy was administered to the hallux toenail left and patient tolerated the treatment well. All safety precautions were in place.   Follow up in 4 weeks for laser # 4.

## 2019-05-02 ENCOUNTER — Other Ambulatory Visit: Payer: Self-pay

## 2019-05-02 ENCOUNTER — Ambulatory Visit (INDEPENDENT_AMBULATORY_CARE_PROVIDER_SITE_OTHER): Payer: Self-pay | Admitting: *Deleted

## 2019-05-02 DIAGNOSIS — B351 Tinea unguium: Secondary | ICD-10-CM

## 2019-05-02 DIAGNOSIS — L603 Nail dystrophy: Secondary | ICD-10-CM

## 2019-05-02 NOTE — Progress Notes (Signed)
Patient presents today for the 4th laser treatment. Diagnosed with mycotic nail infection by Dr. Milinda Pointer. Toenail most affected was the hallux left. Patient is happy with the progress so far.  All other systems are negative.  Nails were filed thin. Laser therapy was administered to the hallux toenail left and patient tolerated the treatment well. All safety precautions were in place.   Follow up in 4 weeks for laser # 5.

## 2019-05-04 DIAGNOSIS — L821 Other seborrheic keratosis: Secondary | ICD-10-CM | POA: Diagnosis not present

## 2019-05-04 DIAGNOSIS — D485 Neoplasm of uncertain behavior of skin: Secondary | ICD-10-CM | POA: Diagnosis not present

## 2019-05-04 DIAGNOSIS — C44319 Basal cell carcinoma of skin of other parts of face: Secondary | ICD-10-CM | POA: Diagnosis not present

## 2019-05-12 ENCOUNTER — Other Ambulatory Visit: Payer: Self-pay | Admitting: Internal Medicine

## 2019-05-12 DIAGNOSIS — Z1231 Encounter for screening mammogram for malignant neoplasm of breast: Secondary | ICD-10-CM

## 2019-05-19 ENCOUNTER — Ambulatory Visit: Payer: Medicare Other | Admitting: Obstetrics & Gynecology

## 2019-05-24 ENCOUNTER — Ambulatory Visit (INDEPENDENT_AMBULATORY_CARE_PROVIDER_SITE_OTHER): Payer: Medicare Other | Admitting: Obstetrics & Gynecology

## 2019-05-24 ENCOUNTER — Other Ambulatory Visit: Payer: Self-pay

## 2019-05-24 ENCOUNTER — Encounter: Payer: Self-pay | Admitting: Obstetrics & Gynecology

## 2019-05-24 VITALS — BP 138/80 | Ht 62.0 in | Wt 137.0 lb

## 2019-05-24 DIAGNOSIS — N816 Rectocele: Secondary | ICD-10-CM | POA: Diagnosis not present

## 2019-05-24 DIAGNOSIS — N8111 Cystocele, midline: Secondary | ICD-10-CM | POA: Diagnosis not present

## 2019-05-24 DIAGNOSIS — N814 Uterovaginal prolapse, unspecified: Secondary | ICD-10-CM

## 2019-05-24 NOTE — Progress Notes (Signed)
HPI:      [name] is a 79 y.o. [phone] who presents today for her pessary follow up and examination related to her pelvic floor weakening.  Pt reports tolerating the pessary well with  no vaginal bleeding and occas vaginal discharge more so at the end of the 3 mos she is seen here.  Symptoms of pelvic floor weakening have greatly improved. She is voiding and defecating without difficulty. She currently has a Ring #3 pessary.  PMHx: She  has a past medical history of Allergy, Carpal tunnel syndrome (1999), GERD (gastroesophageal reflux disease), Hypercholesterolemia, and Hypertension. Also,  has a past surgical history that includes Appendectomy (1963); Tonsillectomy; Eye surgery; Carpal tunnel release; Colonoscopy; Colonoscopy with propofol (N/A, 12/01/2016); and Polypectomy (12/01/2016)., family history includes Asthma in her daughter; Breast cancer in an other family member; Diabetes in her maternal grandmother and mother; Hypertension in her brother.,  reports that she has never smoked. She has never used smokeless tobacco. She reports current alcohol use. She reports that she does not use drugs.  She has a current medication list which includes the following prescription(s): aloe vera, vitamin d3, clonazepam, co-enzyme q-10, conjugated estrogens, cyanocobalamin, estradiol, fluticasone, lisinopril, magnesium, magnesium citrate, mometasone, omega-3 acid ethyl esters, fish oil, red yeast rice extract, sulfamethoxazole-trimethoprim, and vitamin c. Also, is allergic to codeine sulfate.  Review of Systems  All other systems reviewed and are negative.   Objective: BP 138/80   Ht 5\' 2"  (1.575 m)   Wt 137 lb (62.1 kg)   LMP 04/28/1992   BMI 25.06 kg/m  Physical Exam Constitutional:      General: She is not in acute distress.    Appearance: She is well-developed.  Genitourinary:     Pelvic exam was performed with patient supine.     Vagina and uterus normal.     No vaginal erythema or  bleeding.     No cervical motion tenderness, discharge, polyp or nabothian cyst.     Uterus is mobile.     Uterus is not enlarged.     No uterine mass detected.    Uterus is midaxial.     No right or left adnexal mass present.     Right adnexa not tender.     Left adnexa not tender.     Genitourinary Comments:  Gr 3 uterine prolapse Gr 2 rectocele and cystocele, esp towards vag apex Mild atrophy    HENT:     Head: Normocephalic and atraumatic.     Nose: Nose normal.  Abdominal:     General: There is no distension.     Palpations: Abdomen is soft.     Tenderness: There is no abdominal tenderness.  Musculoskeletal:        General: Normal range of motion.  Neurological:     Mental Status: She is alert and oriented to person, place, and time.     Cranial Nerves: No cranial nerve deficit.  Skin:    General: Skin is warm and dry.  Psychiatric:        Attention and Perception: Attention normal.        Mood and Affect: Mood and affect normal.        Speech: Speech normal.        Behavior: Behavior normal.        Thought Content: Thought content normal.        Judgment: Judgment normal.     Pessary Care Pessary removed and cleaned.  Vagina checked -  without erosions - pessary replaced.  A/P:   ICD-10-CM   1. Uterine prolapse  N81.4   2. Cystocele, midline  N81.11   3. Rectocele  N81.6    Pessary was cleaned and replaced today. Instructions given for care. Concerning symptoms to observe for are counseled to patient. Follow up scheduled for 3 months.  A total of 20 minutes were spent face-to-face with the patient as well as preparation, review, communication, and documentation during this encounter.   Barnett Applebaum, MD, [facility], [facility] 05/24/2019  11:33 AM

## 2019-05-25 DIAGNOSIS — C44319 Basal cell carcinoma of skin of other parts of face: Secondary | ICD-10-CM | POA: Diagnosis not present

## 2019-05-30 ENCOUNTER — Other Ambulatory Visit: Payer: Medicare Other

## 2019-06-02 ENCOUNTER — Telehealth: Payer: Self-pay | Admitting: Internal Medicine

## 2019-06-02 NOTE — Telephone Encounter (Signed)
Left message for patient to call back and schedule Medicare Annual Wellness Visit (AWV) either virtually or audio only.  Last AWV 8.6.19; please schedule at anytime with Denisa O'Brien-Blaney at [facility].

## 2019-06-06 ENCOUNTER — Ambulatory Visit (INDEPENDENT_AMBULATORY_CARE_PROVIDER_SITE_OTHER): Payer: Medicare Other | Admitting: *Deleted

## 2019-06-06 ENCOUNTER — Other Ambulatory Visit: Payer: Self-pay

## 2019-06-06 DIAGNOSIS — L603 Nail dystrophy: Secondary | ICD-10-CM

## 2019-06-06 NOTE — Progress Notes (Signed)
Patient presents today for the 5th laser treatment. Diagnosed with mycotic nail infection by Dr. Milinda Pointer. Toenail most affected was the hallux left. She is very pleased with the progress her nail has made.  All other systems are negative.  Nails were filed thin. Laser therapy was administered to the hallux toenail left and patient tolerated the treatment well. All safety precautions were in place.   Follow up in 4 weeks for laser # 6.

## 2019-06-13 ENCOUNTER — Other Ambulatory Visit: Payer: Self-pay | Admitting: Internal Medicine

## 2019-06-13 ENCOUNTER — Ambulatory Visit
Admission: RE | Admit: 2019-06-13 | Discharge: 2019-06-13 | Disposition: A | Discharge status: Home / Self Care | Payer: Medicare Other | Source: Ambulatory Visit | Attending: Internal Medicine | Admitting: Internal Medicine

## 2019-06-13 DIAGNOSIS — N6310 Unspecified lump in the right breast, unspecified quadrant: Secondary | ICD-10-CM

## 2019-06-13 DIAGNOSIS — Z1231 Encounter for screening mammogram for malignant neoplasm of breast: Secondary | ICD-10-CM | POA: Insufficient documentation

## 2019-06-13 DIAGNOSIS — R928 Other abnormal and inconclusive findings on diagnostic imaging of breast: Secondary | ICD-10-CM

## 2019-06-14 ENCOUNTER — Other Ambulatory Visit: Payer: Self-pay | Admitting: Internal Medicine

## 2019-06-14 DIAGNOSIS — G4700 Insomnia, unspecified: Secondary | ICD-10-CM

## 2019-06-14 DIAGNOSIS — F419 Anxiety disorder, unspecified: Secondary | ICD-10-CM

## 2019-06-16 NOTE — Telephone Encounter (Signed)
Refill request for klonopin, last seen 11-28-18, last filled 12-15-18.  Please advise.

## 2019-06-17 ENCOUNTER — Other Ambulatory Visit: Payer: Self-pay | Admitting: Internal Medicine

## 2019-06-17 DIAGNOSIS — F419 Anxiety disorder, unspecified: Secondary | ICD-10-CM

## 2019-06-17 DIAGNOSIS — G4700 Insomnia, unspecified: Secondary | ICD-10-CM

## 2019-06-17 NOTE — Telephone Encounter (Signed)
Please schedule follow up

## 2019-06-17 NOTE — Telephone Encounter (Signed)
Called and scheduled pt for 07/12/19 @ 11:30

## 2019-06-17 NOTE — Telephone Encounter (Signed)
rx ok'd for clonazepam #30 with on refills.  Needs f/u appt scheduled.

## 2019-07-06 ENCOUNTER — Ambulatory Visit
Admission: RE | Admit: 2019-07-06 | Discharge: 2019-07-06 | Disposition: A | Discharge status: Home / Self Care | Payer: Medicare Other | Source: Ambulatory Visit | Attending: Internal Medicine | Admitting: Internal Medicine

## 2019-07-06 DIAGNOSIS — R928 Other abnormal and inconclusive findings on diagnostic imaging of breast: Secondary | ICD-10-CM

## 2019-07-06 DIAGNOSIS — N6310 Unspecified lump in the right breast, unspecified quadrant: Secondary | ICD-10-CM

## 2019-07-06 DIAGNOSIS — N6311 Unspecified lump in the right breast, upper outer quadrant: Secondary | ICD-10-CM | POA: Diagnosis not present

## 2019-07-11 ENCOUNTER — Other Ambulatory Visit: Payer: Self-pay | Admitting: Internal Medicine

## 2019-07-11 ENCOUNTER — Other Ambulatory Visit: Payer: Self-pay

## 2019-07-11 ENCOUNTER — Ambulatory Visit (INDEPENDENT_AMBULATORY_CARE_PROVIDER_SITE_OTHER): Payer: Medicare Other | Admitting: *Deleted

## 2019-07-11 DIAGNOSIS — B351 Tinea unguium: Secondary | ICD-10-CM

## 2019-07-11 DIAGNOSIS — N6310 Unspecified lump in the right breast, unspecified quadrant: Secondary | ICD-10-CM

## 2019-07-11 DIAGNOSIS — L603 Nail dystrophy: Secondary | ICD-10-CM

## 2019-07-11 DIAGNOSIS — R928 Other abnormal and inconclusive findings on diagnostic imaging of breast: Secondary | ICD-10-CM

## 2019-07-11 NOTE — Progress Notes (Signed)
Patient presents today for the 6th laser treatment. Diagnosed with mycotic nail infection by Dr. Milinda Pointer.   Toenail most affected was the hallux left. She is happy with the way the toenail is looking.   All other systems are negative.  Nails were filed thin. Laser therapy was administered to the hallux toenail left and patient tolerated the treatment well. All safety precautions were in place.   Patient has completed the recommended laser treatments. He will follow up with Dr. Milinda Pointer in 3 months to evaluate progress.   Final picture of nails taken today

## 2019-07-12 ENCOUNTER — Ambulatory Visit (INDEPENDENT_AMBULATORY_CARE_PROVIDER_SITE_OTHER): Payer: Medicare Other | Admitting: Internal Medicine

## 2019-07-12 ENCOUNTER — Other Ambulatory Visit: Payer: Self-pay

## 2019-07-12 DIAGNOSIS — F439 Reaction to severe stress, unspecified: Secondary | ICD-10-CM

## 2019-07-12 DIAGNOSIS — F419 Anxiety disorder, unspecified: Secondary | ICD-10-CM | POA: Diagnosis not present

## 2019-07-12 DIAGNOSIS — R739 Hyperglycemia, unspecified: Secondary | ICD-10-CM | POA: Diagnosis not present

## 2019-07-12 DIAGNOSIS — E7800 Pure hypercholesterolemia, unspecified: Secondary | ICD-10-CM | POA: Diagnosis not present

## 2019-07-12 DIAGNOSIS — R928 Other abnormal and inconclusive findings on diagnostic imaging of breast: Secondary | ICD-10-CM | POA: Diagnosis not present

## 2019-07-12 DIAGNOSIS — N812 Incomplete uterovaginal prolapse: Secondary | ICD-10-CM | POA: Diagnosis not present

## 2019-07-12 DIAGNOSIS — I10 Essential (primary) hypertension: Secondary | ICD-10-CM | POA: Diagnosis not present

## 2019-07-12 DIAGNOSIS — Z9109 Other allergy status, other than to drugs and biological substances: Secondary | ICD-10-CM

## 2019-07-12 NOTE — Progress Notes (Signed)
[name], female   DOB: 1940/06/25, 79 y.o.   MRN: [id number]   Subjective:    [name], female    DOB: [birth year], 79 y.o.   MRN: [id number]  HPI  This visit occurred during the SARS-CoV-2 public health emergency.  Safety protocols were in place, including screening questions prior to the visit, additional usage of staff PPE, and extensive cleaning of exam room while observing appropriate contact time as indicated for disinfecting solutions.  Patient here for a scheduled follow up.  She reports she is doing relatively well.  Staying active.  No chest pain.  Breathing stable.  No cough or congestion.  No acid reflux.  No abdominal pain.  Some constipation.  Not a significant issue for her.   Stress is better.  Sees gyn - pessary in place.  Had colonoscopy 11/2016.  Recommended f/u in 5 years.    Past Medical History:  Diagnosis Date  . Allergy   . Carpal tunnel syndrome 1999  . GERD (gastroesophageal reflux disease)   . Hypercholesterolemia    By last lipid panel  . Hypertension    Borderline. She does not take the 12.5 HCTZ daily   Past Surgical History:  Procedure Laterality Date  . APPENDECTOMY  1963  . CARPAL TUNNEL RELEASE     right hand  . COLONOSCOPY    . COLONOSCOPY WITH PROPOFOL N/A 12/01/2016   Procedure: COLONOSCOPY WITH PROPOFOL;  Surgeon: Lucilla Lame, MD;  Location: [facility];  Service: Gastroenterology;  Laterality: N/A;  . EYE SURGERY     left  . POLYPECTOMY  12/01/2016   Procedure: POLYPECTOMY INTESTINAL;  Surgeon: Lucilla Lame, MD;  Location: [facility];  Service: Gastroenterology;;  Transverse colon polyp x 2 Ascending colon polyp x 3  . TONSILLECTOMY     Family History  Problem Relation Age of Onset  . Diabetes Mother   . Hypertension Brother   . Diabetes Maternal Grandmother   . Asthma Daughter   . Breast cancer Other   . Colon cancer Neg Hx    Social History   Socioeconomic History  . Marital status: Married     Spouse name: Not on file  . Number of children: 3  . Years of education: Not on file  . Highest education level: Not on file  Occupational History    Employer: OTHER  Tobacco Use  . Smoking status: Never Smoker  . Smokeless tobacco: Never Used  Substance and Sexual Activity  . Alcohol use: Yes    Alcohol/week: 0.0 standard drinks    Comment: 2-3 drinks/month  . Drug use: No  . Sexual activity: Never  Other Topics Concern  . Not on file  Social History Narrative   Recently widowed. Her husband died of a heart attack in 05-29-2015   Social Determinants of Health   Financial Resource Strain:   . Difficulty of Paying Living Expenses:   Food Insecurity:   . Worried About Charity fundraiser in the Last Year:   . Arboriculturist in the Last Year:   Transportation Needs:   . Film/video editor (Medical):   Marland Kitchen Lack of Transportation (Non-Medical):   Physical Activity:   . Days of Exercise per Week:   . Minutes of Exercise per Session:   Stress:   . Feeling of Stress :   Social Connections:   . Frequency of Communication with Friends and Family:   . Frequency of Social  Gatherings with Friends and Family:   . Attends Religious Services:   . Active Member of Clubs or Organizations:   . Attends Archivist Meetings:   Marland Kitchen Marital Status:     Outpatient Encounter Medications as of 07/12/2019  Medication Sig  . ALOE VERA PO Take 1 tablet by mouth daily.   . Cholecalciferol (VITAMIN D3) 1000 UNITS CAPS Take 1,000 Units by mouth 2 (two) times daily.   . clonazePAM (KLONOPIN) 0.5 MG tablet Take 1 tablet (0.5 mg total) by mouth daily as needed for anxiety.  Marland Kitchen co-enzyme Q-10 30 MG capsule Take 30 mg by mouth 2 (two) times daily.  Marland Kitchen conjugated estrogens (PREMARIN) vaginal cream Place 1 Applicatorful vaginally daily. Apply 0.26m (pea-sized amount)  just inside the vaginal introitus with a finger-tip on  Monday, Wednesday and Friday nights.  . CYANOCOBALAMIN PO Take 1  tablet by mouth daily.   .Marland Kitchenestradiol (ESTRACE VAGINAL) 0.1 MG/GM vaginal cream Apply 0.536m(pea-sized amount)  just inside the vaginal introitus with a finger-tip on Monday, Wednesday and Friday nights.  . fluticasone (FLONASE) 50 MCG/ACT nasal spray Place 2 sprays into both nostrils daily.  . Marland Kitchenisinopril (PRINIVIL,ZESTRIL) 5 MG tablet Take 1 tablet (5 mg total) by mouth daily.  . magnesium 30 MG tablet Take 30 mg by mouth 1 day or 1 dose.  . Marland KitchenAGNESIUM CITRATE PO Take 1 tablet by mouth daily.   . mometasone (ELOCON) 0.1 % lotion Apply topically daily. For no more than 7 days.  . Marland Kitchenmega-3 acid ethyl esters (LOVAZA) 1 g capsule Take by mouth 3 (three) times daily.  . Omega-3 Fatty Acids (FISH OIL) 1000 MG CAPS Take 1,000 mg by mouth 2 (two) times daily.   . Red Yeast Rice Extract (RED YEAST RICE PO) Take 2 capsules by mouth daily.  . Marland Kitchenulfamethoxazole-trimethoprim (BACTRIM DS) 800-160 MG tablet Take 1 tablet by mouth 2 (two) times daily.  . vitamin C (ASCORBIC ACID) 500 MG tablet Take 500 mg by mouth 2 (two) times daily.   No facility-administered encounter medications on file as of 07/12/2019.    Review of Systems  Constitutional: Negative for appetite change and unexpected weight change.  HENT: Negative for congestion and sinus pressure.   Respiratory: Negative for cough, chest tightness and shortness of breath.   Cardiovascular: Negative for chest pain, palpitations and leg swelling.  Gastrointestinal: Negative for abdominal pain, diarrhea, nausea and vomiting.  Genitourinary: Negative for difficulty urinating and dysuria.  Musculoskeletal: Negative for joint swelling and myalgias.  Skin: Negative for color change and rash.  Neurological: Negative for dizziness, light-headedness and headaches.  Psychiatric/Behavioral: Negative for agitation and dysphoric mood.       Objective:    Physical Exam Vitals reviewed.  Constitutional:      Appearance: Normal appearance. She is well-developed.    HENT:     Head: Normocephalic and atraumatic.     Right Ear: External ear normal.     Left Ear: External ear normal.  Eyes:     General: No scleral icterus.       Right eye: No discharge.        Left eye: No discharge.     Conjunctiva/sclera: Conjunctivae normal.  Neck:     Thyroid: No thyromegaly.  Cardiovascular:     Rate and Rhythm: Normal rate and regular rhythm.  Pulmonary:     Effort: No tachypnea, accessory muscle usage or respiratory distress.     Breath sounds: Normal breath sounds. No decreased breath  sounds or wheezing.  Chest:     Breasts:        Right: No inverted nipple, mass, nipple discharge or tenderness (no axillary adenopathy).        Left: No inverted nipple, mass, nipple discharge or tenderness (no axilarry adenopathy).  Abdominal:     General: Bowel sounds are normal.     Palpations: Abdomen is soft.     Tenderness: There is no abdominal tenderness.  Musculoskeletal:        General: No swelling or tenderness.     Cervical back: Neck supple. No tenderness.  Lymphadenopathy:     Cervical: No cervical adenopathy.  Skin:    Findings: No erythema or rash.  Neurological:     Mental Status: She is alert and oriented to person, place, and time.  Psychiatric:        Mood and Affect: Mood normal.        Behavior: Behavior normal.     BP 136/78   Pulse 90   Temp 97.8 F (36.6 C)   Resp 16   Ht 5' 2"  (1.575 m)   Wt 137 lb (62.1 kg)   LMP 04/28/1992   SpO2 98%   BMI 25.06 kg/m  Wt Readings from Last 3 Encounters:  07/12/19 137 lb (62.1 kg)  05/24/19 137 lb (62.1 kg)  02/15/19 137 lb (62.1 kg)     Lab Results  Component Value Date   WBC 6.6 07/26/2018   HGB 14.0 07/26/2018   HCT 41.5 07/26/2018   PLT 239.0 07/26/2018   GLUCOSE 96 07/26/2018   CHOL 218 (H) 07/26/2018   TRIG 115.0 07/26/2018   HDL 63.80 07/26/2018   LDLCALC 131 (H) 07/26/2018   ALT 15 09/22/2018   AST 19 09/22/2018   NA 138 07/26/2018   K 4.0 07/26/2018   CL 103  07/26/2018   CREATININE 0.77 07/26/2018   BUN 14 07/26/2018   CO2 28 07/26/2018   TSH 1.68 07/26/2018   HGBA1C 5.8 07/26/2018    US BREAST LTD UNI RIGHT INC AXILLA  Result Date: 07/06/2019 CLINICAL DATA:  Recall from screening mammography with tomosynthesis, possible mass involving the UPPER OUTER subareolar RIGHT breast. EXAM: DIGITAL DIAGNOSTIC RIGHT MAMMOGRAM WITH CAD AND TOMO ULTRASOUND RIGHT BREAST COMPARISON:  Previous exam(s). ACR Breast Density Category b: There are scattered areas of fibroglandular density. FINDINGS: Tomosynthesis and synthesized spot-compression CC and MLO views of the area of concern in the RIGHT breast and a tomosynthesis and synthesized full field mediolateral view of the RIGHT breast were obtained. Spot compression images confirm a partially obscured isodense mass in the UPPER OUTER subareolar location measuring approximately 4-5 mm without associated architectural distortion or suspicious calcifications. The visible margins are circumscribed. No suspicious findings elsewhere in the RIGHT breast on the full field mediolateral images. The full field mediolateral image was processed with CAD. On correlative physical exam, there is no palpable abnormality in the UPPER OUTER subareolar location of the RIGHT breast. Targeted RIGHT breast ultrasound is performed, showing an oval parallel nearly anechoic mass with scattered internal echoes at the 11 o'clock subareolar location with predominantly circumscribed margins, though the MEDIAL margin has a somewhat vague and angular configuration. The mass measures approximately 4 x 5 x 5 mm and demonstrates posterior acoustic enhancement and no internal power Doppler flow. Sonographic evaluation of the RIGHT axilla demonstrates no pathologic lymphadenopathy. IMPRESSION: 1. New 5 mm mass in the UPPER OUTER subareolar location of the RIGHT breast, indeterminate, but likely a benign  complex cyst. 2. No pathologic RIGHT axillary  lymphadenopathy. RECOMMENDATION: Ultrasound-guided aspiration of the likely complex cyst involving the UPPER OUTER subareolar location of the RIGHT breast, given that this is a new mass. If the presumed complex cyst does not completely aspirate, then ultrasound-guided core needle biopsy can be performed at that time. The ultrasound-guided aspiration and biopsy procedures were discussed with the patient and her questions were answered. She wishes to proceed. She will be contacted by the breast navigator at [facility] and the aspiration procedure will be scheduled. I have discussed the findings and recommendations with the patient. BI-RADS CATEGORY  3: Probably benign. Electronically Signed   By: Evangeline Dakin M.D.   On: 07/06/2019 10:08   MM DIAG BREAST TOMO UNI RIGHT  Result Date: 07/06/2019 CLINICAL DATA:  Recall from screening mammography with tomosynthesis, possible mass involving the UPPER OUTER subareolar RIGHT breast. EXAM: DIGITAL DIAGNOSTIC RIGHT MAMMOGRAM WITH CAD AND TOMO ULTRASOUND RIGHT BREAST COMPARISON:  Previous exam(s). ACR Breast Density Category b: There are scattered areas of fibroglandular density. FINDINGS: Tomosynthesis and synthesized spot-compression CC and MLO views of the area of concern in the RIGHT breast and a tomosynthesis and synthesized full field mediolateral view of the RIGHT breast were obtained. Spot compression images confirm a partially obscured isodense mass in the UPPER OUTER subareolar location measuring approximately 4-5 mm without associated architectural distortion or suspicious calcifications. The visible margins are circumscribed. No suspicious findings elsewhere in the RIGHT breast on the full field mediolateral images. The full field mediolateral image was processed with CAD. On correlative physical exam, there is no palpable abnormality in the UPPER OUTER subareolar location of the RIGHT breast. Targeted RIGHT breast ultrasound is performed, showing  an oval parallel nearly anechoic mass with scattered internal echoes at the 11 o'clock subareolar location with predominantly circumscribed margins, though the MEDIAL margin has a somewhat vague and angular configuration. The mass measures approximately 4 x 5 x 5 mm and demonstrates posterior acoustic enhancement and no internal power Doppler flow. Sonographic evaluation of the RIGHT axilla demonstrates no pathologic lymphadenopathy. IMPRESSION: 1. New 5 mm mass in the UPPER OUTER subareolar location of the RIGHT breast, indeterminate, but likely a benign complex cyst. 2. No pathologic RIGHT axillary lymphadenopathy. RECOMMENDATION: Ultrasound-guided aspiration of the likely complex cyst involving the UPPER OUTER subareolar location of the RIGHT breast, given that this is a new mass. If the presumed complex cyst does not completely aspirate, then ultrasound-guided core needle biopsy can be performed at that time. The ultrasound-guided aspiration and biopsy procedures were discussed with the patient and her questions were answered. She wishes to proceed. She will be contacted by the breast navigator at [facility] and the aspiration procedure will be scheduled. I have discussed the findings and recommendations with the patient. BI-RADS CATEGORY  3: Probably benign. Electronically Signed   By: Evangeline Dakin M.D.   On: 07/06/2019 10:08       Assessment & Plan:   Problem List Items Addressed This Visit    Abnormal mammogram    Abnormal mammogram - due to be scheduled for aspiration.  Follow.       Anxiety    Stress is better.  Overall handling things relatively well.  Follow.        Cystocele with incomplete uterovaginal prolapse    Has pessary.  Followed by gyn.        Environmental allergies    Controlled.        Hypercholesterolemia  Has tried crestor.  Desired not to restart.  Low cholesterol diet and exercise.  Follow lipid panel and liver function tests.         Hyperglycemia    Low carb diet and exercise.  Follow met b and a1c.       Hypertension    Blood pressure as outlined.  On lisinopril.  Have her spot check her pressure.  Follow metabolic panel.       Stress    Stress is better.  Follow.            [name], MD

## 2019-07-13 ENCOUNTER — Ambulatory Visit
Admission: RE | Admit: 2019-07-13 | Discharge: 2019-07-13 | Disposition: A | Discharge status: Home / Self Care | Payer: Medicare Other | Source: Ambulatory Visit | Attending: Internal Medicine | Admitting: Internal Medicine

## 2019-07-13 DIAGNOSIS — R928 Other abnormal and inconclusive findings on diagnostic imaging of breast: Secondary | ICD-10-CM | POA: Insufficient documentation

## 2019-07-13 DIAGNOSIS — N6310 Unspecified lump in the right breast, unspecified quadrant: Secondary | ICD-10-CM | POA: Insufficient documentation

## 2019-07-13 DIAGNOSIS — N6001 Solitary cyst of right breast: Secondary | ICD-10-CM | POA: Diagnosis not present

## 2019-07-17 ENCOUNTER — Encounter: Payer: Self-pay | Admitting: Internal Medicine

## 2019-07-17 DIAGNOSIS — R928 Other abnormal and inconclusive findings on diagnostic imaging of breast: Secondary | ICD-10-CM | POA: Insufficient documentation

## 2019-07-17 NOTE — Assessment & Plan Note (Signed)
Blood pressure as outlined.  On lisinopril.  Have her spot check her pressure.  Follow metabolic panel.

## 2019-07-17 NOTE — Assessment & Plan Note (Signed)
Stress is better.  Follow.

## 2019-07-17 NOTE — Assessment & Plan Note (Signed)
Abnormal mammogram - due to be scheduled for aspiration.  Follow.

## 2019-07-17 NOTE — Assessment & Plan Note (Signed)
Controlled.  

## 2019-07-17 NOTE — Assessment & Plan Note (Signed)
Has pessary.  Followed by gyn.

## 2019-07-17 NOTE — Assessment & Plan Note (Signed)
Has tried crestor.  Desired not to restart.  Low cholesterol diet and exercise.  Follow lipid panel and liver function tests.

## 2019-07-17 NOTE — Assessment & Plan Note (Signed)
Stress is better.  Overall handling things relatively well.  Follow.

## 2019-07-17 NOTE — Assessment & Plan Note (Signed)
Low carb diet and exercise.  Follow met b and a1c.  

## 2019-07-27 ENCOUNTER — Other Ambulatory Visit: Payer: Federal, State, Local not specified - PPO

## 2019-08-11 ENCOUNTER — Other Ambulatory Visit (INDEPENDENT_AMBULATORY_CARE_PROVIDER_SITE_OTHER): Payer: Medicare Other

## 2019-08-11 ENCOUNTER — Other Ambulatory Visit: Payer: Self-pay

## 2019-08-11 DIAGNOSIS — E7800 Pure hypercholesterolemia, unspecified: Secondary | ICD-10-CM

## 2019-08-11 DIAGNOSIS — R739 Hyperglycemia, unspecified: Secondary | ICD-10-CM

## 2019-08-11 DIAGNOSIS — I10 Essential (primary) hypertension: Secondary | ICD-10-CM

## 2019-08-11 LAB — BASIC METABOLIC PANEL
BUN: 18 mg/dL (ref 6–23)
CO2: 26 mEq/L (ref 19–32)
Calcium: 9.2 mg/dL (ref 8.4–10.5)
Chloride: 106 mEq/L (ref 96–112)
Creatinine, Ser: 0.82 mg/dL (ref 0.40–1.20)
GFR: 67.69 mL/min (ref >60.00)
Glucose, Bld: 87 mg/dL (ref 70–99)
Potassium: 3.9 mEq/L (ref 3.5–5.1)
Sodium: 138 mEq/L (ref 135–145)

## 2019-08-11 LAB — LIPID PANEL
Cholesterol: 197 mg/dL (ref 0–200)
HDL: 58.4 mg/dL (ref >39.00)
LDL Cholesterol: 118 mg/dL — ABNORMAL HIGH (ref 0–99)
NonHDL: 138.84
Total CHOL/HDL Ratio: 3
Triglycerides: 105 mg/dL (ref 0.0–149.0)
VLDL: 21 mg/dL (ref 0.0–40.0)

## 2019-08-11 LAB — HEMOGLOBIN A1C: Hgb A1c MFr Bld: 5.7 % (ref 4.6–6.5)

## 2019-08-11 LAB — HEPATIC FUNCTION PANEL
ALT: 12 U/L (ref 0–35)
AST: 18 U/L (ref 0–37)
Albumin: 4.1 g/dL (ref 3.5–5.2)
Alkaline Phosphatase: 89 U/L (ref 39–117)
Bilirubin, Direct: 0.1 mg/dL (ref 0.0–0.3)
Total Bilirubin: 0.6 mg/dL (ref 0.2–1.2)
Total Protein: 6.8 g/dL (ref 6.0–8.3)

## 2019-08-22 ENCOUNTER — Ambulatory Visit (INDEPENDENT_AMBULATORY_CARE_PROVIDER_SITE_OTHER): Payer: Medicare Other | Admitting: Obstetrics & Gynecology

## 2019-08-22 ENCOUNTER — Encounter: Payer: Self-pay | Admitting: Obstetrics & Gynecology

## 2019-08-22 ENCOUNTER — Other Ambulatory Visit: Payer: Self-pay

## 2019-08-22 VITALS — BP 120/80 | Ht 62.0 in | Wt 134.0 lb

## 2019-08-22 DIAGNOSIS — N816 Rectocele: Secondary | ICD-10-CM

## 2019-08-22 DIAGNOSIS — N8111 Cystocele, midline: Secondary | ICD-10-CM | POA: Diagnosis not present

## 2019-08-22 DIAGNOSIS — N814 Uterovaginal prolapse, unspecified: Secondary | ICD-10-CM | POA: Diagnosis not present

## 2019-08-22 NOTE — Progress Notes (Signed)
HPI:      [name] is a 79 y.o. [phone] who presents today for her pessary follow up and examination related to her pelvic floor weakening.  Pt reports tolerating the pessary well with no vaginal bleeding and some vaginal discharge especially towards end of 3 mos of coming here to clean and replace pessary.  Symptoms of pelvic floor weakening have greatly improved with the pessary. She is voiding and defecating without difficulty, other than urinary urgency and need to void soon after urge to avoid spasm or leakage (leakage is rare). She currently has a #3 Ring Type pessary.  PMHx: She  has a past medical history of Allergy, Carpal tunnel syndrome (1999), GERD (gastroesophageal reflux disease), Hypercholesterolemia, and Hypertension. Also,  has a past surgical history that includes Appendectomy (1963); Tonsillectomy; Eye surgery; Carpal tunnel release; Colonoscopy; Colonoscopy with propofol (N/A, 12/01/2016); and Polypectomy (12/01/2016)., family history includes Asthma in her daughter; Breast cancer in an other family member; Diabetes in her maternal grandmother and mother; Hypertension in her brother.,  reports that she has never smoked. She has never used smokeless tobacco. She reports current alcohol use. She reports that she does not use drugs.  She has a current medication list which includes the following prescription(s): aloe vera, vitamin d3, clonazepam, co-enzyme q-10, conjugated estrogens, cyanocobalamin, estradiol, fluticasone, lisinopril, magnesium, magnesium citrate, mometasone, omega-3 acid ethyl esters, fish oil, red yeast rice extract, sulfamethoxazole-trimethoprim, and vitamin c. Also, is allergic to codeine sulfate.  Review of Systems  All other systems reviewed and are negative.   Objective: BP 120/80   Ht 5\' 2"  (1.575 m)   Wt 134 lb (60.8 kg)   LMP 04/28/1992   BMI 24.51 kg/m  Physical Exam Constitutional:      General: She is not in acute distress.    Appearance: She  is well-developed.  Genitourinary:     Pelvic exam was performed with patient supine.     Vagina and uterus normal.     No vaginal erythema or bleeding.     No cervical motion tenderness, discharge, polyp or nabothian cyst.     Uterus is mobile.     Uterus is not enlarged.     No uterine mass detected.    Uterus is midaxial.     No right or left adnexal mass present.     Right adnexa not tender.     Left adnexa not tender.     Genitourinary Comments: Uterus and cervix small yet mobile with Gr 3 prolapse Also has Gr 2 cystocele and Gr 1 rectocele Normal vag mucosa No bleeding or lesions  HENT:     Head: Normocephalic and atraumatic.     Nose: Nose normal.  Abdominal:     General: There is no distension.     Palpations: Abdomen is soft.     Tenderness: There is no abdominal tenderness.  Musculoskeletal:        General: Normal range of motion.  Neurological:     Mental Status: She is alert and oriented to person, place, and time.     Cranial Nerves: No cranial nerve deficit.  Skin:    General: Skin is warm and dry.  Psychiatric:        Attention and Perception: Attention normal.        Mood and Affect: Mood and affect normal.        Speech: Speech normal.        Behavior: Behavior normal.  Thought Content: Thought content normal.        Judgment: Judgment normal.     Pessary Care Pessary removed and cleaned.  Vagina checked - without erosions - pessary replaced.  Pessary seems proper fit; could go up one size, but as she does not have expulsion and sx's are well controlled, I do not see the benefit in that change right now.  A/P:   ICD-10-CM   1. Uterine prolapse  N81.4   2. Cystocele, midline  N81.11   3. Rectocele  N81.6   Pessary was cleaned and replaced today. Instructions given for care. Concerning symptoms to observe for are counseled to patient. Follow up scheduled for 3 months.  Options for surgery (TVH BSO AP Repair) discussed as alternative to  pessary use (may decrease vag discharge and need for freq visits).  A total of 20 minutes were spent face-to-face with the patient as well as preparation, review, communication, and documentation during this encounter.   Barnett Applebaum, MD, [facility], [facility] 08/22/2019  9:38 AM

## 2019-09-23 DIAGNOSIS — Z85828 Personal history of other malignant neoplasm of skin: Secondary | ICD-10-CM | POA: Diagnosis not present

## 2019-09-23 DIAGNOSIS — D225 Melanocytic nevi of trunk: Secondary | ICD-10-CM | POA: Diagnosis not present

## 2019-09-23 DIAGNOSIS — L821 Other seborrheic keratosis: Secondary | ICD-10-CM | POA: Diagnosis not present

## 2019-09-23 DIAGNOSIS — L814 Other melanin hyperpigmentation: Secondary | ICD-10-CM | POA: Diagnosis not present

## 2019-09-23 DIAGNOSIS — X32XXXA Exposure to sunlight, initial encounter: Secondary | ICD-10-CM | POA: Diagnosis not present

## 2019-10-11 ENCOUNTER — Encounter: Payer: Self-pay | Admitting: Internal Medicine

## 2019-10-11 ENCOUNTER — Ambulatory Visit (INDEPENDENT_AMBULATORY_CARE_PROVIDER_SITE_OTHER): Payer: Medicare Other | Admitting: Internal Medicine

## 2019-10-11 ENCOUNTER — Other Ambulatory Visit: Payer: Self-pay

## 2019-10-11 VITALS — BP 140/88 | HR 80 | Temp 98.2°F | Resp 16 | Ht 62.0 in | Wt 134.6 lb

## 2019-10-11 DIAGNOSIS — E7800 Pure hypercholesterolemia, unspecified: Secondary | ICD-10-CM

## 2019-10-11 DIAGNOSIS — Z9109 Other allergy status, other than to drugs and biological substances: Secondary | ICD-10-CM

## 2019-10-11 DIAGNOSIS — R739 Hyperglycemia, unspecified: Secondary | ICD-10-CM | POA: Diagnosis not present

## 2019-10-11 DIAGNOSIS — F439 Reaction to severe stress, unspecified: Secondary | ICD-10-CM

## 2019-10-11 DIAGNOSIS — I10 Essential (primary) hypertension: Secondary | ICD-10-CM | POA: Diagnosis not present

## 2019-10-11 DIAGNOSIS — R928 Other abnormal and inconclusive findings on diagnostic imaging of breast: Secondary | ICD-10-CM | POA: Diagnosis not present

## 2019-10-11 DIAGNOSIS — Z0000 Encounter for general adult medical examination without abnormal findings: Secondary | ICD-10-CM

## 2019-10-11 MED ORDER — LISINOPRIL 5 MG PO TABS: 5.0000 mg | ORAL_TABLET | Freq: Every day | ORAL | 1 refills | Status: DC

## 2019-10-11 NOTE — Assessment & Plan Note (Addendum)
Physical today 10/11/19.  Colonoscopy 12/01/16.  Recommended f/u in 5 years.  Mammogram 06/2019.  Recommended aspiration.  Performed.  Radiology recommended f/u right breast ultrasound in 6 months.  Schedule.

## 2019-10-11 NOTE — Progress Notes (Signed)
[name], female   DOB: 1941/01/28, 79 y.o.   MRN: [id number]   Subjective:    [name], female    DOB: [birth year], 79 y.o.   MRN: [id number]  HPI This visit occurred during the SARS-CoV-2 public health emergency.  Safety protocols were in place, including screening questions prior to the visit, additional usage of staff PPE, and extensive cleaning of exam room while observing appropriate contact time as indicated for disinfecting solutions.  Patient with past history of hypertension, hyperglycemia and hypercholesterolemia.  She comes in today to follow up on these issues as well as for a complete physical exam.  She reports she is doing relatively well.  Stays active.  No chest pain or sob with increased activity or exertion.  No cough or congestion reported.  No abdominal pain or bowel change.  Increased stress.  Overall she feels she is handling things well.  Not taking her blood pressure medication regularly.  States her blood pressures is averaging 194R systolic.  Declines covid vaccine.    Past Medical History:  Diagnosis Date  . Allergy   . Carpal tunnel syndrome 1999  . GERD (gastroesophageal reflux disease)   . Hypercholesterolemia    By last lipid panel  . Hypertension    Borderline. She does not take the 12.5 HCTZ daily   Past Surgical History:  Procedure Laterality Date  . APPENDECTOMY  1963  . CARPAL TUNNEL RELEASE     right hand  . COLONOSCOPY    . COLONOSCOPY WITH PROPOFOL N/A 12/01/2016   Procedure: COLONOSCOPY WITH PROPOFOL;  Surgeon: Lucilla Lame, MD;  Location: [facility];  Service: Gastroenterology;  Laterality: N/A;  . EYE SURGERY     left  . POLYPECTOMY  12/01/2016   Procedure: POLYPECTOMY INTESTINAL;  Surgeon: Lucilla Lame, MD;  Location: [facility];  Service: Gastroenterology;;  Transverse colon polyp x 2 Ascending colon polyp x 3  . TONSILLECTOMY     Family History  Problem Relation Age of Onset  . Diabetes Mother     . Hypertension Brother   . Diabetes Maternal Grandmother   . Asthma Daughter   . Breast cancer Other   . Colon cancer Neg Hx    Social History   Socioeconomic History  . Marital status: Married    Spouse name: Not on file  . Number of children: 3  . Years of education: Not on file  . Highest education level: Not on file  Occupational History    Employer: OTHER  Tobacco Use  . Smoking status: Never Smoker  . Smokeless tobacco: Never Used  Vaping Use  . Vaping Use: Never used  Substance and Sexual Activity  . Alcohol use: Yes    Alcohol/week: 0.0 standard drinks    Comment: 2-3 drinks/month  . Drug use: No  . Sexual activity: Never  Other Topics Concern  . Not on file  Social History Narrative   Recently widowed. Her husband died of a heart attack in 05/15/2015   Social Determinants of Health   Financial Resource Strain:   . Difficulty of Paying Living Expenses:   Food Insecurity:   . Worried About Charity fundraiser in the Last Year:   . Arboriculturist in the Last Year:   Transportation Needs:   . Film/video editor (Medical):   Marland Kitchen Lack of Transportation (Non-Medical):   Physical Activity:   . Days of Exercise per Week:   .  Minutes of Exercise per Session:   Stress:   . Feeling of Stress :   Social Connections:   . Frequency of Communication with Friends and Family:   . Frequency of Social Gatherings with Friends and Family:   . Attends Religious Services:   . Active Member of Clubs or Organizations:   . Attends Archivist Meetings:   Marland Kitchen Marital Status:     Outpatient Encounter Medications as of 10/11/2019  Medication Sig  . ALOE VERA PO Take 1 tablet by mouth daily.   . Cholecalciferol (VITAMIN D3) 1000 UNITS CAPS Take 1,000 Units by mouth 2 (two) times daily.   . clonazePAM (KLONOPIN) 0.5 MG tablet Take 1 tablet (0.5 mg total) by mouth daily as needed for anxiety.  Marland Kitchen co-enzyme Q-10 30 MG capsule Take 30 mg by mouth 2 (two) times daily.  Marland Kitchen  conjugated estrogens (PREMARIN) vaginal cream Place 1 Applicatorful vaginally daily. Apply 0.'5mg'$  (pea-sized amount)  just inside the vaginal introitus with a finger-tip on  Monday, Wednesday and Friday nights.  . CYANOCOBALAMIN PO Take 1 tablet by mouth daily.   Marland Kitchen estradiol (ESTRACE VAGINAL) 0.1 MG/GM vaginal cream Apply 0.'5mg'$  (pea-sized amount)  just inside the vaginal introitus with a finger-tip on Monday, Wednesday and Friday nights.  . fluticasone (FLONASE) 50 MCG/ACT nasal spray Place 2 sprays into both nostrils daily.  Marland Kitchen lisinopril (ZESTRIL) 5 MG tablet Take 1 tablet (5 mg total) by mouth daily.  . magnesium 30 MG tablet Take 30 mg by mouth 1 day or 1 dose.  Marland Kitchen MAGNESIUM CITRATE PO Take 1 tablet by mouth daily.   . mometasone (ELOCON) 0.1 % lotion Apply topically daily. For no more than 7 days.  Marland Kitchen omega-3 acid ethyl esters (LOVAZA) 1 g capsule Take by mouth 3 (three) times daily.  . Omega-3 Fatty Acids (FISH OIL) 1000 MG CAPS Take 1,000 mg by mouth 2 (two) times daily.   . Red Yeast Rice Extract (RED YEAST RICE PO) Take 2 capsules by mouth daily.  Marland Kitchen sulfamethoxazole-trimethoprim (BACTRIM DS) 800-160 MG tablet Take 1 tablet by mouth 2 (two) times daily.  . vitamin C (ASCORBIC ACID) 500 MG tablet Take 500 mg by mouth 2 (two) times daily.  . [DISCONTINUED] lisinopril (PRINIVIL,ZESTRIL) 5 MG tablet Take 1 tablet (5 mg total) by mouth daily.   No facility-administered encounter medications on file as of 10/11/2019.    Review of Systems  Constitutional: Negative for appetite change and unexpected weight change.  HENT: Negative for congestion and sinus pressure.   Eyes: Negative for pain and visual disturbance.  Respiratory: Negative for cough, chest tightness and shortness of breath.   Cardiovascular: Negative for chest pain, palpitations and leg swelling.  Gastrointestinal: Negative for abdominal pain, constipation, diarrhea and vomiting.  Genitourinary: Negative for difficulty urinating and  dysuria.  Musculoskeletal: Negative for joint swelling and myalgias.  Skin: Negative for color change and rash.  Neurological: Negative for dizziness, light-headedness and headaches.  Hematological: Negative for adenopathy. Does not bruise/bleed easily.  Psychiatric/Behavioral: Negative for agitation and dysphoric mood.       Objective:    Physical Exam Vitals reviewed.  Constitutional:      General: She is not in acute distress.    Appearance: Normal appearance. She is well-developed.  HENT:     Head: Normocephalic and atraumatic.     Right Ear: External ear normal.     Left Ear: External ear normal.  Eyes:     General: No scleral icterus.  Right eye: No discharge.        Left eye: No discharge.     Conjunctiva/sclera: Conjunctivae normal.  Neck:     Thyroid: No thyromegaly.  Cardiovascular:     Rate and Rhythm: Normal rate and regular rhythm.  Pulmonary:     Effort: No tachypnea, accessory muscle usage or respiratory distress.     Breath sounds: Normal breath sounds. No decreased breath sounds or wheezing.  Chest:     Breasts:        Right: No inverted nipple, mass, nipple discharge or tenderness (no axillary adenopathy).        Left: No inverted nipple, mass, nipple discharge or tenderness (no axilarry adenopathy).  Abdominal:     General: Bowel sounds are normal.     Palpations: Abdomen is soft.     Tenderness: There is no abdominal tenderness.  Musculoskeletal:        General: No swelling or tenderness.     Cervical back: Neck supple. No tenderness.  Lymphadenopathy:     Cervical: No cervical adenopathy.  Skin:    Findings: No erythema or rash.  Neurological:     Mental Status: She is alert and oriented to person, place, and time.  Psychiatric:        Mood and Affect: Mood normal.        Behavior: Behavior normal.     BP 140/88   Pulse 80   Temp 98.2 F (36.8 C)   Resp 16   Ht 5' 2" (1.575 m)   Wt 134 lb 9.6 oz (61.1 kg)   LMP 04/28/1992   SpO2  98%   BMI 24.62 kg/m  Wt Readings from Last 3 Encounters:  10/11/19 134 lb 9.6 oz (61.1 kg)  08/22/19 134 lb (60.8 kg)  07/12/19 137 lb (62.1 kg)     Lab Results  Component Value Date   WBC 6.6 07/26/2018   HGB 14.0 07/26/2018   HCT 41.5 07/26/2018   PLT 239.0 07/26/2018   GLUCOSE 87 08/11/2019   CHOL 197 08/11/2019   TRIG 105.0 08/11/2019   HDL 58.40 08/11/2019   LDLCALC 118 (H) 08/11/2019   ALT 12 08/11/2019   AST 18 08/11/2019   NA 138 08/11/2019   K 3.9 08/11/2019   CL 106 08/11/2019   CREATININE 0.82 08/11/2019   BUN 18 08/11/2019   CO2 26 08/11/2019   TSH 1.68 07/26/2018   HGBA1C 5.7 08/11/2019    US BREAST ASPIRATION RIGHT  Result Date: 07/13/2019 CLINICAL DATA:  Ultrasound-guided aspiration of a probable right breast cyst recommended. EXAM: ULTRASOUND GUIDED RIGHT BREAST CYST ASPIRATION COMPARISON:  Previous exams. PROCEDURE: Using sterile technique, 1% lidocaine, under direct ultrasound visualization, needle aspiration of a cyst 11 o'clock retroareolar region of the right breast was performed. A trace amount of dark green fluid was aspirated and the cyst decompressed. IMPRESSION: Ultrasound-guided aspiration of the right breast no apparent complications. RECOMMENDATIONS: Short-term interval follow-up right breast ultrasound will be performed in 6 months. Electronically Signed   By: Lillia Mountain M.D.   On: 07/13/2019 13:30       Assessment & Plan:   Problem List Items Addressed This Visit    Abnormal mammogram - Primary    Abnormal mammogram 06/2019.  F/u aspiration.  Recommended f/u breast ultrasound in 6 months.        Relevant Orders   US BREAST LTD UNI RIGHT INC AXILLA   Environmental allergies    Controlled.  Health care maintenance    Physical today 10/11/19.  Colonoscopy 12/01/16.  Recommended f/u in 5 years.  Mammogram 06/2019.  Recommended aspiration.  Performed.  Radiology recommended f/u right breast ultrasound in 6 months.  Schedule.         Hypercholesterolemia    Has tried crestor.  Desires not to restart.  Low cholesterol diet and exercise.  Follow lipid panel.       Relevant Medications   lisinopril (ZESTRIL) 5 MG tablet   Other Relevant Orders   Hepatic function panel   Lipid panel   Hyperglycemia    Low carb diet and exercise.  Follow met b and a1c.        Relevant Orders   Hemoglobin A1c   Hypertension    Blood pressure elevated.  Not taking lisinopril regularly.  Skipping doses.  Instructed to take regularly.  Desires not to increase dose.  Follow pressures.  Follow metabolic panel.        Relevant Medications   lisinopril (ZESTRIL) 5 MG tablet   Other Relevant Orders   CBC with Differential/Platelet   TSH   Basic metabolic panel   Stress    Handling stress.  Does not feel needs any further intervention at this time.  Follow.           [name], MD

## 2019-10-12 ENCOUNTER — Ambulatory Visit: Payer: Medicare Other | Admitting: Podiatry

## 2019-10-17 ENCOUNTER — Encounter: Payer: Self-pay | Admitting: Internal Medicine

## 2019-10-17 NOTE — Assessment & Plan Note (Signed)
Low carb diet and exercise.  Follow met b and a1c.   

## 2019-10-17 NOTE — Assessment & Plan Note (Signed)
Handling stress.  Does not feel needs any further intervention at this time.  Follow.   

## 2019-10-17 NOTE — Assessment & Plan Note (Signed)
Controlled.  

## 2019-10-17 NOTE — Assessment & Plan Note (Signed)
Abnormal mammogram 06/2019.  F/u aspiration.  Recommended f/u breast ultrasound in 6 months.

## 2019-10-17 NOTE — Assessment & Plan Note (Signed)
Blood pressure elevated.  Not taking lisinopril regularly.  Skipping doses.  Instructed to take regularly.  Desires not to increase dose.  Follow pressures.  Follow metabolic panel.

## 2019-10-17 NOTE — Assessment & Plan Note (Signed)
Has tried crestor.  Desires not to restart.  Low cholesterol diet and exercise.  Follow lipid panel.

## 2019-11-28 ENCOUNTER — Other Ambulatory Visit: Payer: Self-pay

## 2019-11-28 ENCOUNTER — Ambulatory Visit (INDEPENDENT_AMBULATORY_CARE_PROVIDER_SITE_OTHER): Payer: Medicare Other | Admitting: Obstetrics & Gynecology

## 2019-11-28 ENCOUNTER — Ambulatory Visit: Payer: Medicare Other | Admitting: Obstetrics & Gynecology

## 2019-11-28 ENCOUNTER — Encounter: Payer: Self-pay | Admitting: Obstetrics & Gynecology

## 2019-11-28 VITALS — BP 140/80 | Ht 62.0 in | Wt 135.0 lb

## 2019-11-28 DIAGNOSIS — N8111 Cystocele, midline: Secondary | ICD-10-CM

## 2019-11-28 DIAGNOSIS — N814 Uterovaginal prolapse, unspecified: Secondary | ICD-10-CM | POA: Diagnosis not present

## 2019-11-28 DIAGNOSIS — N816 Rectocele: Secondary | ICD-10-CM

## 2019-11-28 NOTE — Progress Notes (Signed)
HPI:      [name] is a 79 y.o. [phone] who presents today for her pessary follow up and examination related to her pelvic floor weakening.  Pt reports tolerating the pessary well with no vaginal bleeding and no vaginal discharge.  Symptoms of pelvic floor weakening have greatly improved. She is voiding and defecating without difficulty. She currently has a RING #3 pessary.  Occas d/c esp towards end of 3 mos from last visit.  Urinary freq mild.  PMHx: She  has a past medical history of Allergy, Carpal tunnel syndrome (1999), GERD (gastroesophageal reflux disease), Hypercholesterolemia, and Hypertension. Also,  has a past surgical history that includes Appendectomy (1963); Tonsillectomy; Eye surgery; Carpal tunnel release; Colonoscopy; Colonoscopy with propofol (N/A, 12/01/2016); and Polypectomy (12/01/2016)., family history includes Asthma in her daughter; Breast cancer in an other family member; Diabetes in her maternal grandmother and mother; Hypertension in her brother.,  reports that she has never smoked. She has never used smokeless tobacco. She reports current alcohol use. She reports that she does not use drugs.  She has a current medication list which includes the following prescription(s): sulfamethoxazole-trimethoprim, vitamin c, aloe vera, vitamin d3, clonazepam, co-enzyme q-10, conjugated estrogens, cyanocobalamin, estradiol, fluticasone, lisinopril, magnesium, magnesium citrate, mometasone, omega-3 acid ethyl esters, fish oil, and red yeast rice extract. Also, is allergic to codeine sulfate.  Review of Systems  All other systems reviewed and are negative.   Objective: BP 140/80    Ht 5\' 2"  (1.575 m)    Wt 135 lb (61.2 kg)    LMP 04/28/1992    BMI 24.69 kg/m  Physical Exam Constitutional:      General: She is not in acute distress.    Appearance: She is well-developed.  Genitourinary:     Pelvic exam was performed with patient supine.     Vagina and uterus normal.     No  vaginal erythema or bleeding.     No cervical motion tenderness, discharge, polyp or nabothian cyst.     Uterus is mobile.     Uterus is not enlarged.     No uterine mass detected.    Uterus is midaxial.     No right or left adnexal mass present.     Right adnexa not tender.     Left adnexa not tender.     Genitourinary Comments: Uterus and cervix small yet mobile with Gr 3 prolapse Also has Gr 2 cystocele and Gr 1 rectocele Normal vag mucosa No bleeding or lesions   HENT:     Head: Normocephalic and atraumatic.     Nose: Nose normal.  Abdominal:     General: There is no distension.     Palpations: Abdomen is soft.     Tenderness: There is no abdominal tenderness.  Musculoskeletal:        General: Normal range of motion.  Neurological:     Mental Status: She is alert and oriented to person, place, and time.     Cranial Nerves: No cranial nerve deficit.  Skin:    General: Skin is warm and dry.  Psychiatric:        Attention and Perception: Attention normal.        Mood and Affect: Mood and affect normal.        Speech: Speech normal.        Behavior: Behavior normal.        Thought Content: Thought content normal.        Judgment:  Judgment normal.     Pessary Care Pessary removed and cleaned.  Vagina checked - without erosions - pessary replaced.  A/P:   ICD-10-CM   1. Uterine prolapse  N81.4   2. Cystocele, midline  N81.11   3. Rectocele  N81.6    Pessary was cleaned and replaced today. Instructions given for care. Concerning symptoms to observe for are counseled to patient. Follow up scheduled for 3 months. Surgery options discussed, not desired at this time.  A total of 20 minutes were spent face-to-face with the patient as well as preparation, review, communication, and documentation during this encounter.   Barnett Applebaum, MD, [facility], [facility] 11/28/2019  10:19 AM

## 2019-12-05 ENCOUNTER — Ambulatory Visit: Payer: Medicare Other | Admitting: Obstetrics & Gynecology

## 2019-12-06 ENCOUNTER — Other Ambulatory Visit: Payer: Self-pay | Admitting: Internal Medicine

## 2019-12-06 DIAGNOSIS — G4700 Insomnia, unspecified: Secondary | ICD-10-CM

## 2019-12-06 DIAGNOSIS — F419 Anxiety disorder, unspecified: Secondary | ICD-10-CM

## 2019-12-07 NOTE — Telephone Encounter (Signed)
rx ok'd for clonazepam #30 with no refills.  (PDMP reviewed. )

## 2019-12-23 ENCOUNTER — Other Ambulatory Visit: Payer: Federal, State, Local not specified - PPO

## 2019-12-29 ENCOUNTER — Ambulatory Visit: Payer: Federal, State, Local not specified - PPO | Admitting: Internal Medicine

## 2020-01-06 ENCOUNTER — Other Ambulatory Visit (INDEPENDENT_AMBULATORY_CARE_PROVIDER_SITE_OTHER): Payer: Medicare Other

## 2020-01-06 ENCOUNTER — Other Ambulatory Visit: Payer: Self-pay

## 2020-01-06 DIAGNOSIS — R739 Hyperglycemia, unspecified: Secondary | ICD-10-CM | POA: Diagnosis not present

## 2020-01-06 DIAGNOSIS — I10 Essential (primary) hypertension: Secondary | ICD-10-CM | POA: Diagnosis not present

## 2020-01-06 DIAGNOSIS — E7800 Pure hypercholesterolemia, unspecified: Secondary | ICD-10-CM

## 2020-01-06 LAB — CBC WITH DIFFERENTIAL/PLATELET
Basophils Absolute: 0 10*3/uL (ref 0.0–0.1)
Basophils Relative: 0.8 % (ref 0.0–3.0)
Eosinophils Absolute: 0.1 10*3/uL (ref 0.0–0.7)
Eosinophils Relative: 2.4 % (ref 0.0–5.0)
HCT: 41.5 % (ref 36.0–46.0)
Hemoglobin: 13.7 g/dL (ref 12.0–15.0)
Lymphocytes Relative: 29.3 % (ref 12.0–46.0)
Lymphs Abs: 1.8 10*3/uL (ref 0.7–4.0)
MCHC: 33.2 g/dL (ref 30.0–36.0)
MCV: 89.4 fl (ref 78.0–100.0)
Monocytes Absolute: 0.8 10*3/uL (ref 0.1–1.0)
Monocytes Relative: 12 % (ref 3.0–12.0)
Neutro Abs: 3.5 10*3/uL (ref 1.4–7.7)
Neutrophils Relative %: 55.5 % (ref 43.0–77.0)
Platelets: 235 10*3/uL (ref 150.0–400.0)
RBC: 4.64 Mil/uL (ref 3.87–5.11)
RDW: 13 % (ref 11.5–15.5)
WBC: 6.3 10*3/uL (ref 4.0–10.5)

## 2020-01-06 LAB — LIPID PANEL
Cholesterol: 198 mg/dL (ref 0–200)
HDL: 54.7 mg/dL (ref >39.00)
LDL Cholesterol: 119 mg/dL — ABNORMAL HIGH (ref 0–99)
NonHDL: 143.25
Total CHOL/HDL Ratio: 4
Triglycerides: 120 mg/dL (ref 0.0–149.0)
VLDL: 24 mg/dL (ref 0.0–40.0)

## 2020-01-06 LAB — BASIC METABOLIC PANEL
BUN: 14 mg/dL (ref 6–23)
CO2: 27 mEq/L (ref 19–32)
Calcium: 9.7 mg/dL (ref 8.4–10.5)
Chloride: 105 mEq/L (ref 96–112)
Creatinine, Ser: 0.84 mg/dL (ref 0.40–1.20)
GFR: 65.76 mL/min (ref >60.00)
Glucose, Bld: 89 mg/dL (ref 70–99)
Potassium: 4 mEq/L (ref 3.5–5.1)
Sodium: 139 mEq/L (ref 135–145)

## 2020-01-06 LAB — HEPATIC FUNCTION PANEL
ALT: 13 U/L (ref 0–35)
AST: 19 U/L (ref 0–37)
Albumin: 4.2 g/dL (ref 3.5–5.2)
Alkaline Phosphatase: 86 U/L (ref 39–117)
Bilirubin, Direct: 0.1 mg/dL (ref 0.0–0.3)
Total Bilirubin: 0.7 mg/dL (ref 0.2–1.2)
Total Protein: 6.7 g/dL (ref 6.0–8.3)

## 2020-01-06 LAB — TSH: TSH: 1.4 u[IU]/mL (ref 0.35–4.50)

## 2020-01-06 LAB — HEMOGLOBIN A1C: Hgb A1c MFr Bld: 5.9 % (ref 4.6–6.5)

## 2020-01-10 ENCOUNTER — Telehealth: Payer: Federal, State, Local not specified - PPO | Admitting: Internal Medicine

## 2020-02-14 ENCOUNTER — Telehealth (INDEPENDENT_AMBULATORY_CARE_PROVIDER_SITE_OTHER): Payer: Medicare Other | Admitting: Internal Medicine

## 2020-02-14 ENCOUNTER — Other Ambulatory Visit: Payer: Self-pay

## 2020-02-14 DIAGNOSIS — R739 Hyperglycemia, unspecified: Secondary | ICD-10-CM | POA: Diagnosis not present

## 2020-02-14 DIAGNOSIS — F419 Anxiety disorder, unspecified: Secondary | ICD-10-CM | POA: Diagnosis not present

## 2020-02-14 DIAGNOSIS — E7800 Pure hypercholesterolemia, unspecified: Secondary | ICD-10-CM

## 2020-02-14 DIAGNOSIS — I10 Essential (primary) hypertension: Secondary | ICD-10-CM | POA: Diagnosis not present

## 2020-02-14 DIAGNOSIS — Z9109 Other allergy status, other than to drugs and biological substances: Secondary | ICD-10-CM | POA: Diagnosis not present

## 2020-02-14 DIAGNOSIS — R928 Other abnormal and inconclusive findings on diagnostic imaging of breast: Secondary | ICD-10-CM | POA: Diagnosis not present

## 2020-02-14 DIAGNOSIS — H34219 Partial retinal artery occlusion, unspecified eye: Secondary | ICD-10-CM

## 2020-02-14 MED ORDER — ROSUVASTATIN CALCIUM 5 MG PO TABS: ORAL_TABLET | ORAL | 1 refills | Status: DC

## 2020-02-14 NOTE — Progress Notes (Signed)
[name], female   DOB: 1940-10-18, 79 y.o.   MRN: [id number]   Virtual Visit via video Note  This visit type was conducted due to national recommendations for restrictions regarding the COVID-19 pandemic (e.g. social distancing).  This format is felt to be most appropriate for this patient at this time.  All issues noted in this document were discussed and addressed.  No physical exam was performed (except for noted visual exam findings with Video Visits).   I connected with Donna Lara by a video enabled telemedicine application and verified that I am speaking with the correct person using two identifiers. Location patient: home Location provider: work Persons participating in the virtual visit: patient, provider  The limitations, risks, security and privacy concerns of performing an evaluation and management service by video and the availability of in person appointments have been discussed. It has also been discussed with the patient that there may be a patient responsible charge related to this service. The patient expressed understanding and agreed to proceed.   Reason for visit: scheduled follow up.    HPI: Here to follow up regarding her blood pressure and cholesterol.  She reports she is doing relatively well.  Allergies controlled.  No chest pain or sob reported.  No abdominal pain or bowel change reported.  No nausea or vomiting.  Discussed labs.  Discussed calculated cholesterol risk.  Discussed starting low dose cholesterol medication.  Due f/u breast ultrasound.    ROS: See pertinent positives and negatives per HPI.  Past Medical History:  Diagnosis Date  . Allergy   . Carpal tunnel syndrome 1999  . GERD (gastroesophageal reflux disease)   . Hypercholesterolemia    By last lipid panel  . Hypertension    Borderline. She does not take the 12.5 HCTZ daily    Past Surgical History:  Procedure Laterality Date  . APPENDECTOMY  1963  . CARPAL TUNNEL RELEASE      right hand  . COLONOSCOPY    . COLONOSCOPY WITH PROPOFOL N/A 12/01/2016   Procedure: COLONOSCOPY WITH PROPOFOL;  Surgeon: Lucilla Lame, MD;  Location: [place];  Service: Gastroenterology;  Laterality: N/A;  . EYE SURGERY     left  . POLYPECTOMY  12/01/2016   Procedure: POLYPECTOMY INTESTINAL;  Surgeon: Lucilla Lame, MD;  Location: [facility];  Service: Gastroenterology;;  Transverse colon polyp x 2 Ascending colon polyp x 3  . TONSILLECTOMY      Family History  Problem Relation Age of Onset  . Diabetes Mother   . Hypertension Brother   . Diabetes Maternal Grandmother   . Asthma Daughter   . Breast cancer Other   . Colon cancer Neg Hx     SOCIAL HX: reviewed.    Current Outpatient Medications:  .  Cholecalciferol (VITAMIN D3) 1000 UNITS CAPS, Take 1,000 Units by mouth 2 (two) times daily. , Disp: , Rfl:  .  clonazePAM (KLONOPIN) 0.5 MG tablet, Take 1 tablet (0.5 mg total) by mouth daily as needed for anxiety., Disp: 30 tablet, Rfl: 0 .  co-enzyme Q-10 30 MG capsule, Take 30 mg by mouth 2 (two) times daily. , Disp: , Rfl:  .  CYANOCOBALAMIN PO, Take 1 tablet by mouth daily. , Disp: , Rfl:  .  fluticasone (FLONASE) 50 MCG/ACT nasal spray, Place 2 sprays into both nostrils daily., Disp: 16 g, Rfl: 6 .  lisinopril (ZESTRIL) 5 MG tablet, Take 1 tablet (5 mg total) by mouth daily., Disp: 90 tablet,  Rfl: 1 .  magnesium 30 MG tablet, Take 30 mg by mouth 1 day or 1 dose. , Disp: , Rfl:  .  MAGNESIUM CITRATE PO, Take 1 tablet by mouth daily. , Disp: , Rfl:  .  mometasone (ELOCON) 0.1 % lotion, Apply topically daily. For no more than 7 days., Disp: 30 mL, Rfl: 0 .  Omega-3 Fatty Acids (FISH OIL) 1000 MG CAPS, Take 1,000 mg by mouth 2 (two) times daily. , Disp: , Rfl:  .  Red Yeast Rice Extract (RED YEAST RICE PO), Take 2 capsules by mouth daily. , Disp: , Rfl:  .  vitamin C (ASCORBIC ACID) 500 MG tablet, Take 500 mg by mouth 2 (two) times daily., Disp: , Rfl:  .   rosuvastatin (CRESTOR) 5 MG tablet, Take one tablet 2x/week., Disp: 30 tablet, Rfl: 1  EXAM:  GENERAL: alert, oriented, appears well and in no acute distress  HEENT: atraumatic, conjunttiva clear, no obvious abnormalities on inspection of external nose and ears  NECK: normal movements of the head and neck  LUNGS: on inspection no signs of respiratory distress, breathing rate appears normal, no obvious gross SOB, gasping or wheezing  CV: no obvious cyanosis  PSYCH/NEURO: pleasant and cooperative, no obvious depression or anxiety, speech and thought processing grossly intact  ASSESSMENT AND PLAN:  Discussed the following assessment and plan:  Problem List Items Addressed This Visit    Hypertension    Blood pressure has been elevated previously.  On lisinopril 75m q day.  Have asked her to spot check her pressure.  Follow pressures.  Follow metabolic panel.       Relevant Medications   rosuvastatin (CRESTOR) 5 MG tablet   Hyperglycemia    Low carb diet and exercise.  Follow met b and a1c.       Hypercholesterolemia    The 10-year ASCVD risk score (Mikey BussingDC JBrooke Bonito, et al., 2013) is: 29.9%   Values used to calculate the score:     Age: [age]years     Sex: Female     Is Non-Hispanic African American: No     Diabetic: No     Tobacco smoker: No     Systolic Blood Pressure: 1143mmHg     Is BP treated: Yes     HDL Cholesterol: 54.7 mg/dL     Total Cholesterol: 198 mg/dL  Discussed calculated cholesterol risk.  Discussed the need to start statin medication.  Start crestor 571m2x/week.  Follow lipid panel and liver function tests.        Relevant Medications   rosuvastatin (CRESTOR) 5 MG tablet   Other Relevant Orders   Hepatic function panel   Hollenhorst plaque    Agreeable to start crestor.  Follow.       Relevant Medications   rosuvastatin (CRESTOR) 5 MG tablet   Environmental allergies    Controlled.        Anxiety    Overall handling stress well.  Follow.        Abnormal mammogram    Abnormal mammogram 06/2019.  S/p aspiration - cyst.  Recommended f/u right breast ultrasound in 6 months.  Overdue f/u.  Needs to schedule.            I discussed the assessment and treatment plan with the patient. The patient was provided an opportunity to ask questions and all were answered. The patient agreed with the plan and demonstrated an understanding of the instructions.   The patient was advised  to call back or seek an in-person evaluation if the symptoms worsen or if the condition fails to improve as anticipated.    [name], MD

## 2020-02-14 NOTE — Assessment & Plan Note (Addendum)
The 10-year ASCVD risk score Mikey Bussing DC Brooke Bonito., et al., 2013) is: 29.9%   Values used to calculate the score:     Age: 79 years     Sex: Female     Is Non-Hispanic African American: No     Diabetic: No     Tobacco smoker: No     Systolic Blood Pressure: 573 mmHg     Is BP treated: Yes     HDL Cholesterol: 54.7 mg/dL     Total Cholesterol: 198 mg/dL  Discussed calculated cholesterol risk.  Discussed the need to start statin medication.  Start crestor 5mg  2x/week.  Follow lipid panel and liver function tests.

## 2020-02-19 ENCOUNTER — Encounter: Payer: Self-pay | Admitting: Internal Medicine

## 2020-02-19 ENCOUNTER — Telehealth: Payer: Self-pay | Admitting: Internal Medicine

## 2020-02-19 NOTE — Assessment & Plan Note (Signed)
Low carb diet and exercise.  Follow met b and a1c.  

## 2020-02-19 NOTE — Telephone Encounter (Signed)
Needs a f/u ultrasound (right breast) - scheduled.  S/p aspiration and recommended 6 month f/u.  Over due f/u right breast ultrasound.  Need to schedule.  Thanks

## 2020-02-19 NOTE — Assessment & Plan Note (Addendum)
Abnormal mammogram 06/2019.  S/p aspiration - cyst.  Recommended f/u right breast ultrasound in 6 months.  Overdue f/u.  Needs to schedule.

## 2020-02-19 NOTE — Assessment & Plan Note (Addendum)
Blood pressure has been elevated previously.  On lisinopril 5mg  q day.  Have asked her to spot check her pressure.  Follow pressures.  Follow metabolic panel.

## 2020-02-19 NOTE — Assessment & Plan Note (Signed)
Overall handling stress well.  Follow.

## 2020-02-19 NOTE — Assessment & Plan Note (Signed)
Controlled.  

## 2020-02-19 NOTE — Assessment & Plan Note (Signed)
Agreeable to start crestor.  Follow.

## 2020-02-24 NOTE — Telephone Encounter (Signed)
LM for [place] 

## 2020-02-29 NOTE — Telephone Encounter (Signed)
GAVE NUMBER TO PT. SHE IS GOING TO CALL AND SCHEDULE

## 2020-03-03 IMAGING — MG MM DIGITAL SCREENING BILAT W/ TOMO W/ CAD
9 of 13 series · 9 of 29 positions shown · non-contrast
Comparison: Previous exam(s).

CLINICAL DATA: Screening.

EXAM:
DIGITAL SCREENING BILATERAL MAMMOGRAM WITH TOMO AND CAD

[L CV]
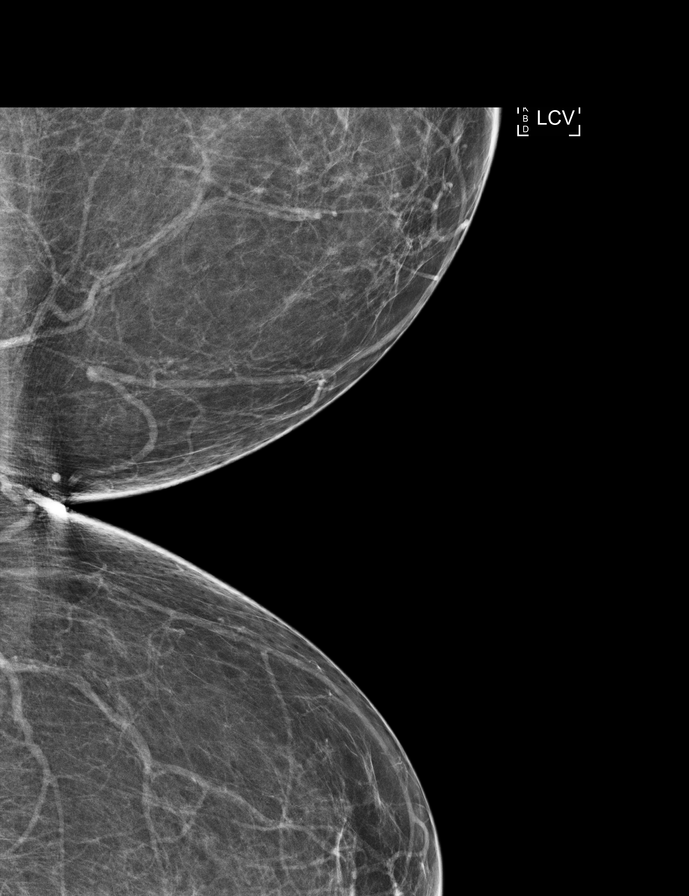

[R MLO]
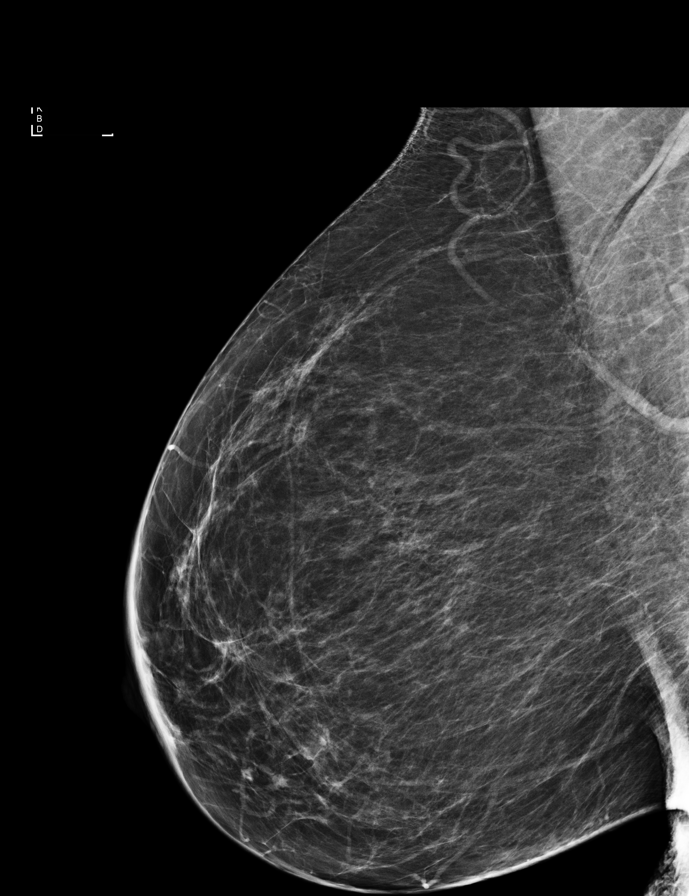

[L MLO synth-2D]
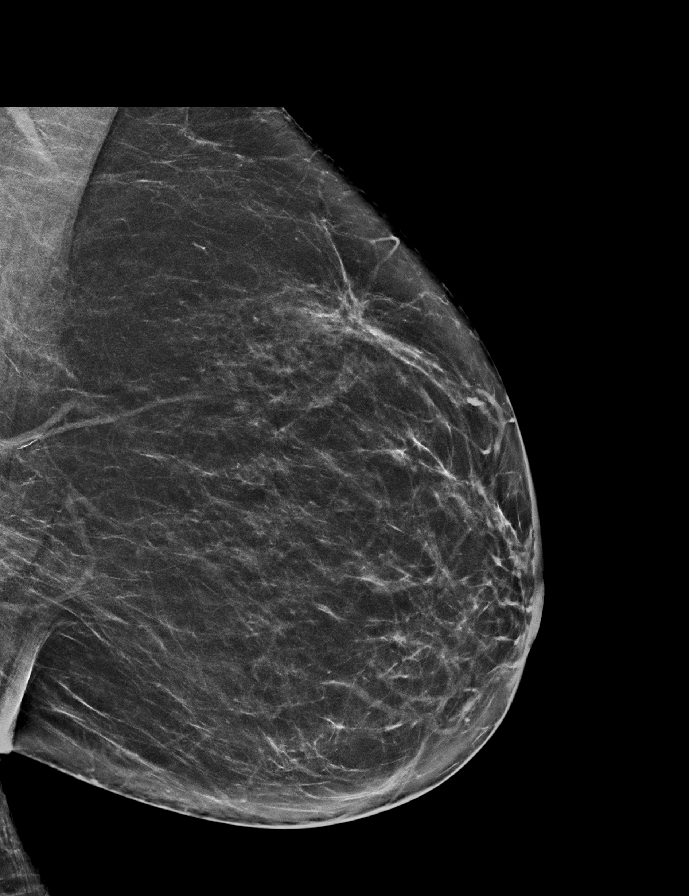

[L CC synth-2D]
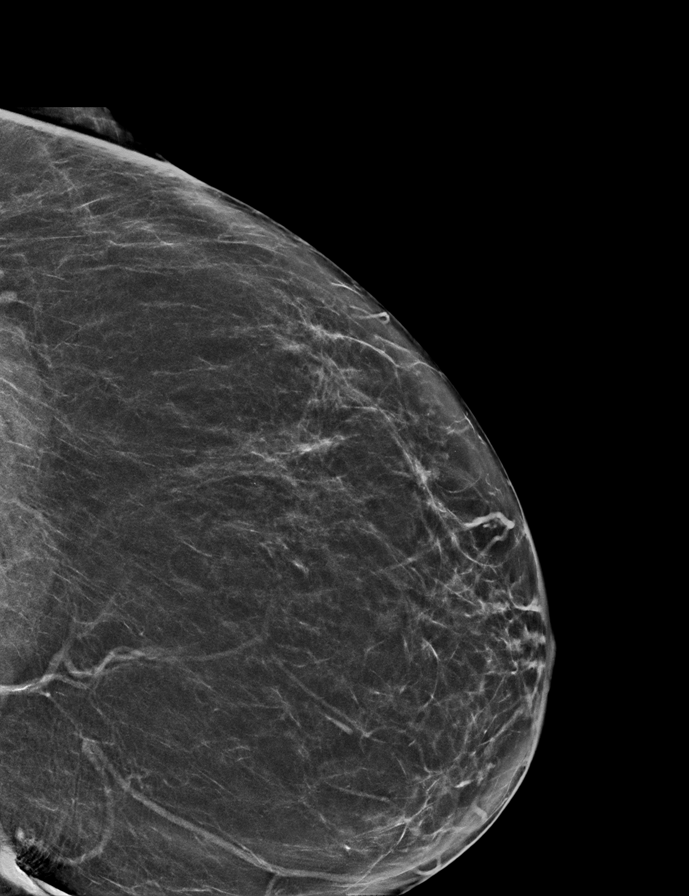

[R MLO synth-2D]
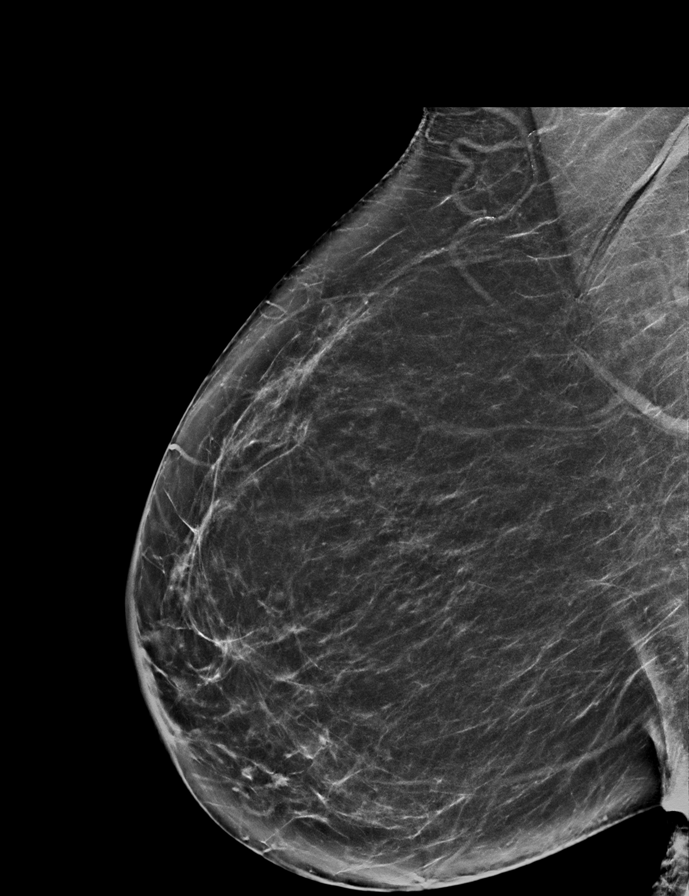

[L MLO]
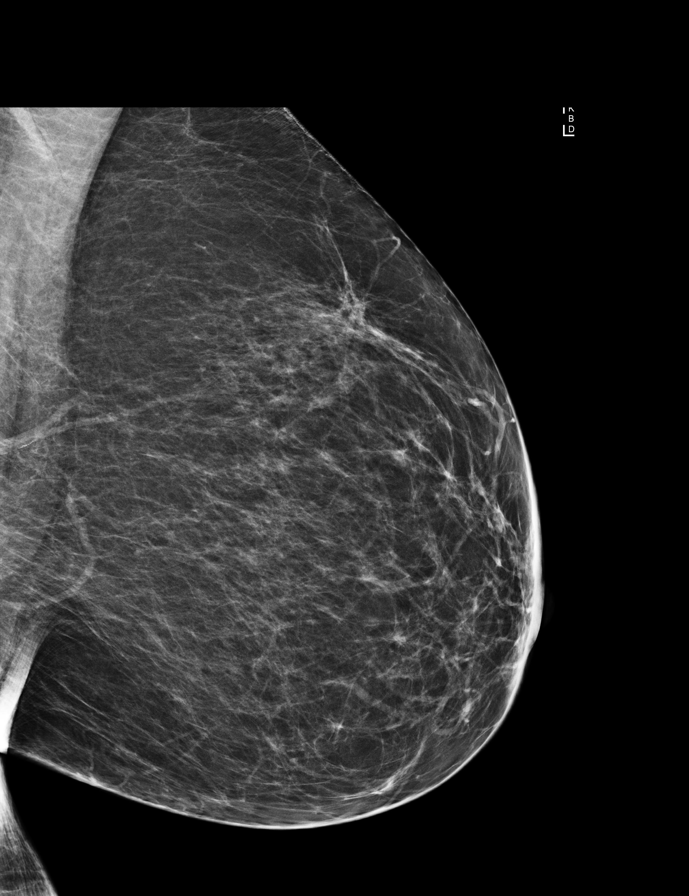

[R CC synth-2D]
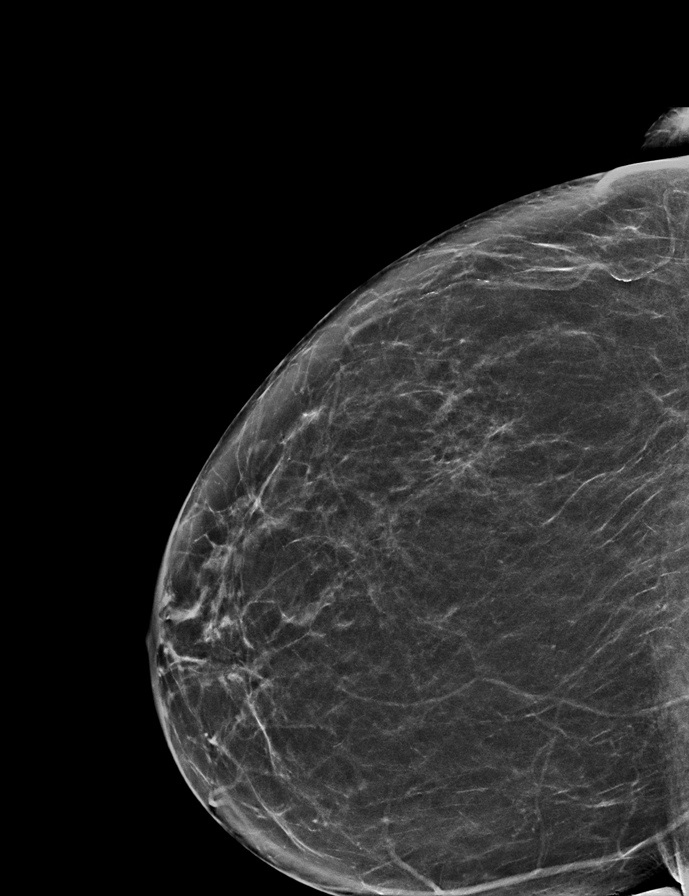

[R CC]
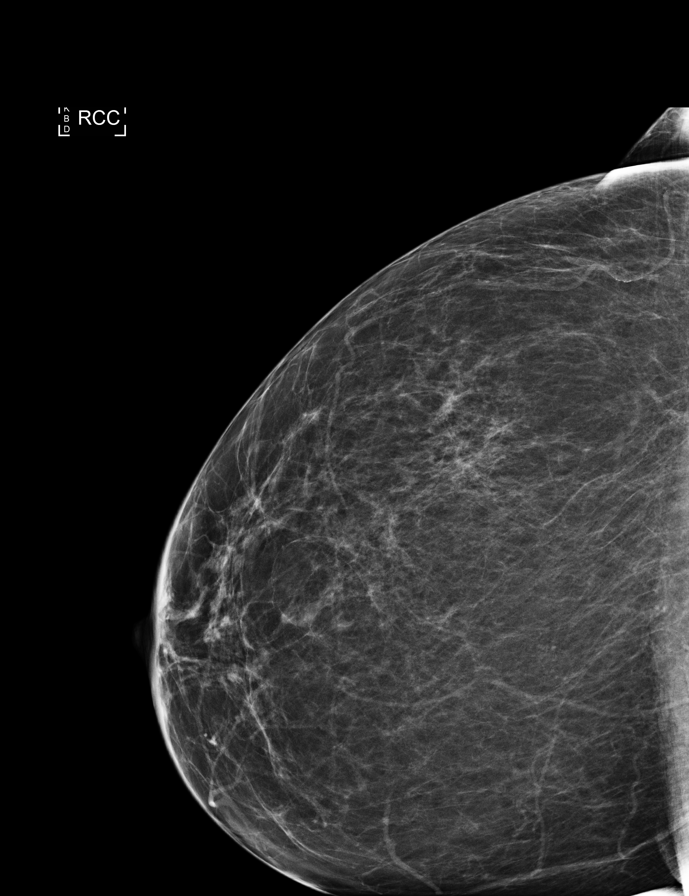

[L CC]
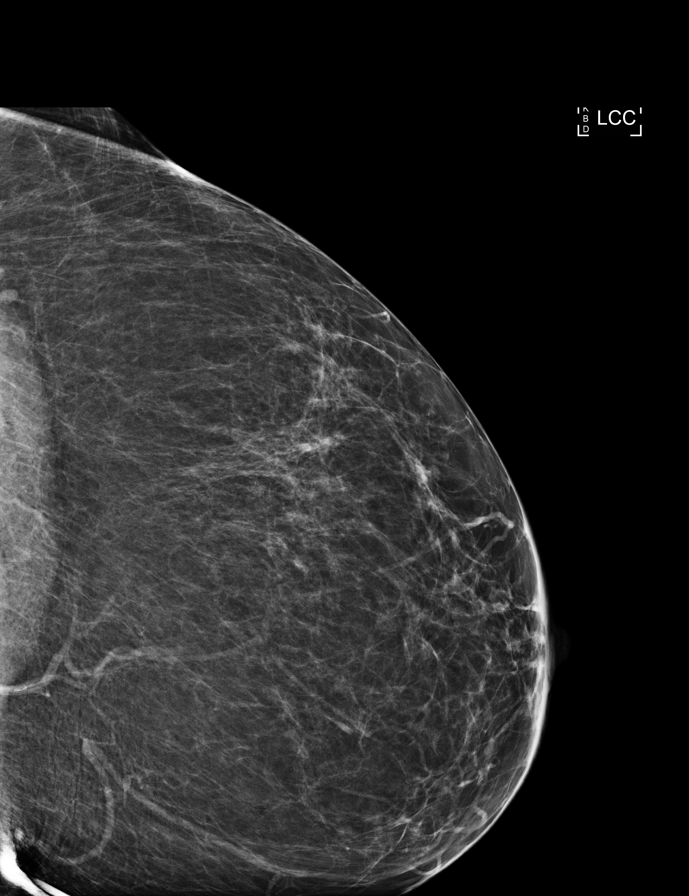

[9 of 29 positions shown; findings below may reference images not displayed]

ACR Breast Density Category b: There are scattered areas of
fibroglandular density.
FINDINGS: There are no findings suspicious for malignancy. Images were
processed with CAD.
IMPRESSION: No mammographic evidence of malignancy. A result letter of this
screening mammogram will be mailed directly to the patient.

RECOMMENDATION:
Screening mammogram in one year. (Code:[id number])

BI-RADS CATEGORY  1: Negative.

## 2020-03-05 ENCOUNTER — Other Ambulatory Visit: Payer: Self-pay

## 2020-03-05 ENCOUNTER — Encounter: Payer: Self-pay | Admitting: Obstetrics & Gynecology

## 2020-03-05 ENCOUNTER — Ambulatory Visit (INDEPENDENT_AMBULATORY_CARE_PROVIDER_SITE_OTHER): Payer: Medicare Other | Admitting: Obstetrics & Gynecology

## 2020-03-05 VITALS — BP 140/90 | Ht 62.0 in | Wt 137.0 lb

## 2020-03-05 DIAGNOSIS — N8111 Cystocele, midline: Secondary | ICD-10-CM

## 2020-03-05 DIAGNOSIS — N816 Rectocele: Secondary | ICD-10-CM | POA: Diagnosis not present

## 2020-03-05 DIAGNOSIS — N814 Uterovaginal prolapse, unspecified: Secondary | ICD-10-CM | POA: Diagnosis not present

## 2020-03-05 NOTE — Progress Notes (Signed)
HPI:      [name] is a 79 y.o. [phone] who presents today for her pessary follow up and examination related to her pelvic floor weakening.  Pt reports tolerating the pessary well with no vaginal bleeding and no vaginal discharge.  Symptoms of pelvic floor weakening have greatly improved. She is voiding and defecating without difficulty. She currently has a Ring #3 pessary.  PMHx: She  has a past medical history of Allergy, Carpal tunnel syndrome (1999), GERD (gastroesophageal reflux disease), Hypercholesterolemia, and Hypertension. Also,  has a past surgical history that includes Appendectomy (1963); Tonsillectomy; Eye surgery; Carpal tunnel release; Colonoscopy; Colonoscopy with propofol (N/A, 12/01/2016); and Polypectomy (12/01/2016)., family history includes Asthma in her daughter; Breast cancer in an other family member; Diabetes in her maternal grandmother and mother; Hypertension in her brother.,  reports that she has never smoked. She has never used smokeless tobacco. She reports current alcohol use. She reports that she does not use drugs.  She has a current medication list which includes the following prescription(s): vitamin d3, clonazepam, co-enzyme q-10, cyanocobalamin, fluticasone, lisinopril, magnesium, magnesium citrate, mometasone, fish oil, red yeast rice extract, rosuvastatin, and vitamin c. Also, is allergic to codeine sulfate.  Review of Systems  All other systems reviewed and are negative.   Objective: BP 140/90   Ht 5\' 2"  (1.575 m)   Wt 137 lb (62.1 kg)   LMP 04/28/1992   BMI 25.06 kg/m  Physical Exam Constitutional:      General: She is not in acute distress.    Appearance: She is well-developed.  Genitourinary:     Pelvic exam was performed with patient supine.     Vagina and uterus normal.     No vaginal erythema or bleeding.     No cervical motion tenderness, discharge, polyp or nabothian cyst.     Uterus is mobile.     Uterus is not enlarged.     No  uterine mass detected.    Uterus is midaxial.     No right or left adnexal mass present.     Right adnexa not tender.     Left adnexa not tender.     Genitourinary Comments: Uterus and cervix small yet mobile with Gr 3 prolapse Also has Gr 2 cystocele and Gr 1 rectocele Normal vag mucosa No bleeding or lesions    HENT:     Head: Normocephalic and atraumatic.     Nose: Nose normal.  Abdominal:     General: There is no distension.     Palpations: Abdomen is soft.     Tenderness: There is no abdominal tenderness.  Musculoskeletal:        General: Normal range of motion.  Neurological:     Mental Status: She is alert and oriented to person, place, and time.     Cranial Nerves: No cranial nerve deficit.  Skin:    General: Skin is warm and dry.  Psychiatric:        Attention and Perception: Attention normal.        Mood and Affect: Mood and affect normal.        Speech: Speech normal.        Behavior: Behavior normal.        Thought Content: Thought content normal.        Judgment: Judgment normal.     Pessary Care Pessary removed and cleaned.  Vagina checked - without erosions - pessary replaced.  A/P:   ICD-10-CM  1. Uterine prolapse  N81.4   2. Cystocele, midline  N81.11   3. Rectocele  N81.6    Pessary was cleaned and replaced today. Instructions given for care. Concerning symptoms to observe for are counseled to patient. Follow up scheduled for 3 months.  A total of 20 minutes were spent face-to-face with the patient as well as preparation, review, communication, and documentation during this encounter.   Barnett Applebaum, MD, [facility], [facility] 03/05/2020  10:22 AM

## 2020-03-20 ENCOUNTER — Other Ambulatory Visit: Payer: Federal, State, Local not specified - PPO

## 2020-03-23 ENCOUNTER — Ambulatory Visit
Admission: RE | Admit: 2020-03-23 | Discharge: 2020-03-23 | Disposition: A | Discharge status: Home / Self Care | Payer: Medicare Other | Source: Ambulatory Visit | Attending: Internal Medicine | Admitting: Internal Medicine

## 2020-03-23 ENCOUNTER — Other Ambulatory Visit: Payer: Self-pay

## 2020-03-23 DIAGNOSIS — N6001 Solitary cyst of right breast: Secondary | ICD-10-CM | POA: Diagnosis not present

## 2020-03-23 DIAGNOSIS — R928 Other abnormal and inconclusive findings on diagnostic imaging of breast: Secondary | ICD-10-CM

## 2020-04-03 ENCOUNTER — Other Ambulatory Visit (INDEPENDENT_AMBULATORY_CARE_PROVIDER_SITE_OTHER): Payer: Medicare Other

## 2020-04-03 ENCOUNTER — Other Ambulatory Visit: Payer: Self-pay

## 2020-04-03 DIAGNOSIS — E7800 Pure hypercholesterolemia, unspecified: Secondary | ICD-10-CM | POA: Diagnosis not present

## 2020-04-03 LAB — HEPATIC FUNCTION PANEL
ALT: 11 U/L (ref 0–35)
AST: 17 U/L (ref 0–37)
Albumin: 4.1 g/dL (ref 3.5–5.2)
Alkaline Phosphatase: 83 U/L (ref 39–117)
Bilirubin, Direct: 0.1 mg/dL (ref 0.0–0.3)
Total Bilirubin: 0.6 mg/dL (ref 0.2–1.2)
Total Protein: 6.5 g/dL (ref 6.0–8.3)

## 2020-04-13 ENCOUNTER — Telehealth: Payer: Self-pay | Admitting: Internal Medicine

## 2020-04-13 ENCOUNTER — Encounter: Payer: Self-pay | Admitting: Internal Medicine

## 2020-04-13 ENCOUNTER — Telehealth (INDEPENDENT_AMBULATORY_CARE_PROVIDER_SITE_OTHER): Payer: Medicare Other | Admitting: Internal Medicine

## 2020-04-13 DIAGNOSIS — J3489 Other specified disorders of nose and nasal sinuses: Secondary | ICD-10-CM

## 2020-04-13 DIAGNOSIS — I10 Essential (primary) hypertension: Secondary | ICD-10-CM

## 2020-04-13 MED ORDER — AZITHROMYCIN 250 MG PO TABS: ORAL_TABLET | ORAL | 0 refills | Status: DC

## 2020-04-13 NOTE — Telephone Encounter (Signed)
Pt scheduled with dr scott  

## 2020-04-13 NOTE — Telephone Encounter (Signed)
Patient called in wanted some thing for congestion,cough what should do

## 2020-04-13 NOTE — Progress Notes (Signed)
[name], female   DOB: 14-Feb-1941, 80 y.o.   MRN: [id number]   Virtual Visit via telephone Note  This visit type was conducted due to national recommendations for restrictions regarding the COVID-19 pandemic (e.g. social distancing).  This format is felt to be most appropriate for this patient at this time.  All issues noted in this document were discussed and addressed.  No physical exam was performed (except for noted visual exam findings with Video Visits).   I connected with Donna Lara today by telephone and verified that I am speaking with the correct person using two identifiers. Location patient: home Location provider: work  Persons participating in the virtual visit: patient, provider  The limitations, risks, security and privacy concerns of performing an evaluation and management service by telephone and the availability of in person appointments have been discussed.  It has also been discussed with the patient that there may be a patient responsible charge related to this service. The patient expressed understanding and agreed to proceed.   Reason for visit: work in appt  HPI: Reports started having symptoms over the last fw days - cough.  Some initial aching.  Aching in her shoulders and chills initially.  No fever.  No chest pain or tightness.  No sob.  No nausea or vomiting.  Does report increased sinus pressure and head feeling stopped up.  Some nasal congestion.  Some decreased appetite.  Has taken tylenol.  Denies any known covid exposure.     ROS: See pertinent positives and negatives per HPI.  Past Medical History:  Diagnosis Date  . Allergy   . Carpal tunnel syndrome 1999  . GERD (gastroesophageal reflux disease)   . Hypercholesterolemia    By last lipid panel  . Hypertension    Borderline. She does not take the 12.5 HCTZ daily    Past Surgical History:  Procedure Laterality Date  . APPENDECTOMY  1963  . CARPAL TUNNEL RELEASE     right hand  .  COLONOSCOPY    . COLONOSCOPY WITH PROPOFOL N/A 12/01/2016   Procedure: COLONOSCOPY WITH PROPOFOL;  Surgeon: Lucilla Lame, MD;  Location: [facility];  Service: Gastroenterology;  Laterality: N/A;  . EYE SURGERY     left  . POLYPECTOMY  12/01/2016   Procedure: POLYPECTOMY INTESTINAL;  Surgeon: Lucilla Lame, MD;  Location: [facility];  Service: Gastroenterology;;  Transverse colon polyp x 2 Ascending colon polyp x 3  . TONSILLECTOMY      Family History  Problem Relation Age of Onset  . Diabetes Mother   . Hypertension Brother   . Diabetes Maternal Grandmother   . Asthma Daughter   . Breast cancer Other   . Colon cancer Neg Hx     SOCIAL HX: reviewed.    Current Outpatient Medications:  .  azithromycin (ZITHROMAX) 250 MG tablet, Take 2 tablets x 1 day and then one tablet per day for four more days, Disp: 6 tablet, Rfl: 0 .  Cholecalciferol (VITAMIN D3) 1000 UNITS CAPS, Take 1,000 Units by mouth 2 (two) times daily. , Disp: , Rfl:  .  clonazePAM (KLONOPIN) 0.5 MG tablet, Take 1 tablet (0.5 mg total) by mouth daily as needed for anxiety., Disp: 30 tablet, Rfl: 0 .  co-enzyme Q-10 30 MG capsule, Take 30 mg by mouth 2 (two) times daily. , Disp: , Rfl:  .  CYANOCOBALAMIN PO, Take 1 tablet by mouth daily. , Disp: , Rfl:  .  fluticasone (FLONASE) 50  MCG/ACT nasal spray, Place 2 sprays into both nostrils daily., Disp: 16 g, Rfl: 6 .  lisinopril (ZESTRIL) 5 MG tablet, Take 1 tablet (5 mg total) by mouth daily., Disp: 90 tablet, Rfl: 1 .  magnesium 30 MG tablet, Take 30 mg by mouth 1 day or 1 dose. , Disp: , Rfl:  .  MAGNESIUM CITRATE PO, Take 1 tablet by mouth daily. , Disp: , Rfl:  .  mometasone (ELOCON) 0.1 % lotion, Apply topically daily. For no more than 7 days., Disp: 30 mL, Rfl: 0 .  Omega-3 Fatty Acids (FISH OIL) 1000 MG CAPS, Take 1,000 mg by mouth 2 (two) times daily. , Disp: , Rfl:  .  Red Yeast Rice Extract (RED YEAST RICE PO), Take 2 capsules by mouth daily. , Disp:  , Rfl:  .  rosuvastatin (CRESTOR) 5 MG tablet, Take one tablet 2x/week., Disp: 30 tablet, Rfl: 1 .  vitamin C (ASCORBIC ACID) 500 MG tablet, Take 500 mg by mouth 2 (two) times daily., Disp: , Rfl:   EXAM:  GENERAL: alert. Sounds to be in no acute distress.  Answering questions appropriately.    PSYCH/NEURO: pleasant and cooperative, no obvious depression or anxiety, speech and thought processing grossly intact  ASSESSMENT AND PLAN:  Discussed the following assessment and plan:  Problem List Items Addressed This Visit    Hypertension    On lisinopril.  Have her spot check pressure.  Follow.        Sinus pressure    Increased sinus pressure and nasal congestion.  Discussed possible covid, sinus infection, etc.  She declines covid testing.  Discussed quarantine guidelines.  Saline nasal spray and nasacort nasal spray as directed.  Robitussin DM/mucinex as directed.  rx for zpak sent in.  She will not take unless symptoms progress.  Call with update.  Follow closely.            I discussed the assessment and treatment plan with the patient. The patient was provided an opportunity to ask questions and all were answered. The patient agreed with the plan and demonstrated an understanding of the instructions.   The patient was advised to call back or seek an in-person evaluation if the symptoms worsen or if the condition fails to improve as anticipated.  I provided 25 minutes of non-face-to-face time during this encounter.   [name], MD

## 2020-04-14 ENCOUNTER — Encounter: Payer: Self-pay | Admitting: Internal Medicine

## 2020-04-14 DIAGNOSIS — J3489 Other specified disorders of nose and nasal sinuses: Secondary | ICD-10-CM | POA: Insufficient documentation

## 2020-04-14 NOTE — Assessment & Plan Note (Signed)
Increased sinus pressure and nasal congestion.  Discussed possible covid, sinus infection, etc.  She declines covid testing.  Discussed quarantine guidelines.  Saline nasal spray and nasacort nasal spray as directed.  Robitussin DM/mucinex as directed.  rx for zpak sent in.  She will not take unless symptoms progress.  Call with update.  Follow closely.

## 2020-04-14 NOTE — Assessment & Plan Note (Signed)
On lisinopril.  Have her spot check pressure.  Follow.

## 2020-04-19 ENCOUNTER — Telehealth: Payer: Self-pay | Admitting: Internal Medicine

## 2020-04-19 MED ORDER — BENZONATATE 100 MG PO CAPS: 100.0000 mg | ORAL_CAPSULE | Freq: Three times a day (TID) | ORAL | 0 refills | Status: DC | PRN

## 2020-04-19 NOTE — Telephone Encounter (Signed)
Confirm doing better, just residual cough.  No acute issues.  The zpak - she take for 5 days - but should still be working.   She should not need abx this soon.  Can let this finish working and follow symptoms and call if persistent problems.

## 2020-04-19 NOTE — Telephone Encounter (Signed)
If the cough is annoying her I can call her in something for cough.  Has she tried tessalon perles. If has not problems taking, I can call in rx for her.

## 2020-04-19 NOTE — Telephone Encounter (Signed)
Patient still has a little cough and would like some more azithromycin (ZITHROMAX) 250 MG tablet

## 2020-04-19 NOTE — Telephone Encounter (Signed)
Left message for patient to return call back.  

## 2020-04-19 NOTE — Telephone Encounter (Signed)
Pt stated that she was better & that main symptom is constant cough. I advised that abx was still working & that she should give more time. She will update [place] on her sx. She said that she has been taking Robitussin as well as Mucinex. I told patient that I would let you know about her cough annoying cough. Somehow her phone must have died & we were disconnected. I called back three times & went to her VM.

## 2020-04-19 NOTE — Telephone Encounter (Signed)
rx sent in for tessalon perles.  See phone message.

## 2020-04-19 NOTE — Telephone Encounter (Signed)
Spoken to patient, she has not had Tessalon Perles before.

## 2020-05-30 ENCOUNTER — Telehealth: Payer: Self-pay

## 2020-05-30 NOTE — Telephone Encounter (Signed)
LM for pt to return my call

## 2020-05-30 NOTE — Telephone Encounter (Signed)
Pt was sent letter for [facility] and does not think she would be able to go and would like a letter from Dr Nicki Reaper to excuse her. She would like a call back ASAP because this is time sensitive.

## 2020-05-31 NOTE — Telephone Encounter (Signed)
LM to return my call. 

## 2020-06-01 NOTE — Telephone Encounter (Signed)
For jury duty exemption, we would need a medical reason.

## 2020-06-01 NOTE — Telephone Encounter (Signed)
Spoke with patient. She sent her form in to try to exempt herself. She is going to let me know if she needs anything with [place] but she doesn't have any medical reason why she can't go for jury duty. 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 Is this something we could write a letter for if needed?

## 2020-06-04 ENCOUNTER — Ambulatory Visit: Payer: Medicare Other | Admitting: Obstetrics & Gynecology

## 2020-06-04 NOTE — Telephone Encounter (Signed)
Patient was advised of below

## 2020-06-08 ENCOUNTER — Ambulatory Visit: Payer: Medicare Other | Admitting: Obstetrics & Gynecology

## 2020-06-18 ENCOUNTER — Other Ambulatory Visit: Payer: Self-pay

## 2020-06-18 ENCOUNTER — Ambulatory Visit (INDEPENDENT_AMBULATORY_CARE_PROVIDER_SITE_OTHER): Payer: Medicare Other | Admitting: Obstetrics & Gynecology

## 2020-06-18 ENCOUNTER — Encounter: Payer: Self-pay | Admitting: Obstetrics & Gynecology

## 2020-06-18 VITALS — BP 124/74 | Ht 62.0 in

## 2020-06-18 DIAGNOSIS — N8111 Cystocele, midline: Secondary | ICD-10-CM | POA: Diagnosis not present

## 2020-06-18 DIAGNOSIS — N814 Uterovaginal prolapse, unspecified: Secondary | ICD-10-CM

## 2020-06-18 DIAGNOSIS — N816 Rectocele: Secondary | ICD-10-CM

## 2020-06-18 NOTE — Progress Notes (Signed)
HPI:      [name] is a 80 y.o. [phone] who presents today for her pessary follow up and examination related to her pelvic floor weakening.  Pt reports tolerating the pessary well with no vaginal bleeding and no vaginal discharge.  Symptoms of pelvic floor weakening have greatly improved. She is voiding and defecating without difficulty. She currently has a Ring #2 pessary.  PMHx: She  has a past medical history of Allergy, Carpal tunnel syndrome (1999), GERD (gastroesophageal reflux disease), Hypercholesterolemia, and Hypertension. Also,  has a past surgical history that includes Appendectomy (1963); Tonsillectomy; Eye surgery; Carpal tunnel release; Colonoscopy; Colonoscopy with propofol (N/A, 12/01/2016); and Polypectomy (12/01/2016)., family history includes Asthma in her daughter; Breast cancer in an other family member; Diabetes in her maternal grandmother and mother; Hypertension in her brother.,  reports that she has never smoked. She has never used smokeless tobacco. She reports current alcohol use. She reports that she does not use drugs.  She has a current medication list which includes the following prescription(s): azithromycin, benzonatate, vitamin d3, clonazepam, co-enzyme q-10, cyanocobalamin, fluticasone, lisinopril, magnesium, magnesium citrate, mometasone, fish oil, red yeast rice extract, rosuvastatin, and vitamin c. Also, is allergic to codeine sulfate.  Review of Systems  All other systems reviewed and are negative.   Objective: BP 124/74   Ht 5\' 2"  (1.575 m)   LMP 04/28/1992   BMI 24.33 kg/m  Physical Exam Constitutional:      General: She is not in acute distress.    Appearance: She is well-developed.  Genitourinary:     Bladder and urethral meatus normal.     No vaginal erythema or bleeding.     Mild vaginal atrophy present.     Right Adnexa: not tender and no mass present.    Left Adnexa: not tender and no mass present.    No cervical motion tenderness,  discharge, polyp or nabothian cyst.     Uterus is prolapsed.     Uterus is not enlarged.     No uterine mass detected.    Uterus exam comments:  Uterus and cervix small yet mobile with Gr 3 prolapse Also has Gr 2 cystocele and Gr 1 rectocele No bleeding or lesions   .     Uterus is midaxial.     Pelvic exam was performed with patient in the lithotomy position.  HENT:     Head: Normocephalic and atraumatic.     Nose: Nose normal.  Abdominal:     General: There is no distension.     Palpations: Abdomen is soft.     Tenderness: There is no abdominal tenderness.  Musculoskeletal:        General: Normal range of motion.  Neurological:     Mental Status: She is alert and oriented to person, place, and time.     Cranial Nerves: No cranial nerve deficit.  Skin:    General: Skin is warm and dry.  Psychiatric:        Attention and Perception: Attention normal.        Mood and Affect: Mood and affect normal.        Speech: Speech normal.        Behavior: Behavior normal.        Thought Content: Thought content normal.        Judgment: Judgment normal.     Pessary Care Pessary removed and cleaned.  Vagina checked - without erosions - pessary replaced.  A/P:   ICD-10-CM  1. Uterine prolapse  N81.4   2. Cystocele, midline  N81.11   3. Rectocele  N81.6    Pessary was cleaned and replaced today. Instructions given for care. Concerning symptoms to observe for are counseled to patient. Follow up scheduled for 3 months.  A total of 20 minutes were spent face-to-face with the patient as well as preparation, review, communication, and documentation during this encounter.   Barnett Applebaum, MD, [facility], [facility] 06/18/2020  11:26 AM

## 2020-06-20 ENCOUNTER — Ambulatory Visit (INDEPENDENT_AMBULATORY_CARE_PROVIDER_SITE_OTHER): Payer: Medicare Other | Admitting: Internal Medicine

## 2020-06-20 ENCOUNTER — Other Ambulatory Visit: Payer: Self-pay

## 2020-06-20 VITALS — BP 138/78 | HR 82 | Temp 98.1°F | Resp 16 | Ht 62.0 in | Wt 127.0 lb

## 2020-06-20 DIAGNOSIS — F419 Anxiety disorder, unspecified: Secondary | ICD-10-CM | POA: Diagnosis not present

## 2020-06-20 DIAGNOSIS — R739 Hyperglycemia, unspecified: Secondary | ICD-10-CM | POA: Diagnosis not present

## 2020-06-20 DIAGNOSIS — F439 Reaction to severe stress, unspecified: Secondary | ICD-10-CM | POA: Diagnosis not present

## 2020-06-20 DIAGNOSIS — E7800 Pure hypercholesterolemia, unspecified: Secondary | ICD-10-CM

## 2020-06-20 DIAGNOSIS — Z1231 Encounter for screening mammogram for malignant neoplasm of breast: Secondary | ICD-10-CM

## 2020-06-20 DIAGNOSIS — H34219 Partial retinal artery occlusion, unspecified eye: Secondary | ICD-10-CM | POA: Diagnosis not present

## 2020-06-20 DIAGNOSIS — I10 Essential (primary) hypertension: Secondary | ICD-10-CM | POA: Diagnosis not present

## 2020-06-20 DIAGNOSIS — R413 Other amnesia: Secondary | ICD-10-CM | POA: Diagnosis not present

## 2020-06-20 LAB — HEPATIC FUNCTION PANEL
ALT: 14 U/L (ref 0–35)
AST: 20 U/L (ref 0–37)
Albumin: 4.2 g/dL (ref 3.5–5.2)
Alkaline Phosphatase: 81 U/L (ref 39–117)
Bilirubin, Direct: 0.1 mg/dL (ref 0.0–0.3)
Total Bilirubin: 0.7 mg/dL (ref 0.2–1.2)
Total Protein: 6.9 g/dL (ref 6.0–8.3)

## 2020-06-20 LAB — BASIC METABOLIC PANEL
BUN: 19 mg/dL (ref 6–23)
CO2: 29 mEq/L (ref 19–32)
Calcium: 10 mg/dL (ref 8.4–10.5)
Chloride: 102 mEq/L (ref 96–112)
Creatinine, Ser: 0.83 mg/dL (ref 0.40–1.20)
GFR: 68.03 mL/min (ref >60.00)
Glucose, Bld: 90 mg/dL (ref 70–99)
Potassium: 4.3 mEq/L (ref 3.5–5.1)
Sodium: 138 mEq/L (ref 135–145)

## 2020-06-20 LAB — LIPID PANEL
Cholesterol: 206 mg/dL — ABNORMAL HIGH (ref 0–200)
HDL: 64.7 mg/dL (ref >39.00)
LDL Cholesterol: 119 mg/dL — ABNORMAL HIGH (ref 0–99)
NonHDL: 141.51
Total CHOL/HDL Ratio: 3
Triglycerides: 113 mg/dL (ref 0.0–149.0)
VLDL: 22.6 mg/dL (ref 0.0–40.0)

## 2020-06-20 LAB — VITAMIN B12: Vitamin B-12: 329 pg/mL (ref 211–911)

## 2020-06-20 LAB — HEMOGLOBIN A1C: Hgb A1c MFr Bld: 5.5 % (ref 4.6–6.5)

## 2020-06-20 LAB — TSH: TSH: 1.15 u[IU]/mL (ref 0.35–4.50)

## 2020-06-20 NOTE — Progress Notes (Signed)
[name], female   DOB: 05-Apr-1941, 80 y.o.   MRN: [id number]   Subjective:    [name], female    DOB: 03-03-1941, 80 y.o.   MRN: [id number]  HPI This visit occurred during the SARS-CoV-2 public health emergency.  Safety protocols were in place, including screening questions prior to the visit, additional usage of staff PPE, and extensive cleaning of exam room while observing appropriate contact time as indicated for disinfecting solutions.  Patient here for a scheduled follow up. Here to follow up regarding her blood pressure and cholesterol.  She is doing well.  No chest pain or sob.  No cough or congestion.  No acid reflux.  No abdominal pain.  Bowels moving.  States blood pressure at home looks good.  123/80.  She is not taking lisinopril.  Taking crestor - only q week.  Watching her diet.  Has lost weight.  Exercising.  Handling stress.    Past Medical History:  Diagnosis Date  . Allergy   . Carpal tunnel syndrome 1999  . GERD (gastroesophageal reflux disease)   . Hypercholesterolemia    By last lipid panel  . Hypertension    Borderline. She does not take the 12.5 HCTZ daily   Past Surgical History:  Procedure Laterality Date  . APPENDECTOMY  1963  . CARPAL TUNNEL RELEASE     right hand  . COLONOSCOPY    . COLONOSCOPY WITH PROPOFOL N/A 12/01/2016   Procedure: COLONOSCOPY WITH PROPOFOL;  Surgeon: Lucilla Lame, MD;  Location: [place];  Service: Gastroenterology;  Laterality: N/A;  . EYE SURGERY     left  . POLYPECTOMY  12/01/2016   Procedure: POLYPECTOMY INTESTINAL;  Surgeon: Lucilla Lame, MD;  Location: [place];  Service: Gastroenterology;;  Transverse colon polyp x 2 Ascending colon polyp x 3  . TONSILLECTOMY     Family History  Problem Relation Age of Onset  . Diabetes Mother   . Hypertension Brother   . Diabetes Maternal Grandmother   . Asthma Daughter   . Breast cancer Other   . Colon cancer Neg Hx    Social History    Socioeconomic History  . Marital status: Married    Spouse name: Not on file  . Number of children: 3  . Years of education: Not on file  . Highest education level: Not on file  Occupational History    Employer: OTHER  Tobacco Use  . Smoking status: Never Smoker  . Smokeless tobacco: Never Used  Vaping Use  . Vaping Use: Never used  Substance and Sexual Activity  . Alcohol use: Yes    Alcohol/week: 0.0 standard drinks    Comment: 2-3 drinks/month  . Drug use: No  . Sexual activity: Never  Other Topics Concern  . Not on file  Social History Narrative   Recently widowed. Her husband died of a heart attack in May 30, 2015   Social Determinants of Health   Financial Resource Strain: Not on file  Food Insecurity: Not on file  Transportation Needs: Not on file  Physical Activity: Not on file  Stress: Not on file  Social Connections: Not on file    Outpatient Encounter Medications as of 06/20/2020  Medication Sig  . Cholecalciferol (VITAMIN D3) 1000 UNITS CAPS Take 1,000 Units by mouth 2 (two) times daily.   . clonazePAM (KLONOPIN) 0.5 MG tablet Take 1 tablet (0.5 mg total) by mouth daily as needed for anxiety.  Marland Kitchen co-enzyme Q-10  30 MG capsule Take 30 mg by mouth 2 (two) times daily.   . CYANOCOBALAMIN PO Take 1 tablet by mouth daily.   . fluticasone (FLONASE) 50 MCG/ACT nasal spray Place 2 sprays into both nostrils daily.  Marland Kitchen lisinopril (ZESTRIL) 5 MG tablet Take 1 tablet (5 mg total) by mouth daily.  . magnesium 30 MG tablet Take 30 mg by mouth 1 day or 1 dose.   Marland Kitchen MAGNESIUM CITRATE PO Take 1 tablet by mouth daily.   . mometasone (ELOCON) 0.1 % lotion Apply topically daily. For no more than 7 days.  . Omega-3 Fatty Acids (FISH OIL) 1000 MG CAPS Take 1,000 mg by mouth 2 (two) times daily.   . Red Yeast Rice Extract (RED YEAST RICE PO) Take 2 capsules by mouth daily.   . rosuvastatin (CRESTOR) 5 MG tablet Take one tablet 2x/week.  . vitamin C (ASCORBIC ACID) 500 MG tablet  Take 500 mg by mouth 2 (two) times daily.  . [DISCONTINUED] azithromycin (ZITHROMAX) 250 MG tablet Take 2 tablets x 1 day and then one tablet per day for four more days  . [DISCONTINUED] benzonatate (TESSALON) 100 MG capsule Take 1 capsule (100 mg total) by mouth 3 (three) times daily as needed for cough.   No facility-administered encounter medications on file as of 06/20/2020.    Review of Systems  Constitutional: Negative for appetite change and unexpected weight change.  HENT: Negative for congestion and sinus pressure.   Respiratory: Negative for cough, chest tightness and shortness of breath.   Cardiovascular: Negative for chest pain, palpitations and leg swelling.  Gastrointestinal: Negative for abdominal pain, diarrhea, nausea and vomiting.  Genitourinary: Negative for difficulty urinating and dysuria.  Musculoskeletal: Negative for joint swelling and myalgias.  Skin: Negative for color change and rash.  Neurological: Negative for dizziness, light-headedness and headaches.  Psychiatric/Behavioral: Negative for agitation and dysphoric mood.       Objective:    Physical Exam Vitals reviewed.  Constitutional:      General: She is not in acute distress.    Appearance: Normal appearance.  HENT:     Head: Normocephalic and atraumatic.     Right Ear: External ear normal.     Left Ear: External ear normal.  Eyes:     General: No scleral icterus.       Right eye: No discharge.        Left eye: No discharge.     Conjunctiva/sclera: Conjunctivae normal.  Neck:     Thyroid: No thyromegaly.  Cardiovascular:     Rate and Rhythm: Normal rate and regular rhythm.  Pulmonary:     Effort: No respiratory distress.     Breath sounds: Normal breath sounds. No wheezing.  Abdominal:     General: Bowel sounds are normal.     Palpations: Abdomen is soft.     Tenderness: There is no abdominal tenderness.  Musculoskeletal:        General: No swelling or tenderness.     Cervical back:  Neck supple. No tenderness.  Lymphadenopathy:     Cervical: No cervical adenopathy.  Skin:    Findings: No erythema or rash.  Neurological:     Mental Status: She is alert.  Psychiatric:        Mood and Affect: Mood normal.        Behavior: Behavior normal.     BP 138/78   Pulse 82   Temp 98.1 F (36.7 C) (Oral)   Resp 16  Ht 5' 2" (1.575 m)   Wt 127 lb (57.6 kg)   LMP 04/28/1992   SpO2 98%   BMI 23.23 kg/m  Wt Readings from Last 3 Encounters:  06/20/20 127 lb (57.6 kg)  04/13/20 133 lb (60.3 kg)  03/05/20 137 lb (62.1 kg)     Lab Results  Component Value Date   WBC 6.3 01/06/2020   HGB 13.7 01/06/2020   HCT 41.5 01/06/2020   PLT 235.0 01/06/2020   GLUCOSE 90 06/20/2020   CHOL 206 (H) 06/20/2020   TRIG 113.0 06/20/2020   HDL 64.70 06/20/2020   LDLCALC 119 (H) 06/20/2020   ALT 14 06/20/2020   AST 20 06/20/2020   NA 138 06/20/2020   K 4.3 06/20/2020   CL 102 06/20/2020   CREATININE 0.83 06/20/2020   BUN 19 06/20/2020   CO2 29 06/20/2020   TSH 1.15 06/20/2020   HGBA1C 5.5 06/20/2020    US BREAST LTD UNI RIGHT INC AXILLA  Result Date: 03/23/2020 CLINICAL DATA:  Six-month follow-up for a probable cyst which was aspirated on 07/13/2019. EXAM: ULTRASOUND OF THE RIGHT BREAST COMPARISON:  Previous exam(s). FINDINGS: Ultrasound targeted to the retroareolar right breast at 11 o'clock demonstrates normal fibroglandular tissue. The cyst previously seen has not reaccumulated. IMPRESSION: Resolution of the retroareolar right breast cyst at 11 o'clock. RECOMMENDATION: Return to routine screening mammography is recommended. The patient will be due for screening in March of 2022. I have discussed the findings and recommendations with the patient. If applicable, a reminder letter will be sent to the patient regarding the next appointment. BI-RADS CATEGORY  1: Negative. Electronically Signed   By: Ammie Ferrier M.D.   On: 03/23/2020 09:26       Assessment & Plan:    Problem List Items Addressed This Visit    Anxiety    She feels she is handling things well.  Follow.        Hollenhorst plaque    Continue crestor.       Hypercholesterolemia    On crestor.  States may be taking q week.  Discussed importance of taking.  Discussed calculated cholesterol risk.  Check lipid panel and liver function tests.  She has adjusted her diet and lost weight.  Follow lipid panel and liver function tests.        Relevant Orders   Hepatic function panel (Completed)   Lipid panel (Completed)   Hyperglycemia    Low carb diet and exercise.  Follow met b and a1c.       Relevant Orders   Hemoglobin A1c (Completed)   Hypertension    On lisinopril.  Blood pressure as outlined. Reports better on outside checks.  Not taking lisinopril.  Desires not to take.  Follow pressures.  Follow metabolic panel.       Relevant Orders   Basic metabolic panel (Completed)   Memory change   Relevant Orders   Vitamin B12 (Completed)   TSH (Completed)   Stress    Handling stress.  Does not feel needs any further intervention.  Follow.        Other Visit Diagnoses    Encounter for screening mammogram for malignant neoplasm of breast    -  Primary   Relevant Orders   MM 3D SCREEN BREAST BILATERAL       [name], MD

## 2020-06-24 ENCOUNTER — Encounter: Payer: Self-pay | Admitting: Internal Medicine

## 2020-06-24 NOTE — Assessment & Plan Note (Signed)
Continue crestor 

## 2020-06-24 NOTE — Assessment & Plan Note (Signed)
Low carb diet and exercise.  Follow met b and a1c.  

## 2020-06-24 NOTE — Assessment & Plan Note (Signed)
She feels she is handling things well.  Follow.

## 2020-06-24 NOTE — Assessment & Plan Note (Signed)
On lisinopril.  Blood pressure as outlined. Reports better on outside checks.  Not taking lisinopril.  Desires not to take.  Follow pressures.  Follow metabolic panel.

## 2020-06-24 NOTE — Assessment & Plan Note (Signed)
On crestor.  States may be taking q week.  Discussed importance of taking.  Discussed calculated cholesterol risk.  Check lipid panel and liver function tests.  She has adjusted her diet and lost weight.  Follow lipid panel and liver function tests.

## 2020-06-24 NOTE — Assessment & Plan Note (Signed)
Handling stress.  Does not feel needs any further intervention.  Follow.   

## 2020-07-06 DIAGNOSIS — H25813 Combined forms of age-related cataract, bilateral: Secondary | ICD-10-CM | POA: Diagnosis not present

## 2020-07-09 ENCOUNTER — Ambulatory Visit
Admission: RE | Admit: 2020-07-09 | Discharge: 2020-07-09 | Disposition: A | Discharge status: Home / Self Care | Payer: Medicare Other | Source: Ambulatory Visit | Attending: Internal Medicine | Admitting: Internal Medicine

## 2020-07-09 ENCOUNTER — Other Ambulatory Visit: Payer: Self-pay

## 2020-07-09 DIAGNOSIS — Z1231 Encounter for screening mammogram for malignant neoplasm of breast: Secondary | ICD-10-CM | POA: Insufficient documentation

## 2020-08-31 DIAGNOSIS — H25811 Combined forms of age-related cataract, right eye: Secondary | ICD-10-CM | POA: Diagnosis not present

## 2020-08-31 DIAGNOSIS — H524 Presbyopia: Secondary | ICD-10-CM | POA: Diagnosis not present

## 2020-08-31 DIAGNOSIS — I10 Essential (primary) hypertension: Secondary | ICD-10-CM | POA: Diagnosis not present

## 2020-08-31 DIAGNOSIS — H52221 Regular astigmatism, right eye: Secondary | ICD-10-CM | POA: Diagnosis not present

## 2020-09-18 ENCOUNTER — Ambulatory Visit (INDEPENDENT_AMBULATORY_CARE_PROVIDER_SITE_OTHER): Payer: Medicare Other | Admitting: Obstetrics & Gynecology

## 2020-09-18 ENCOUNTER — Encounter: Payer: Self-pay | Admitting: Obstetrics & Gynecology

## 2020-09-18 ENCOUNTER — Other Ambulatory Visit: Payer: Self-pay

## 2020-09-18 VITALS — BP 120/80 | Ht 62.0 in | Wt 129.0 lb

## 2020-09-18 DIAGNOSIS — N8111 Cystocele, midline: Secondary | ICD-10-CM | POA: Diagnosis not present

## 2020-09-18 DIAGNOSIS — R350 Frequency of micturition: Secondary | ICD-10-CM

## 2020-09-18 DIAGNOSIS — N816 Rectocele: Secondary | ICD-10-CM

## 2020-09-18 DIAGNOSIS — N814 Uterovaginal prolapse, unspecified: Secondary | ICD-10-CM | POA: Diagnosis not present

## 2020-09-18 NOTE — Progress Notes (Signed)
HPI:      [name] is a 80 y.o. [phone] who presents today for her pessary follow up and examination related to her pelvic floor weakening.  Pt reports tolerating the pessary well with no vaginal bleeding and occasional vaginal discharge.  Symptoms of pelvic floor weakening have greatly improved. She is voiding and defecating without difficulty, occas incontinence. She currently has a Ring #3 pessary.  PMHx: She  has a past medical history of Allergy, Carpal tunnel syndrome (1999), GERD (gastroesophageal reflux disease), Hypercholesterolemia, and Hypertension. Also,  has a past surgical history that includes Appendectomy (1963); Tonsillectomy; Eye surgery; Carpal tunnel release; Colonoscopy; Colonoscopy with propofol (N/A, 12/01/2016); Polypectomy (12/01/2016); and Breast cyst aspiration (Right, 07/12/2020)., family history includes Asthma in her daughter; Breast cancer in an other family member; Diabetes in her maternal grandmother and mother; Hypertension in her brother.,  reports that she has never smoked. She has never used smokeless tobacco. She reports current alcohol use. She reports that she does not use drugs.  She has a current medication list which includes the following prescription(s): vitamin d3, clonazepam, co-enzyme q-10, cyanocobalamin, fluticasone, lisinopril, magnesium, magnesium citrate, mometasone, fish oil, red yeast rice extract, rosuvastatin, and vitamin c. Also, is allergic to codeine sulfate.  Review of Systems  All other systems reviewed and are negative.  Objective: BP 120/80   Ht 5\' 2"  (1.575 m)   Wt 129 lb (58.5 kg)   LMP 04/28/1992   BMI 23.59 kg/m  Physical Exam Constitutional:      General: She is not in acute distress.    Appearance: She is well-developed.  Genitourinary:     No vaginal erythema or bleeding.      Right Adnexa: not tender and no mass present.    Left Adnexa: not tender and no mass present.    No cervical motion tenderness, discharge,  polyp or nabothian cyst.     Uterus is not enlarged.     No uterine mass detected.    Uterus exam comments: Uterus and cervix small yet mobile with Gr 3 prolapse Also has Gr 2 cystocele and Gr 1 rectocele Normal vag mucosa No bleeding or lesions   .  HENT:     Head: Normocephalic and atraumatic.     Nose: Nose normal.  Abdominal:     General: There is no distension.     Palpations: Abdomen is soft.     Tenderness: There is no abdominal tenderness.  Musculoskeletal:        General: Normal range of motion.  Neurological:     Mental Status: She is alert and oriented to person, place, and time.     Cranial Nerves: No cranial nerve deficit.  Skin:    General: Skin is warm and dry.  Psychiatric:        Attention and Perception: Attention normal.        Mood and Affect: Mood and affect normal.        Speech: Speech normal.        Behavior: Behavior normal.        Thought Content: Thought content normal.        Judgment: Judgment normal.    Pessary Care Pessary removed and cleaned.  Vagina checked - without erosions - pessary replaced.  A/P:   ICD-10-CM   1. Uterine prolapse  N81.4     2. Cystocele, midline  N81.11     3. Rectocele  N81.6     4. Urinary frequency  R35.0      Pessary was cleaned and replaced today. Instructions given for care. Concerning symptoms to observe for are counseled to patient. Follow up scheduled for 3 months. Continued pessary vs surgery discussed  A total of 20 minutes were spent face-to-face with the patient as well as preparation, review, communication, and documentation during this encounter.   Barnett Applebaum, MD, [facility], [facility] 09/18/2020  10:21 AM

## 2020-09-21 DIAGNOSIS — Z85828 Personal history of other malignant neoplasm of skin: Secondary | ICD-10-CM | POA: Diagnosis not present

## 2020-09-21 DIAGNOSIS — D2261 Melanocytic nevi of right upper limb, including shoulder: Secondary | ICD-10-CM | POA: Diagnosis not present

## 2020-09-21 DIAGNOSIS — D2271 Melanocytic nevi of right lower limb, including hip: Secondary | ICD-10-CM | POA: Diagnosis not present

## 2020-09-21 DIAGNOSIS — L304 Erythema intertrigo: Secondary | ICD-10-CM | POA: Diagnosis not present

## 2020-09-21 DIAGNOSIS — D225 Melanocytic nevi of trunk: Secondary | ICD-10-CM | POA: Diagnosis not present

## 2020-10-04 ENCOUNTER — Ambulatory Visit (INDEPENDENT_AMBULATORY_CARE_PROVIDER_SITE_OTHER): Payer: Medicare Other | Admitting: Internal Medicine

## 2020-10-04 ENCOUNTER — Other Ambulatory Visit: Payer: Self-pay

## 2020-10-04 ENCOUNTER — Encounter: Payer: Self-pay | Admitting: Internal Medicine

## 2020-10-04 VITALS — BP 130/78 | HR 80 | Temp 97.6°F | Ht 62.01 in | Wt 127.4 lb

## 2020-10-04 DIAGNOSIS — E7800 Pure hypercholesterolemia, unspecified: Secondary | ICD-10-CM

## 2020-10-04 DIAGNOSIS — H34219 Partial retinal artery occlusion, unspecified eye: Secondary | ICD-10-CM

## 2020-10-04 DIAGNOSIS — N644 Mastodynia: Secondary | ICD-10-CM

## 2020-10-04 DIAGNOSIS — G4700 Insomnia, unspecified: Secondary | ICD-10-CM

## 2020-10-04 DIAGNOSIS — R739 Hyperglycemia, unspecified: Secondary | ICD-10-CM

## 2020-10-04 DIAGNOSIS — I10 Essential (primary) hypertension: Secondary | ICD-10-CM | POA: Diagnosis not present

## 2020-10-04 DIAGNOSIS — F419 Anxiety disorder, unspecified: Secondary | ICD-10-CM | POA: Diagnosis not present

## 2020-10-04 MED ORDER — CLONAZEPAM 0.5 MG PO TABS: 0.5000 mg | ORAL_TABLET | Freq: Every day | ORAL | 0 refills | Status: DC | PRN

## 2020-10-04 NOTE — Progress Notes (Signed)
[name], female   DOB: 26-Jun-1940, 80 y.o.   MRN: [id number]   Subjective:    [name], female    DOB: [birth year], 80 y.o.   MRN: [id number]  HPI This visit occurred during the SARS-CoV-2 public health emergency.  Safety protocols were in place, including screening questions prior to the visit, additional usage of staff PPE, and extensive cleaning of exam room while observing appropriate contact time as indicated for disinfecting solutions.   Patient here for work in appt.  Work in with concerns regarding - funny sensation change in her right breast.   States start 2 months ago.  Is intermittent.  No palpable nodule.  No nipple discharge.  No rash.  States she is otherwise doing relatively well.  No chest pain.  Breathing stable.  Tries to stay active.  Eating.  No nausea or vomiting.  Handling stress.   Past Medical History:  Diagnosis Date   Allergy    Carpal tunnel syndrome 1999   GERD (gastroesophageal reflux disease)    Hypercholesterolemia    By last lipid panel   Hypertension    Borderline. She does not take the 12.5 HCTZ daily   Past Surgical History:  Procedure Laterality Date   APPENDECTOMY  1963   BREAST CYST ASPIRATION Right 07/12/2020   CARPAL TUNNEL RELEASE     right hand   COLONOSCOPY     COLONOSCOPY WITH PROPOFOL N/A 12/01/2016   Procedure: COLONOSCOPY WITH PROPOFOL;  Surgeon: Lucilla Lame, MD;  Location: [place];  Service: Gastroenterology;  Laterality: N/A;   EYE SURGERY     left   POLYPECTOMY  12/01/2016   Procedure: POLYPECTOMY INTESTINAL;  Surgeon: Lucilla Lame, MD;  Location: [place];  Service: Gastroenterology;;  Transverse colon polyp x 2 Ascending colon polyp x 3   TONSILLECTOMY     Family History  Problem Relation Age of Onset   Diabetes Mother    Hypertension Brother    Diabetes Maternal Grandmother    Asthma Daughter    Breast cancer Other    Colon cancer Neg Hx    Social History   Socioeconomic  History   Marital status: Married    Spouse name: Not on file   Number of children: 3   Years of education: Not on file   Highest education level: Not on file  Occupational History    Employer: OTHER  Tobacco Use   Smoking status: Never   Smokeless tobacco: Never  Vaping Use   Vaping Use: Never used  Substance and Sexual Activity   Alcohol use: Yes    Alcohol/week: 0.0 standard drinks    Comment: 2-3 drinks/month   Drug use: No   Sexual activity: Never  Other Topics Concern   Not on file  Social History Narrative   Recently widowed. Her husband died of a heart attack in May 21, 2015   Social Determinants of Health   Financial Resource Strain: Not on file  Food Insecurity: Not on file  Transportation Needs: Not on file  Physical Activity: Not on file  Stress: Not on file  Social Connections: Not on file    Review of Systems  Constitutional:  Negative for appetite change and unexpected weight change.  Respiratory:  Negative for cough, chest tightness and shortness of breath.   Cardiovascular:  Negative for chest pain, palpitations and leg swelling.  Gastrointestinal:  Negative for abdominal pain, diarrhea, nausea and vomiting.  Genitourinary:  Negative for difficulty  urinating and dysuria.  Musculoskeletal:  Negative for joint swelling and myalgias.  Skin:  Negative for color change and rash.  Neurological:  Negative for dizziness, light-headedness and headaches.  Psychiatric/Behavioral:  Negative for agitation and dysphoric mood.       Objective:    Physical Exam Vitals reviewed.  Constitutional:      General: She is not in acute distress.    Appearance: Normal appearance.  HENT:     Head: Normocephalic and atraumatic.     Right Ear: External ear normal.     Left Ear: External ear normal.  Eyes:     General: No scleral icterus.       Right eye: No discharge.        Left eye: No discharge.     Conjunctiva/sclera: Conjunctivae normal.  Neck:     Thyroid: No  thyromegaly.  Cardiovascular:     Rate and Rhythm: Normal rate and regular rhythm.  Pulmonary:     Effort: No respiratory distress.     Breath sounds: Normal breath sounds. No wheezing.     Comments: Breasts:  no nipple discharge or nipple retraction present.  Could not appreciate any distinct nodule.  "Funny sensation" right breast - no rash.  Abdominal:     General: Bowel sounds are normal.     Palpations: Abdomen is soft.     Tenderness: There is no abdominal tenderness.  Musculoskeletal:        General: No swelling or tenderness.     Cervical back: Neck supple. No tenderness.  Lymphadenopathy:     Cervical: No cervical adenopathy.  Skin:    Findings: No erythema or rash.  Neurological:     Mental Status: She is alert.  Psychiatric:        Mood and Affect: Mood normal.        Behavior: Behavior normal.    BP 130/78   Pulse 80   Temp 97.6 F (36.4 C)   Ht 5' 2.01" (1.575 m)   Wt 127 lb 6.4 oz (57.8 kg)   LMP 04/28/1992   SpO2 98%   BMI 23.30 kg/m  Wt Readings from Last 3 Encounters:  10/04/20 127 lb 6.4 oz (57.8 kg)  09/18/20 129 lb (58.5 kg)  06/20/20 127 lb (57.6 kg)    Outpatient Encounter Medications as of 10/04/2020  Medication Sig   Cholecalciferol (VITAMIN D3) 1000 UNITS CAPS Take 1,000 Units by mouth 2 (two) times daily.    co-enzyme Q-10 30 MG capsule Take 30 mg by mouth 2 (two) times daily.    CYANOCOBALAMIN PO Take 1 tablet by mouth daily.    fluticasone (FLONASE) 50 MCG/ACT nasal spray Place 2 sprays into both nostrils daily.   lisinopril (ZESTRIL) 5 MG tablet Take 1 tablet (5 mg total) by mouth daily.   magnesium 30 MG tablet Take 30 mg by mouth 1 day or 1 dose.    MAGNESIUM CITRATE PO Take 1 tablet by mouth daily.    mometasone (ELOCON) 0.1 % lotion Apply topically daily. For no more than 7 days.   Omega-3 Fatty Acids (FISH OIL) 1000 MG CAPS Take 1,000 mg by mouth 2 (two) times daily.    Red Yeast Rice Extract (RED YEAST RICE PO) Take 2 capsules by  mouth daily.    rosuvastatin (CRESTOR) 5 MG tablet Take one tablet 2x/week.   vitamin C (ASCORBIC ACID) 500 MG tablet Take 500 mg by mouth 2 (two) times daily.   [DISCONTINUED] clonazePAM (KLONOPIN) 0.5  MG tablet Take 1 tablet (0.5 mg total) by mouth daily as needed for anxiety.   clonazePAM (KLONOPIN) 0.5 MG tablet Take 1 tablet (0.5 mg total) by mouth daily as needed for anxiety.   No facility-administered encounter medications on file as of 10/04/2020.     Lab Results  Component Value Date   WBC 6.3 01/06/2020   HGB 13.7 01/06/2020   HCT 41.5 01/06/2020   PLT 235.0 01/06/2020   GLUCOSE 90 06/20/2020   CHOL 206 (H) 06/20/2020   TRIG 113.0 06/20/2020   HDL 64.70 06/20/2020   LDLCALC 119 (H) 06/20/2020   ALT 14 06/20/2020   AST 20 06/20/2020   NA 138 06/20/2020   K 4.3 06/20/2020   CL 102 06/20/2020   CREATININE 0.83 06/20/2020   BUN 19 06/20/2020   CO2 29 06/20/2020   TSH 1.15 06/20/2020   HGBA1C 5.5 06/20/2020    MM 3D SCREEN BREAST BILATERAL  Result Date: 07/10/2020 CLINICAL DATA:  Screening. EXAM: DIGITAL SCREENING BILATERAL MAMMOGRAM WITH TOMOSYNTHESIS AND CAD TECHNIQUE: Bilateral screening digital craniocaudal and mediolateral oblique mammograms were obtained. Bilateral screening digital breast tomosynthesis was performed. The images were evaluated with computer-aided detection. COMPARISON:  Previous exam(s). ACR Breast Density Category b: There are scattered areas of fibroglandular density. FINDINGS: There are no findings suspicious for malignancy. The images were evaluated with computer-aided detection. IMPRESSION: No mammographic evidence of malignancy. A result letter of this screening mammogram will be mailed directly to the patient. RECOMMENDATION: Screening mammogram in one year. (Code:SM-B-01Y) BI-RADS CATEGORY  1: Negative. Electronically Signed   By: Kristopher Oppenheim M.D.   On: 07/10/2020 12:59       Assessment & Plan:   Problem List Items Addressed This Visit      Anxiety   Relevant Medications   clonazePAM (KLONOPIN) 0.5 MG tablet   Breast tenderness - Primary    Describes a funny sensation right breast.  Exam as outlined.  Schedule diagnostic mammogram and possible ultrasound.  Further w/up pending results.         Relevant Orders   MM DIAG BREAST TOMO UNI RIGHT   US BREAST LTD UNI RIGHT INC AXILLA   Hollenhorst plaque    Continue crestor.         Hypercholesterolemia    Continue crestor.  Low cholesterol diet and exercise. Follow lipid panel and liver function tests.         Hyperglycemia    Low carb diet and exercise.  Follow met b and a1c.        Hypertension    On lisinopril.  Blood pressure as outlined.  No changes in medication.  Follow pressures.  Follow metabolic panel.        Insomnia   Relevant Medications   clonazePAM (KLONOPIN) 0.5 MG tablet     [name], MD

## 2020-10-09 ENCOUNTER — Encounter: Payer: Self-pay | Admitting: Internal Medicine

## 2020-10-09 DIAGNOSIS — N644 Mastodynia: Secondary | ICD-10-CM | POA: Insufficient documentation

## 2020-10-09 NOTE — Assessment & Plan Note (Signed)
On lisinopril.  Blood pressure as outlined.  No changes in medication.  Follow pressures.  Follow metabolic panel.

## 2020-10-09 NOTE — Assessment & Plan Note (Signed)
Low carb diet and exercise.  Follow met b and a1c.  

## 2020-10-09 NOTE — Assessment & Plan Note (Signed)
Continue crestor 

## 2020-10-09 NOTE — Assessment & Plan Note (Signed)
Continue crestor.  Low cholesterol diet and exercise. Follow lipid panel and liver function tests.   

## 2020-10-09 NOTE — Assessment & Plan Note (Signed)
Describes a funny sensation right breast.  Exam as outlined.  Schedule diagnostic mammogram and possible ultrasound.  Further w/up pending results.

## 2020-10-11 ENCOUNTER — Inpatient Hospital Stay: Admission: RE | Admit: 2020-10-11 | Payer: Federal, State, Local not specified - PPO | Source: Ambulatory Visit

## 2020-10-11 ENCOUNTER — Other Ambulatory Visit: Payer: Federal, State, Local not specified - PPO

## 2020-10-16 ENCOUNTER — Ambulatory Visit
Admission: RE | Admit: 2020-10-16 | Discharge: 2020-10-16 | Disposition: A | Discharge status: Home / Self Care | Payer: Medicare Other | Source: Ambulatory Visit | Attending: Internal Medicine | Admitting: Internal Medicine

## 2020-10-16 ENCOUNTER — Other Ambulatory Visit: Payer: Self-pay

## 2020-10-16 DIAGNOSIS — N644 Mastodynia: Secondary | ICD-10-CM | POA: Diagnosis not present

## 2020-10-16 DIAGNOSIS — R922 Inconclusive mammogram: Secondary | ICD-10-CM | POA: Diagnosis not present

## 2020-11-01 ENCOUNTER — Ambulatory Visit: Payer: Federal, State, Local not specified - PPO | Admitting: Internal Medicine

## 2020-12-21 ENCOUNTER — Other Ambulatory Visit: Payer: Self-pay

## 2020-12-21 ENCOUNTER — Ambulatory Visit (INDEPENDENT_AMBULATORY_CARE_PROVIDER_SITE_OTHER): Payer: Medicare Other | Admitting: Obstetrics & Gynecology

## 2020-12-21 ENCOUNTER — Encounter: Payer: Self-pay | Admitting: Obstetrics & Gynecology

## 2020-12-21 VITALS — BP 130/80 | Ht 62.0 in | Wt 128.0 lb

## 2020-12-21 DIAGNOSIS — N816 Rectocele: Secondary | ICD-10-CM | POA: Diagnosis not present

## 2020-12-21 DIAGNOSIS — N8111 Cystocele, midline: Secondary | ICD-10-CM

## 2020-12-21 DIAGNOSIS — N814 Uterovaginal prolapse, unspecified: Secondary | ICD-10-CM | POA: Diagnosis not present

## 2020-12-21 NOTE — Progress Notes (Signed)
HPI:      [name] is a 80 y.o. [phone] who presents today for her pessary follow up and examination related to her pelvic floor weakening.  Pt reports tolerating the pessary well with no vaginal bleeding and no vaginal discharge.  Symptoms of pelvic floor weakening have greatly improved. She is voiding and defecating without difficulty. She currently has a Ring #3 pessary.  PMHx: She  has a past medical history of Allergy, Carpal tunnel syndrome (1999), GERD (gastroesophageal reflux disease), Hypercholesterolemia, and Hypertension. Also,  has a past surgical history that includes Appendectomy (1963); Tonsillectomy; Eye surgery; Carpal tunnel release; Colonoscopy; Colonoscopy with propofol (N/A, 12/01/2016); Polypectomy (12/01/2016); and Breast cyst aspiration (Right, 07/12/2020)., family history includes Asthma in her daughter; Breast cancer in an other family member; Diabetes in her maternal grandmother and mother; Hypertension in her brother.,  reports that she has never smoked. She has never used smokeless tobacco. She reports current alcohol use. She reports that she does not use drugs.  She has a current medication list which includes the following prescription(s): vitamin d3, clonazepam, co-enzyme q-10, cyanocobalamin, fluticasone, lisinopril, magnesium, magnesium citrate, mometasone, fish oil, red yeast rice extract, rosuvastatin, and vitamin c. Also, is allergic to codeine sulfate.  Review of Systems  All other systems reviewed and are negative.  Objective: BP 130/80   Ht '5\' 2"'$  (1.575 m)   Wt 128 lb (58.1 kg)   LMP 04/28/1992   BMI 23.41 kg/m  Physical Exam Constitutional:      General: She is not in acute distress.    Appearance: She is well-developed.  Genitourinary:     Bladder normal.     Right Labia: No rash or tenderness.    Left Labia: No tenderness or rash.    No vaginal erythema or bleeding.     Anterior and posterior vaginal prolapse present.     Right Adnexa: not  tender and no mass present.    Left Adnexa: not tender and no mass present.    No cervical motion tenderness, discharge, polyp or nabothian cyst.     Uterus is prolapsed.     Uterus is not enlarged.     No uterine mass detected.    Uterus exam comments: Gr 2.     Uterus is midaxial.     Bladder exam comments: Gr2cystocele.     Pelvic exam was performed with patient in the lithotomy position.  HENT:     Head: Normocephalic and atraumatic.     Nose: Nose normal.  Abdominal:     General: There is no distension.     Palpations: Abdomen is soft.     Tenderness: There is no abdominal tenderness.  Musculoskeletal:        General: Normal range of motion.  Neurological:     Mental Status: She is alert and oriented to person, place, and time.     Cranial Nerves: No cranial nerve deficit.  Skin:    General: Skin is warm and dry.  Psychiatric:        Attention and Perception: Attention normal.        Mood and Affect: Mood and affect normal.        Speech: Speech normal.        Behavior: Behavior normal.        Thought Content: Thought content normal.        Judgment: Judgment normal.    Pessary Care Pessary removed and cleaned.  Vagina checked - without erosions -  pessary replaced.  A/P: 1. Uterine prolapse 2. Cystocele, midline 3. Rectocele Pessary was cleaned and replaced today. Instructions given for care. Concerning symptoms to observe for are counseled to patient. Follow up scheduled for 3 months.  A total of 22 minutes were spent face-to-face with the patient as well as preparation, review, communication, and documentation during this encounter.   Barnett Applebaum, MD, [facility], [facility] 12/21/2020  10:08 AM

## 2021-01-29 ENCOUNTER — Encounter: Payer: Self-pay | Admitting: Internal Medicine

## 2021-01-29 ENCOUNTER — Ambulatory Visit (INDEPENDENT_AMBULATORY_CARE_PROVIDER_SITE_OTHER): Payer: Medicare Other | Admitting: Internal Medicine

## 2021-01-29 ENCOUNTER — Other Ambulatory Visit: Payer: Self-pay

## 2021-01-29 VITALS — BP 146/82 | HR 74 | Temp 97.9°F | Resp 16 | Ht 62.0 in | Wt 129.6 lb

## 2021-01-29 DIAGNOSIS — H34219 Partial retinal artery occlusion, unspecified eye: Secondary | ICD-10-CM | POA: Diagnosis not present

## 2021-01-29 DIAGNOSIS — Z0000 Encounter for general adult medical examination without abnormal findings: Secondary | ICD-10-CM

## 2021-01-29 DIAGNOSIS — R739 Hyperglycemia, unspecified: Secondary | ICD-10-CM | POA: Diagnosis not present

## 2021-01-29 DIAGNOSIS — F439 Reaction to severe stress, unspecified: Secondary | ICD-10-CM | POA: Diagnosis not present

## 2021-01-29 DIAGNOSIS — N8111 Cystocele, midline: Secondary | ICD-10-CM

## 2021-01-29 DIAGNOSIS — I10 Essential (primary) hypertension: Secondary | ICD-10-CM | POA: Diagnosis not present

## 2021-01-29 DIAGNOSIS — E7800 Pure hypercholesterolemia, unspecified: Secondary | ICD-10-CM

## 2021-01-29 LAB — CBC WITH DIFFERENTIAL/PLATELET
Basophils Absolute: 0.1 10*3/uL (ref 0.0–0.1)
Basophils Relative: 0.9 % (ref 0.0–3.0)
Eosinophils Absolute: 0.2 10*3/uL (ref 0.0–0.7)
Eosinophils Relative: 3.6 % (ref 0.0–5.0)
HCT: 42.2 % (ref 36.0–46.0)
Hemoglobin: 14 g/dL (ref 12.0–15.0)
Lymphocytes Relative: 29.2 % (ref 12.0–46.0)
Lymphs Abs: 1.9 10*3/uL (ref 0.7–4.0)
MCHC: 33.2 g/dL (ref 30.0–36.0)
MCV: 88.9 fl (ref 78.0–100.0)
Monocytes Absolute: 0.8 10*3/uL (ref 0.1–1.0)
Monocytes Relative: 11.8 % (ref 3.0–12.0)
Neutro Abs: 3.6 10*3/uL (ref 1.4–7.7)
Neutrophils Relative %: 54.5 % (ref 43.0–77.0)
Platelets: 237 10*3/uL (ref 150.0–400.0)
RBC: 4.75 Mil/uL (ref 3.87–5.11)
RDW: 13.3 % (ref 11.5–15.5)
WBC: 6.6 10*3/uL (ref 4.0–10.5)

## 2021-01-29 LAB — HEPATIC FUNCTION PANEL
ALT: 13 U/L (ref 0–35)
AST: 22 U/L (ref 0–37)
Albumin: 4.3 g/dL (ref 3.5–5.2)
Alkaline Phosphatase: 82 U/L (ref 39–117)
Bilirubin, Direct: 0.1 mg/dL (ref 0.0–0.3)
Total Bilirubin: 0.7 mg/dL (ref 0.2–1.2)
Total Protein: 7.2 g/dL (ref 6.0–8.3)

## 2021-01-29 LAB — BASIC METABOLIC PANEL
BUN: 18 mg/dL (ref 6–23)
CO2: 27 mEq/L (ref 19–32)
Calcium: 9.7 mg/dL (ref 8.4–10.5)
Chloride: 104 mEq/L (ref 96–112)
Creatinine, Ser: 0.82 mg/dL (ref 0.40–1.20)
GFR: 68.73 mL/min (ref >60.00)
Glucose, Bld: 95 mg/dL (ref 70–99)
Potassium: 3.9 mEq/L (ref 3.5–5.1)
Sodium: 138 mEq/L (ref 135–145)

## 2021-01-29 LAB — LIPID PANEL
Cholesterol: 186 mg/dL (ref 0–200)
HDL: 67.4 mg/dL (ref >39.00)
LDL Cholesterol: 104 mg/dL — ABNORMAL HIGH (ref 0–99)
NonHDL: 118.43
Total CHOL/HDL Ratio: 3
Triglycerides: 74 mg/dL (ref 0.0–149.0)
VLDL: 14.8 mg/dL (ref 0.0–40.0)

## 2021-01-29 LAB — HEMOGLOBIN A1C: Hgb A1c MFr Bld: 5.8 % (ref 4.6–6.5)

## 2021-01-29 NOTE — Assessment & Plan Note (Signed)
Physical today 01/29/21.  Mammogram 07/10/20 - recommended f/u right breast mammogram.  F/u right breast mammogram 10/16/20 - Birads I.  Colonoscopy 11/2016.  Recommended f/u colonoscopy in 5 years.

## 2021-01-29 NOTE — Progress Notes (Signed)
[name], female   DOB: 07/26/1940, 80 y.o.   MRN: [id number]   Subjective:    [name], female    DOB: 10/10/1940, 80 y.o.   MRN: [id number]  This visit occurred during the SARS-CoV-2 public health emergency.  Safety protocols were in place, including screening questions prior to the visit, additional usage of staff PPE, and extensive cleaning of exam room while observing appropriate contact time as indicated for disinfecting solutions.    Chief Complaint  Patient presents with   Annual Exam   .   HPI She has a history of hypertension and hypercholesterolemia.  She comes in today to follow up on these issues as well as for a complete physical exam.  She is doing well.  Stays active.  No chest pain or sob.  No acid reflux.  No abdominal pain.  Bowels stable.  Will occasionally drink an herbal tea to help with bowels.  Discussed fiber.  Handling stress.  Not taking her cholesterol medication or blood pressure medication regularly.     Past Medical History:  Diagnosis Date   Allergy    Carpal tunnel syndrome 1999   GERD (gastroesophageal reflux disease)    Hypercholesterolemia    By last lipid panel   Hypertension    Borderline. She does not take the 12.5 HCTZ daily   Past Surgical History:  Procedure Laterality Date   APPENDECTOMY  1963   BREAST CYST ASPIRATION Right 07/12/2020   CARPAL TUNNEL RELEASE     right hand   COLONOSCOPY     COLONOSCOPY WITH PROPOFOL N/A 12/01/2016   Procedure: COLONOSCOPY WITH PROPOFOL;  Surgeon: Wohl, Darren, MD;  Location: [facility];  Service: Gastroenterology;  Laterality: N/A;   EYE SURGERY     left   POLYPECTOMY  12/01/2016   Procedure: POLYPECTOMY INTESTINAL;  Surgeon: Wohl, Darren, MD;  Location: [facility];  Service: Gastroenterology;;  Transverse colon polyp x 2 Ascending colon polyp x 3   TONSILLECTOMY     Family History  Problem Relation Age of Onset   Diabetes Mother    Hypertension Brother     Diabetes Maternal Grandmother    Asthma Daughter    Breast cancer Other    Colon cancer Neg Hx    Social History   Socioeconomic History   Marital status: Married    Spouse name: Not on file   Number of children: 3   Years of education: Not on file   Highest education level: Not on file  Occupational History    Employer: OTHER  Tobacco Use   Smoking status: Never   Smokeless tobacco: Never  Vaping Use   Vaping Use: Never used  Substance and Sexual Activity   Alcohol use: Yes    Alcohol/week: 0.0 standard drinks    Comment: 2-3 drinks/month   Drug use: No   Sexual activity: Never  Other Topics Concern   Not on file  Social History Narrative   Recently widowed. Her husband died of a heart attack in January 2017   Social Determinants of Health   Financial Resource Strain: Not on file  Food Insecurity: Not on file  Transportation Needs: Not on file  Physical Activity: Not on file  Stress: Not on file  Social Connections: Not on file     Review of Systems  Constitutional:  Negative for appetite change and unexpected weight change.  HENT:  Negative for congestion, sinus pressure and sore throat.     Eyes:  Negative for pain and visual disturbance.  Respiratory:  Negative for cough, chest tightness and shortness of breath.   Cardiovascular:  Negative for chest pain, palpitations and leg swelling.  Gastrointestinal:  Negative for abdominal pain, diarrhea, nausea and vomiting.  Genitourinary:  Negative for difficulty urinating and dysuria.  Musculoskeletal:  Negative for joint swelling and myalgias.  Skin:  Negative for color change and rash.  Neurological:  Negative for dizziness, light-headedness and headaches.  Hematological:  Negative for adenopathy. Does not bruise/bleed easily.  Psychiatric/Behavioral:  Negative for agitation and dysphoric mood.       Objective:     BP (!) 146/82   Pulse 74   Temp 97.9 F (36.6 C)   Resp 16   Ht 5' 2" (1.575 m)   Wt 129  lb 9.6 oz (58.8 kg)   LMP 04/28/1992   SpO2 98%   BMI 23.70 kg/m  Wt Readings from Last 3 Encounters:  01/29/21 129 lb 9.6 oz (58.8 kg)  12/21/20 128 lb (58.1 kg)  10/04/20 127 lb 6.4 oz (57.8 kg)    Physical Exam Vitals reviewed.  Constitutional:      General: She is not in acute distress.    Appearance: Normal appearance. She is well-developed.  HENT:     Head: Normocephalic and atraumatic.     Right Ear: External ear normal.     Left Ear: External ear normal.  Eyes:     General: No scleral icterus.       Right eye: No discharge.        Left eye: No discharge.     Conjunctiva/sclera: Conjunctivae normal.  Neck:     Thyroid: No thyromegaly.  Cardiovascular:     Rate and Rhythm: Normal rate and regular rhythm.  Pulmonary:     Effort: No tachypnea, accessory muscle usage or respiratory distress.     Breath sounds: Normal breath sounds. No decreased breath sounds or wheezing.  Chest:  Breasts:    Right: No inverted nipple, mass, nipple discharge or tenderness (no axillary adenopathy).     Left: No inverted nipple, mass, nipple discharge or tenderness (no axilarry adenopathy).  Abdominal:     General: Bowel sounds are normal.     Palpations: Abdomen is soft.     Tenderness: There is no abdominal tenderness.  Musculoskeletal:        General: No swelling or tenderness.     Cervical back: Neck supple. No tenderness.  Lymphadenopathy:     Cervical: No cervical adenopathy.  Skin:    Findings: No erythema or rash.  Neurological:     Mental Status: She is alert and oriented to person, place, and time.  Psychiatric:        Mood and Affect: Mood normal.        Behavior: Behavior normal.     Outpatient Encounter Medications as of 01/29/2021  Medication Sig   Cholecalciferol (VITAMIN D3) 1000 UNITS CAPS Take 1,000 Units by mouth 2 (two) times daily.    clonazePAM (KLONOPIN) 0.5 MG tablet Take 1 tablet (0.5 mg total) by mouth daily as needed for anxiety.   co-enzyme Q-10  30 MG capsule Take 30 mg by mouth 2 (two) times daily.    CYANOCOBALAMIN PO Take 1 tablet by mouth daily.    fluticasone (FLONASE) 50 MCG/ACT nasal spray Place 2 sprays into both nostrils daily.   magnesium 30 MG tablet Take 30 mg by mouth 1 day or 1 dose.    MAGNESIUM   CITRATE PO Take 1 tablet by mouth daily.    mometasone (ELOCON) 0.1 % lotion Apply topically daily. For no more than 7 days.   Omega-3 Fatty Acids (FISH OIL) 1000 MG CAPS Take 1,000 mg by mouth 2 (two) times daily.    Red Yeast Rice Extract (RED YEAST RICE PO) Take 2 capsules by mouth daily.    vitamin C (ASCORBIC ACID) 500 MG tablet Take 500 mg by mouth 2 (two) times daily.   [DISCONTINUED] lisinopril (ZESTRIL) 5 MG tablet Take 1 tablet (5 mg total) by mouth daily.   [DISCONTINUED] rosuvastatin (CRESTOR) 5 MG tablet Take one tablet 2x/week.   No facility-administered encounter medications on file as of 01/29/2021.     Lab Results  Component Value Date   WBC 6.6 01/29/2021   HGB 14.0 01/29/2021   HCT 42.2 01/29/2021   PLT 237.0 01/29/2021   GLUCOSE 95 01/29/2021   CHOL 186 01/29/2021   TRIG 74.0 01/29/2021   HDL 67.40 01/29/2021   LDLCALC 104 (H) 01/29/2021   ALT 13 01/29/2021   AST 22 01/29/2021   NA 138 01/29/2021   K 3.9 01/29/2021   CL 104 01/29/2021   CREATININE 0.82 01/29/2021   BUN 18 01/29/2021   CO2 27 01/29/2021   TSH 1.15 06/20/2020   HGBA1C 5.8 01/29/2021    MM DIAG BREAST TOMO UNI RIGHT  Result Date: 10/16/2020 CLINICAL DATA:  Patient presents for a diagnostic right breast exam due to abnormal intermittent diffuse sensation within the right breast. No focal palpable abnormality or pain. EXAM: DIGITAL DIAGNOSTIC UNILATERAL RIGHT MAMMOGRAM WITH TOMOSYNTHESIS AND CAD TECHNIQUE: Right digital diagnostic mammography and breast tomosynthesis was performed. The images were evaluated with computer-aided detection. COMPARISON:  Previous exam(s). ACR Breast Density Category b: There are scattered areas of  fibroglandular density. FINDINGS: Examination demonstrates no focal abnormality within the right breast as findings are unchanged from the previous exams. IMPRESSION: Stable right breast mammogram. No focal abnormality to account for patient's abnormal intermittent sensory symptoms. RECOMMENDATION: Recommend continued management of patient's right breast sensory symptoms on a clinical basis. Otherwise, recommend continued annual bilateral screening mammographic follow-up as patient's next annual bilateral mammogram is due in April 2023. I have discussed the findings and recommendations with the patient. If applicable, a reminder letter will be sent to the patient regarding the next appointment. BI-RADS CATEGORY  1: Negative. Electronically Signed   By: Marin Olp M.D.   On: 10/16/2020 11:31      Assessment & Plan:   Problem List Items Addressed This Visit     Cystocele, midline    Has seen gyn and urology.  Has f/u planned with Dr Kenton Kingfisher - pessary.  Last seen 12/2020.       Health care maintenance    Physical today 01/29/21.  Mammogram 07/10/20 - recommended f/u right breast mammogram.  F/u right breast mammogram 10/16/20 - Birads I.  Colonoscopy 11/2016.  Recommended f/u colonoscopy in 5 years.        Hollenhorst plaque    Continue crestor.        Hypercholesterolemia    Not taking crestor.  Low cholesterol diet and exercise. Follow lipid panel and liver function tests.  Instructed to take her cholesterol medication as scheduled.       Relevant Orders   Hepatic function panel (Completed)   Lipid panel (Completed)   Hyperglycemia    Low carb diet and exercise.  Follow met b and a1c.       Relevant  Orders   Hemoglobin A1c (Completed)   Hypertension    On lisinopril.  Not taking regularly.  Blood pressure as outlined.  No changes in medication.  Instructed to take her medication regularly.  Follow pressures.  Follow metabolic panel.       Relevant Orders   CBC with  Differential/Platelet (Completed)   Basic metabolic panel (Completed)   Stress    Handling stress.  Does not feel needs any further intervention.  Follow.       Other Visit Diagnoses     Routine general medical examination at a health care facility    -  Primary        [name], MD

## 2021-01-30 NOTE — Progress Notes (Signed)
Left message for patient to return call back for lab results.

## 2021-02-06 ENCOUNTER — Telehealth: Payer: Self-pay | Admitting: Internal Medicine

## 2021-02-06 MED ORDER — LISINOPRIL 5 MG PO TABS: 5.0000 mg | ORAL_TABLET | Freq: Every day | ORAL | 1 refills | Status: DC

## 2021-02-06 MED ORDER — ROSUVASTATIN CALCIUM 5 MG PO TABS: ORAL_TABLET | ORAL | 1 refills | Status: DC

## 2021-02-06 NOTE — Addendum Note (Signed)
Addended by: Elpidio Galea T on: 02/06/2021 02:31 PM   Modules accepted: Orders

## 2021-02-06 NOTE — Telephone Encounter (Signed)
Patient is requesting the following refills; rosuvastatin (CRESTOR) 5 MG tablet, lisinopril (ZESTRIL) 5 MG tablet.

## 2021-02-10 ENCOUNTER — Encounter: Payer: Self-pay | Admitting: Internal Medicine

## 2021-02-10 NOTE — Assessment & Plan Note (Signed)
Handling stress.  Does not feel needs any further intervention.  Follow.   

## 2021-02-10 NOTE — Assessment & Plan Note (Signed)
On lisinopril.  Not taking regularly.  Blood pressure as outlined.  No changes in medication.  Instructed to take her medication regularly.  Follow pressures.  Follow metabolic panel.

## 2021-02-10 NOTE — Assessment & Plan Note (Signed)
Continue crestor 

## 2021-02-10 NOTE — Assessment & Plan Note (Signed)
Low carb diet and exercise.  Follow met b and a1c.  

## 2021-02-10 NOTE — Assessment & Plan Note (Signed)
Has seen gyn and urology.  Has f/u planned with Dr Harris - pessary.  Last seen 12/2020.  

## 2021-02-10 NOTE — Assessment & Plan Note (Addendum)
Not taking crestor.  Low cholesterol diet and exercise. Follow lipid panel and liver function tests.  Instructed to take her cholesterol medication as scheduled.

## 2021-03-20 ENCOUNTER — Ambulatory Visit: Payer: Medicare Other | Admitting: Obstetrics & Gynecology

## 2021-03-20 ENCOUNTER — Emergency Department
Admission: EM | Admit: 2021-03-20 | Discharge: 2021-03-20 | Disposition: A | Discharge status: Home / Self Care | Payer: Medicare Other | Attending: Emergency Medicine | Admitting: Emergency Medicine

## 2021-03-20 ENCOUNTER — Emergency Department: Payer: Medicare Other

## 2021-03-20 ENCOUNTER — Other Ambulatory Visit: Payer: Self-pay

## 2021-03-20 DIAGNOSIS — R051 Acute cough: Secondary | ICD-10-CM

## 2021-03-20 DIAGNOSIS — R059 Cough, unspecified: Secondary | ICD-10-CM | POA: Insufficient documentation

## 2021-03-20 DIAGNOSIS — I10 Essential (primary) hypertension: Secondary | ICD-10-CM | POA: Insufficient documentation

## 2021-03-20 DIAGNOSIS — Z79899 Other long term (current) drug therapy: Secondary | ICD-10-CM | POA: Diagnosis not present

## 2021-03-20 DIAGNOSIS — S0990XA Unspecified injury of head, initial encounter: Secondary | ICD-10-CM | POA: Diagnosis not present

## 2021-03-20 DIAGNOSIS — R519 Headache, unspecified: Secondary | ICD-10-CM

## 2021-03-20 DIAGNOSIS — G319 Degenerative disease of nervous system, unspecified: Secondary | ICD-10-CM | POA: Diagnosis not present

## 2021-03-20 MED ORDER — BENZONATATE 100 MG PO CAPS: 100.0000 mg | ORAL_CAPSULE | Freq: Three times a day (TID) | ORAL | 0 refills | Status: DC | PRN

## 2021-03-20 NOTE — Discharge Instructions (Signed)
Follow-up with your regular doctor if not improved in 2 to 3 days.  Return emergency department if worsening.  Take your medications as prescribed.  You may want to consider taking a home COVID test.

## 2021-03-20 NOTE — ED Notes (Signed)
See triage note. Pt stood up and hit head on cabinet 2-3 days ago. R frontal area. Pt was concerned because one other time she hit head (about 12 years ago) and she had subdural hmmg. No blood thinners. States had contusion that already resolved. No swelling or injury noted. Pt alert, oriented. Denies vomiting or vision changes. Provider at bedside.

## 2021-03-20 NOTE — ED Provider Notes (Signed)
[facility] Emergency Department Provider Note  ____________________________________________   Event Date/Time   First MD Initiated Contact with Patient 03/20/21 [id number]     (approximate)  I have reviewed the triage vital signs and the nursing notes.   HISTORY  Chief Complaint No chief complaint on file.    HPI Donna Lara is a 80 y.o. female presents emergency department complaining of headache after hitting her head on a cabinet to 3 days ago.  Patient has history of a head bleed from a injury.  This happened approximately 12 years ago.  States it feels the same.  She has been unable to sleep.  Headache is worsened.  No vomiting, fever, chills, body aches.  Past Medical History:  Diagnosis Date   Allergy    Carpal tunnel syndrome 1999   GERD (gastroesophageal reflux disease)    Hypercholesterolemia    By last lipid panel   Hypertension    Borderline. She does not take the 12.5 HCTZ daily    Patient Active Problem List   Diagnosis Date Noted   Breast tenderness 10/09/2020   Memory change 06/20/2020   Sinus pressure 04/14/2020   Abnormal mammogram 07/17/2019   Chondrocalcinosis due to pyrophosphate crystals 05/07/2018   Osteoarthritis of knee 05/07/2018   Rectocele 02/25/2018   Cystocele with incomplete uterovaginal prolapse 10/12/2017   Insomnia 08/11/2017   Anxiety 08/11/2017   Cystocele, midline 07/09/2017   Uterine prolapse 07/09/2017   Special screening for malignant neoplasms, colon    Benign neoplasm of ascending colon    Benign neoplasm of transverse colon    Cough 08/20/2015   Hollenhorst plaque 06/09/2015   Osteopenia 02/02/2015   Health care maintenance 08/06/2014   Stress 02/16/2014   Hypertension 05/03/2012   Hypercholesterolemia 05/03/2012   Hyperglycemia 05/03/2012   Environmental allergies 05/03/2012    Past Surgical History:  Procedure Laterality Date   APPENDECTOMY  1963   BREAST CYST ASPIRATION Right 07/12/2020    CARPAL TUNNEL RELEASE     right hand   COLONOSCOPY     COLONOSCOPY WITH PROPOFOL N/A 12/01/2016   Procedure: COLONOSCOPY WITH PROPOFOL;  Surgeon: Lucilla Lame, MD;  Location: [facility];  Service: Gastroenterology;  Laterality: N/A;   EYE SURGERY     left   POLYPECTOMY  12/01/2016   Procedure: POLYPECTOMY INTESTINAL;  Surgeon: Lucilla Lame, MD;  Location: [facility];  Service: Gastroenterology;;  Transverse colon polyp x 2 Ascending colon polyp x 3   TONSILLECTOMY      Prior to Admission medications   Medication Sig Start Date End Date Taking? Authorizing Provider  benzonatate (TESSALON PERLES) 100 MG capsule Take 1 capsule (100 mg total) by mouth 3 (three) times daily as needed for cough. 03/20/21 03/20/22 Yes Lashanti Chambless, Linden Dolin, PA-C  Cholecalciferol (VITAMIN D3) 1000 UNITS CAPS Take 1,000 Units by mouth 2 (two) times daily.     [provider]  clonazePAM (KLONOPIN) 0.5 MG tablet Take 1 tablet (0.5 mg total) by mouth daily as needed for anxiety. 10/04/20   [name], MD  co-enzyme Q-10 30 MG capsule Take 30 mg by mouth 2 (two) times daily.     [provider]  CYANOCOBALAMIN PO Take 1 tablet by mouth daily.     [provider]  fluticasone (FLONASE) 50 MCG/ACT nasal spray Place 2 sprays into both nostrils daily. 06/08/17   Elby Beck, FNP  lisinopril (ZESTRIL) 5 MG tablet Take 1 tablet (5 mg total) by mouth daily.  02/06/21   [name], MD  magnesium 30 MG tablet Take 30 mg by mouth 1 day or 1 dose.     [provider]  MAGNESIUM CITRATE PO Take 1 tablet by mouth daily.     [provider]  mometasone (ELOCON) 0.1 % lotion Apply topically daily. For no more than 7 days. 06/08/17   Elby Beck, FNP  Omega-3 Fatty Acids (FISH OIL) 1000 MG CAPS Take 1,000 mg by mouth 2 (two) times daily.     [provider]  Red Yeast Rice Extract (RED YEAST RICE PO) Take 2 capsules by mouth daily.     [provider]  rosuvastatin (CRESTOR) 5 MG tablet Take one tablet 2x/week. 02/06/21   [name], MD  vitamin C (ASCORBIC ACID) 500 MG tablet Take 500 mg by mouth 2 (two) times daily.    [provider]    Allergies Codeine sulfate  Family History  Problem Relation Age of Onset   Diabetes Mother    Hypertension Brother    Diabetes Maternal Grandmother    Asthma Daughter    Breast cancer Other    Colon cancer Neg Hx     Social History Social History   Tobacco Use   Smoking status: Never   Smokeless tobacco: Never  Vaping Use   Vaping Use: Never used  Substance Use Topics   Alcohol use: Yes    Alcohol/week: 0.0 standard drinks    Comment: 2-3 drinks/month   Drug use: No    Review of Systems  Constitutional: No fever/chills Eyes: No visual changes. ENT: No sore throat. Respiratory: Denies cough Cardiovascular: Denies chest pain Gastrointestinal: Denies abdominal pain Genitourinary: Negative for dysuria. Musculoskeletal: Negative for back pain. Skin: Negative for rash. Psychiatric: no mood changes,     ____________________________________________   PHYSICAL EXAM:  VITAL SIGNS: ED Triage Vitals  Enc Vitals Group     BP 03/20/21 0825 (!) 156/88     Pulse Rate 03/20/21 0825 95     Resp 03/20/21 0825 19     Temp 03/20/21 0825 99.8 F (37.7 C)     Temp Source 03/20/21 0825 Oral     SpO2 03/20/21 0825 97 %     Weight 03/20/21 0826 129 lb (58.5 kg)     Height 03/20/21 0826 5\' 2"  (1.575 m)     Head Circumference --      Peak Flow --      Pain Score 03/20/21 0825 3     Pain Loc --      Pain Edu? --      Excl. in [place]? --     Constitutional: Alert and oriented. Well appearing and in no acute distress. Eyes: Conjunctivae are normal.  Pupils pinpoint bilaterally Head: Atraumatic.  No bruising noted Nose: No congestion/rhinnorhea. Mouth/Throat: Mucous membranes are moist.   Neck:  supple no lymphadenopathy noted Cardiovascular: Normal rate,  regular rhythm. Heart sounds are normal Respiratory: Normal respiratory effort.  No retractions, lungs c t a  GU: deferred Musculoskeletal: FROM all extremities, warm and well perfused Neurologic:  Normal speech and language.  Grips equal bilaterally, cranial nerves II through XII grossly intact Skin:  Skin is warm, dry and intact. No rash noted. Psychiatric: Mood and affect are normal. Speech and behavior are normal.  ____________________________________________   LABS (all labs ordered are listed, but only abnormal results are displayed)  Labs Reviewed - No data to display ____________________________________________   ____________________________________________  RADIOLOGY  CT of  the head  ____________________________________________   PROCEDURES  Procedure(s) performed: No  Procedures    ____________________________________________   INITIAL IMPRESSION / ASSESSMENT AND PLAN / ED COURSE  Pertinent labs & imaging results that were available during my care of the patient were reviewed by me and considered in my medical decision making (see chart for details).   Patient 80 year old female presents with head injury.  Has history of head bleed from 12 years ago.  See HPI.  Physical exam shows patient be stable.  Due to her past medical history and symptoms will order CT of the head to rule out intracranial hemorrhage  CT of the head is negative, reviewed by me confirmed by radiology  Did explain the findings to the patient.  She admits to having a cough as she does have low-grade temp.  She is deferring COVID swab at this time.  I did explain to her that covid can give her a headache along with cough.  She is given medication for cough.  She is to return emergency department worsening.  Patient is in agreement treatment plan.  Discharged in stable condition  Donna Lara was evaluated in Emergency Department on 03/20/2021 for the symptoms described in the history of present  illness. She was evaluated in the context of the global COVID-19 pandemic, which necessitated consideration that the patient might be at risk for infection with the SARS-CoV-2 virus that causes COVID-19. Institutional protocols and algorithms that pertain to the evaluation of patients at risk for COVID-19 are in a state of rapid change based on information released by regulatory bodies including the CDC and federal and state organizations. These policies and algorithms were followed during the patient's care in the ED.    As part of my medical decision making, I reviewed the following data within the [place] History obtained from family, Nursing notes reviewed and incorporated, Old chart reviewed, Radiograph reviewed , Notes from prior ED visits, and [place] Controlled Substance Database  ____________________________________________   FINAL CLINICAL IMPRESSION(S) / ED DIAGNOSES  Final diagnoses:  Bad headache  Acute cough      NEW MEDICATIONS STARTED DURING THIS VISIT:  Discharge Medication List as of 03/20/2021 10:37 AM     START taking these medications   Details  benzonatate (TESSALON PERLES) 100 MG capsule Take 1 capsule (100 mg total) by mouth 3 (three) times daily as needed for cough., Starting Wed 03/20/2021, Until Thu 03/20/2022 at 2359, Normal         Note:  This document was prepared using Dragon voice recognition software and may include unintentional dictation errors.    Versie Starks, PA-C 03/20/21 1212    Harvest Dark, MD 03/20/21 1354

## 2021-03-20 NOTE — ED Triage Notes (Signed)
Pt to ED for head injury a couple days ago. Reports hit head on cabinet while standing up. Ambulatory, NAD noted No blood thinner use  C/o headache  Denies n/v No obvious injuries noted

## 2021-03-22 ENCOUNTER — Encounter: Payer: Self-pay | Admitting: Emergency Medicine

## 2021-03-22 ENCOUNTER — Emergency Department: Payer: Medicare Other

## 2021-03-22 ENCOUNTER — Other Ambulatory Visit: Payer: Self-pay

## 2021-03-22 ENCOUNTER — Observation Stay
Admission: EM | Admit: 2021-03-22 | Discharge: 2021-03-23 | Disposition: A | Discharge status: Home / Self Care | Payer: Medicare Other | Attending: Internal Medicine | Admitting: Internal Medicine

## 2021-03-22 DIAGNOSIS — N8111 Cystocele, midline: Secondary | ICD-10-CM | POA: Diagnosis present

## 2021-03-22 DIAGNOSIS — I517 Cardiomegaly: Secondary | ICD-10-CM | POA: Diagnosis not present

## 2021-03-22 DIAGNOSIS — R0602 Shortness of breath: Secondary | ICD-10-CM

## 2021-03-22 DIAGNOSIS — R296 Repeated falls: Secondary | ICD-10-CM | POA: Diagnosis not present

## 2021-03-22 DIAGNOSIS — R42 Dizziness and giddiness: Secondary | ICD-10-CM | POA: Diagnosis not present

## 2021-03-22 DIAGNOSIS — S0990XA Unspecified injury of head, initial encounter: Secondary | ICD-10-CM | POA: Diagnosis not present

## 2021-03-22 DIAGNOSIS — Z79899 Other long term (current) drug therapy: Secondary | ICD-10-CM | POA: Diagnosis not present

## 2021-03-22 DIAGNOSIS — N390 Urinary tract infection, site not specified: Secondary | ICD-10-CM | POA: Diagnosis not present

## 2021-03-22 DIAGNOSIS — H34219 Partial retinal artery occlusion, unspecified eye: Secondary | ICD-10-CM | POA: Diagnosis present

## 2021-03-22 DIAGNOSIS — R29890 Loss of height: Secondary | ICD-10-CM | POA: Diagnosis not present

## 2021-03-22 DIAGNOSIS — J101 Influenza due to other identified influenza virus with other respiratory manifestations: Principal | ICD-10-CM | POA: Diagnosis present

## 2021-03-22 DIAGNOSIS — Z20822 Contact with and (suspected) exposure to covid-19: Secondary | ICD-10-CM | POA: Insufficient documentation

## 2021-03-22 DIAGNOSIS — M2578 Osteophyte, vertebrae: Secondary | ICD-10-CM | POA: Diagnosis not present

## 2021-03-22 DIAGNOSIS — I959 Hypotension, unspecified: Secondary | ICD-10-CM | POA: Diagnosis not present

## 2021-03-22 DIAGNOSIS — N814 Uterovaginal prolapse, unspecified: Secondary | ICD-10-CM | POA: Diagnosis present

## 2021-03-22 DIAGNOSIS — R059 Cough, unspecified: Secondary | ICD-10-CM | POA: Diagnosis present

## 2021-03-22 DIAGNOSIS — R2681 Unsteadiness on feet: Secondary | ICD-10-CM | POA: Insufficient documentation

## 2021-03-22 DIAGNOSIS — I10 Essential (primary) hypertension: Secondary | ICD-10-CM | POA: Diagnosis present

## 2021-03-22 DIAGNOSIS — R55 Syncope and collapse: Secondary | ICD-10-CM | POA: Diagnosis present

## 2021-03-22 DIAGNOSIS — W1830XA Fall on same level, unspecified, initial encounter: Secondary | ICD-10-CM | POA: Diagnosis not present

## 2021-03-22 DIAGNOSIS — N3000 Acute cystitis without hematuria: Secondary | ICD-10-CM

## 2021-03-22 DIAGNOSIS — R0902 Hypoxemia: Secondary | ICD-10-CM | POA: Diagnosis not present

## 2021-03-22 DIAGNOSIS — I951 Orthostatic hypotension: Secondary | ICD-10-CM | POA: Diagnosis not present

## 2021-03-22 LAB — COMPREHENSIVE METABOLIC PANEL
ALT: 18 U/L (ref 0–44)
AST: 29 U/L (ref 15–41)
Albumin: 3.3 g/dL — ABNORMAL LOW (ref 3.5–5.0)
Alkaline Phosphatase: 68 U/L (ref 38–126)
Anion gap: 5 (ref 5–15)
BUN: 17 mg/dL (ref 8–23)
CO2: 23 mmol/L (ref 22–32)
Calcium: 8.5 mg/dL — ABNORMAL LOW (ref 8.9–10.3)
Chloride: 103 mmol/L (ref 98–111)
Creatinine, Ser: 0.69 mg/dL (ref 0.44–1.00)
GFR, Estimated: >60 mL/min (ref >60)
Glucose, Bld: 126 mg/dL — ABNORMAL HIGH (ref 70–99)
Potassium: 3.3 mmol/L — ABNORMAL LOW (ref 3.5–5.1)
Sodium: 131 mmol/L — ABNORMAL LOW (ref 135–145)
Total Bilirubin: 0.6 mg/dL (ref 0.3–1.2)
Total Protein: 6.7 g/dL (ref 6.5–8.1)

## 2021-03-22 LAB — URINALYSIS, COMPLETE (UACMP) WITH MICROSCOPIC
Bilirubin Urine: NEGATIVE
Glucose, UA: NEGATIVE mg/dL
Ketones, ur: NEGATIVE mg/dL
Nitrite: POSITIVE — AB
Specific Gravity, Urine: 1.02 (ref 1.005–1.030)
pH: 5.5 (ref 5.0–8.0)

## 2021-03-22 LAB — RESP PANEL BY RT-PCR (FLU A&B, COVID) ARPGX2
Influenza A by PCR: POSITIVE — AB
Influenza B by PCR: NEGATIVE
SARS Coronavirus 2 by RT PCR: NEGATIVE

## 2021-03-22 LAB — CBC WITH DIFFERENTIAL/PLATELET
Abs Immature Granulocytes: 0.01 10*3/uL (ref 0.00–0.07)
Basophils Absolute: 0 10*3/uL (ref 0.0–0.1)
Basophils Relative: 1 %
Eosinophils Absolute: 0 10*3/uL (ref 0.0–0.5)
Eosinophils Relative: 0 %
HCT: 42.6 % (ref 36.0–46.0)
Hemoglobin: 14.3 g/dL (ref 12.0–15.0)
Immature Granulocytes: 0 %
Lymphocytes Relative: 29 %
Lymphs Abs: 1.4 10*3/uL (ref 0.7–4.0)
MCH: 29.2 pg (ref 26.0–34.0)
MCHC: 33.6 g/dL (ref 30.0–36.0)
MCV: 86.9 fL (ref 80.0–100.0)
Monocytes Absolute: 1 10*3/uL (ref 0.1–1.0)
Monocytes Relative: 20 %
Neutro Abs: 2.5 10*3/uL (ref 1.7–7.7)
Neutrophils Relative %: 50 %
Platelets: 169 10*3/uL (ref 150–400)
RBC: 4.9 MIL/uL (ref 3.87–5.11)
RDW: 12.9 % (ref 11.5–15.5)
WBC: 5 10*3/uL (ref 4.0–10.5)
nRBC: 0 % (ref 0.0–0.2)

## 2021-03-22 LAB — TROPONIN I (HIGH SENSITIVITY)
Troponin I (High Sensitivity): 7 ng/L (ref <18)
Troponin I (High Sensitivity): 7 ng/L (ref <18)

## 2021-03-22 LAB — MAGNESIUM: Magnesium: 2.2 mg/dL (ref 1.7–2.4)

## 2021-03-22 MED ORDER — SODIUM CHLORIDE 0.9 % IV SOLN: Freq: Once | INTRAVENOUS | Status: AC

## 2021-03-22 MED ORDER — OSELTAMIVIR PHOSPHATE 75 MG PO CAPS
75.0000 mg | ORAL_CAPSULE | Freq: Once | ORAL | Status: AC
  Administered 2021-03-22: 75 mg via ORAL
  Filled 2021-03-22: qty 1

## 2021-03-22 MED ORDER — SODIUM CHLORIDE 0.9 % IV SOLN
1.0000 g | Freq: Once | INTRAVENOUS | Status: AC
  Administered 2021-03-22: 1 g via INTRAVENOUS
  Filled 2021-03-22: qty 10

## 2021-03-22 MED ORDER — OSELTAMIVIR PHOSPHATE 30 MG PO CAPS
30.0000 mg | ORAL_CAPSULE | Freq: Two times a day (BID) | ORAL | Status: DC
  Administered 2021-03-23: 09:00:00 30 mg via ORAL
  Filled 2021-03-22 (×2): qty 1

## 2021-03-22 NOTE — ED Triage Notes (Signed)
Pt to ED via [facility] with c/o dizziness and syncope. Per EMS pt was ambulating to the kitchen to eat breakfast, got to the sink, had a syncopal episode and fell straight back. Per EMS pt attempted orthostatics however when they stood patient up patient had another syncopal episode lasting approx 15 seconds. 238mL given en route.    90/57 -> 134/73 52HR 99% on 3L (pt no normally on O2)  20g to [facility]

## 2021-03-22 NOTE — H&P (Signed)
History and Physical    PEIGHTYN Lara Donna Lara:[id number] DOB: [birth year] DOA: 03/22/2021  PCP: [name], MD  Patient coming from: home   Chief Complaint: syncope  HPI: Donna Lara is a 80 y.o. female with medical history significant for htn, uterine prolapse, who presents with the above.  Cites about 2 weeks cough and fatigue. Also several weeks increased urinary frequency. No dyspnea, able to lie flat, no swelling. No chest pain, no vomiting or diarrhea. Tripped and fell 2 days ago, presented to ED, CT head negative. Today shortly after rising to stand while ambulating in kitchen syncopized, witnessed by son. No skin injury. May have hit head.   ED Course:   Labs consistent w/ uti, ceftriaxone ordered. Flu a positive. K 3.3, na 131.   Review of Systems: As per HPI otherwise 10 point review of systems negative.    Past Medical History:  Diagnosis Date   Allergy    Carpal tunnel syndrome 1999   GERD (gastroesophageal reflux disease)    Hypercholesterolemia    By last lipid panel   Hypertension    Borderline. She does not take the 12.5 HCTZ daily    Past Surgical History:  Procedure Laterality Date   APPENDECTOMY  1963   BREAST CYST ASPIRATION Right 07/12/2020   CARPAL TUNNEL RELEASE     right hand   COLONOSCOPY     COLONOSCOPY WITH PROPOFOL N/A 12/01/2016   Procedure: COLONOSCOPY WITH PROPOFOL;  Surgeon: Lucilla Lame, MD;  Location: [place];  Service: Gastroenterology;  Laterality: N/A;   EYE SURGERY     left   POLYPECTOMY  12/01/2016   Procedure: POLYPECTOMY INTESTINAL;  Surgeon: Lucilla Lame, MD;  Location: [place] [facility];  Service: Gastroenterology;;  Transverse colon polyp x 2 Ascending colon polyp x 3   TONSILLECTOMY       reports that she has never smoked. She has never used smokeless tobacco. She reports current alcohol use. She reports that she does not use drugs.  Allergies  Allergen Reactions   Codeine Sulfate     Insomnia. Out of control.     Family History  Problem Relation Age of Onset   Diabetes Mother    Hypertension Brother    Diabetes Maternal Grandmother    Asthma Daughter    Breast cancer Other    Colon cancer Neg Hx     Prior to Admission medications   Medication Sig Start Date End Date Taking? Authorizing Provider  benzonatate (TESSALON PERLES) 100 MG capsule Take 1 capsule (100 mg total) by mouth 3 (three) times daily as needed for cough. 03/20/21 03/20/22 Yes Fisher, Linden Dolin, PA-C  Cholecalciferol (VITAMIN D3) 1000 UNITS CAPS Take 1,000 Units by mouth 2 (two) times daily.    Yes [provider]  co-enzyme Q-10 30 MG capsule Take 30 mg by mouth 2 (two) times daily.    Yes [provider]  CYANOCOBALAMIN PO Take 1 tablet by mouth daily.    Yes [provider]  vitamin C (ASCORBIC ACID) 500 MG tablet Take 500 mg by mouth 2 (two) times daily.   Yes [provider]  clonazePAM (KLONOPIN) 0.5 MG tablet Take 1 tablet (0.5 mg total) by mouth daily as needed for anxiety. Patient not taking: Reported on 03/22/2021 10/04/20   [name], MD  fluticasone [facility]) 50 MCG/ACT nasal spray Place 2 sprays into both nostrils daily. Patient not taking: Reported on 03/22/2021 06/08/17   Elby Beck, FNP  lisinopril (ZESTRIL) 5  MG tablet Take 1 tablet (5 mg total) by mouth daily. Patient not taking: Reported on 03/22/2021 02/06/21   [name], MD  magnesium 30 MG tablet Take 30 mg by mouth 1 day or 1 dose.  Patient not taking: Reported on 03/22/2021    [provider]  MAGNESIUM CITRATE PO Take 1 tablet by mouth daily.  Patient not taking: Reported on 03/22/2021    [provider]  mometasone (ELOCON) 0.1 % lotion Apply topically daily. For no more than 7 days. Patient not taking: Reported on 03/22/2021 06/08/17   Elby Beck, FNP  Omega-3 Fatty Acids (FISH OIL) 1000 MG CAPS Take 1,000 mg by mouth 2 (two) times daily.  Patient not taking: Reported on  03/22/2021    [provider]  Red Yeast Rice Extract (RED YEAST RICE PO) Take 2 capsules by mouth daily.  Patient not taking: Reported on 03/22/2021    [provider]  rosuvastatin (CRESTOR) 5 MG tablet Take one tablet 2x/week. Patient not taking: Reported on 03/22/2021 02/06/21   [name], MD    Physical Exam: Vitals:   03/22/21 0942 03/22/21 0950 03/22/21 1323 03/22/21 1555  BP: (!) 144/75  (!) 159/85 (!) 159/92  Pulse: 64  72 74  Resp: 20  18 16   Temp: 98 F (36.7 C)     TempSrc: Oral     SpO2: (!) 87% 95% 97% 94%  Weight: 58.1 kg     Height: 5\' 2"  (1.575 m)       Constitutional: No acute distress Head: Atraumatic Eyes: Conjunctiva clear ENM: Moist mucous membranes. Normal dentition.  Neck: Supple Respiratory: Clear to auscultation bilaterally, no wheezing/rales/rhonchi. Normal respiratory effort. No accessory muscle use. . Cardiovascular: Regular rate and rhythm. No murmurs/rubs/gallops. Abdomen: Non-tender, non-distended. No masses. No rebound or guarding. Positive bowel sounds. Musculoskeletal: No joint deformity upper and lower extremities. Normal ROM, no contractures. Normal muscle tone.  Skin: No rashes, lesions, or ulcers.  Extremities: No peripheral edema. Palpable peripheral pulses. Neurologic: Alert, moving all 4 extremities. Psychiatric: Normal insight and judgement.   Labs on Admission: I have personally reviewed following labs and imaging studies  CBC: Recent Labs  Lab 03/22/21 0951  WBC 5.0  NEUTROABS 2.5  HGB 14.3  HCT 42.6  MCV 86.9  PLT 154   Basic Metabolic Panel: Recent Labs  Lab 03/22/21 0951  NA 131*  K 3.3*  CL 103  CO2 23  GLUCOSE 126*  BUN 17  CREATININE 0.69  CALCIUM 8.5*   GFR: Estimated Creatinine Clearance: 45.8 mL/min (by C-G formula based on SCr of 0.69 mg/dL). Liver Function Tests: Recent Labs  Lab 03/22/21 0951  AST 29  ALT 18  ALKPHOS 68  BILITOT 0.6  PROT 6.7  ALBUMIN 3.3*   No  results for input(s): LIPASE, AMYLASE in the last 168 hours. No results for input(s): AMMONIA in the last 168 hours. Coagulation Profile: No results for input(s): INR, PROTIME in the last 168 hours. Cardiac Enzymes: No results for input(s): CKTOTAL, CKMB, CKMBINDEX, TROPONINI in the last 168 hours. BNP (last 3 results) No results for input(s): PROBNP in the last 8760 hours. HbA1C: No results for input(s): HGBA1C in the last 72 hours. CBG: No results for input(s): GLUCAP in the last 168 hours. Lipid Profile: No results for input(s): CHOL, HDL, LDLCALC, TRIG, CHOLHDL, LDLDIRECT in the last 72 hours. Thyroid Function Tests: No results for input(s): TSH, T4TOTAL, FREET4, T3FREE, THYROIDAB in the last 72 hours. Anemia Panel: No results  for input(s): VITAMINB12, FOLATE, FERRITIN, TIBC, IRON, RETICCTPCT in the last 72 hours. Urine analysis:    Component Value Date/Time   COLORURINE YELLOW 03/22/2021 0947   APPEARANCEUR HAZY (A) 03/22/2021 0947   APPEARANCEUR Clear 01/06/2019 0958   LABSPEC 1.020 03/22/2021 0947   PHURINE 5.5 03/22/2021 0947   GLUCOSEU NEGATIVE 03/22/2021 0947   HGBUR TRACE (A) 03/22/2021 0947   BILIRUBINUR NEGATIVE 03/22/2021 0947   BILIRUBINUR Negative 01/06/2019 0958   KETONESUR NEGATIVE 03/22/2021 0947   PROTEINUR TRACE (A) 03/22/2021 0947   UROBILINOGEN 0.2 12/07/2018 1020   NITRITE POSITIVE (A) 03/22/2021 0947   LEUKOCYTESUR MODERATE (A) 03/22/2021 0947    Radiological Exams on Admission: DG Chest 1 View  Result Date: 03/22/2021 CLINICAL DATA:  Dizziness, syncope EXAM: CHEST  1 VIEW COMPARISON:  08/20/2015 FINDINGS: Mild cardiomegaly. Both lungs are clear. The visualized skeletal structures are unremarkable. IMPRESSION: Mild cardiomegaly without acute abnormality of the lungs in AP portable projection. Electronically Signed   By: Delanna Ahmadi M.D.   On: 03/22/2021 10:23   CT Head Wo Contrast  Result Date: 03/22/2021 CLINICAL DATA:  Fall, head injury,  struck back of head EXAM: CT HEAD WITHOUT CONTRAST CT CERVICAL SPINE WITHOUT CONTRAST TECHNIQUE: Multidetector CT imaging of the head and cervical spine was performed following the standard protocol without intravenous contrast. Multiplanar CT image reconstructions of the cervical spine were also generated. COMPARISON:  03/20/2021 FINDINGS: CT HEAD FINDINGS Brain: No evidence of acute infarction, hemorrhage, hydrocephalus, extra-axial collection or mass lesion/mass effect. Vascular: No hyperdense vessel or unexpected calcification. Skull: Normal. Negative for fracture or focal lesion. Sinuses/Orbits: No acute finding. Other: None. CT CERVICAL SPINE FINDINGS Alignment: Normal. Skull base and vertebrae: No acute fracture. No primary bone lesion or focal pathologic process. Soft tissues and spinal canal: No prevertebral fluid or swelling. No visible canal hematoma. Disc levels: Focally moderate disc space height loss and osteophytosis of C5-C6 with otherwise minimal disc space height loss. Upper chest: Negative. Other: None. IMPRESSION: 1. No acute intracranial pathology. 2. No fracture or static subluxation of the cervical spine. 3. Focally moderate disc degenerative disease C5-C6 with otherwise minimal disc space height loss. Electronically Signed   By: Delanna Ahmadi M.D.   On: 03/22/2021 11:06   CT Cervical Spine Wo Contrast  Result Date: 03/22/2021 CLINICAL DATA:  Fall, head injury, struck back of head EXAM: CT HEAD WITHOUT CONTRAST CT CERVICAL SPINE WITHOUT CONTRAST TECHNIQUE: Multidetector CT imaging of the head and cervical spine was performed following the standard protocol without intravenous contrast. Multiplanar CT image reconstructions of the cervical spine were also generated. COMPARISON:  03/20/2021 FINDINGS: CT HEAD FINDINGS Brain: No evidence of acute infarction, hemorrhage, hydrocephalus, extra-axial collection or mass lesion/mass effect. Vascular: No hyperdense vessel or unexpected calcification.  Skull: Normal. Negative for fracture or focal lesion. Sinuses/Orbits: No acute finding. Other: None. CT CERVICAL SPINE FINDINGS Alignment: Normal. Skull base and vertebrae: No acute fracture. No primary bone lesion or focal pathologic process. Soft tissues and spinal canal: No prevertebral fluid or swelling. No visible canal hematoma. Disc levels: Focally moderate disc space height loss and osteophytosis of C5-C6 with otherwise minimal disc space height loss. Upper chest: Negative. Other: None. IMPRESSION: 1. No acute intracranial pathology. 2. No fracture or static subluxation of the cervical spine. 3. Focally moderate disc degenerative disease C5-C6 with otherwise minimal disc space height loss. Electronically Signed   By: Delanna Ahmadi M.D.   On: 03/22/2021 11:06    EKG: Independently reviewed. Sinus, borderline brady  Assessment/Plan Principal Problem:   Influenza A Active Problems:   Hypertension   Hollenhorst plaque   Cystocele, midline   Uterine prolapse   Acute cystitis   Orthostatic hypotension   # Syncope # Head trauma Appears to be orthostatic syncope in the setting of advanced age, influenza, and uti. No sig conduction disturbance on ekg. CT head negative 2 days ago and CT head/neck also negative today. - treat underlying problem - maintain tele overnight - IVF - f/u orthostats - pt consult, if ambulates well tomorrow can likely discharge  # Influenza a Here initial o2 87 but think that spurious as resolved spontaneously, cxr clear - start tamiflu  # Acute cystitis 2 weeks increased frequency and change in urine color. No s/s pyelo. S/p ceftriaxone in ED - cefdinir - f/u culture  # Hyponatremia # Hypokalemia Mild, na 131 and k 3.3, likely 2/2 dehydration - IVF with 20 meq kcl - f/u Mg  # HTN Recent diagnosis, has not started lisinopril and crestor which are on med list  # GAD Says takes home clonazepam very infrequently  # Uterine prolapse # Cystocele Has  pessary. Says it is about time for change. Denies pain, bleeding, or change in normal discharge to suggest infection - outpt gyn f/u   DVT prophylaxis: lovenox Code Status: full  Family Communication: son updated telephonically  Consults called: none   Level of care: Med-Surg Status is: obs  Remains obs appropriate because: observe overnight, if ambulates well tomorrow can likely discharge        Desma Maxim MD [facility] Pager [phone]  If 7PM-7AM, please contact night-coverage [email or web address] Password [email or web address]  03/22/2021, 6:20 PM

## 2021-03-22 NOTE — ED Provider Notes (Signed)
[facility] Emergency Department Provider Note   ____________________________________________   Event Date/Time   First MD Initiated Contact with Patient 03/22/21 1651     (approximate)  I have reviewed the triage vital signs and the nursing notes.   HISTORY  Chief Complaint Loss of Consciousness    HPI Donna Lara is a 80 y.o. female who reportedly was feeling poorly.  She has been having a deep wet cough for the last few days.  It is because she is coughing up thick phlegm but has not looked at the color.  She was going to the sink to make some coffee and went over backwards.  Patient reports she remembers falling backwards.  She reportedly went straight back and hit her head.  Her son was in the kitchen and ran over to her.  She may have been out briefly.  EMS arrived and tried to do orthostatics and when she either sat up or stood up on not sure which she passed out again.  Of interest she was here 2 days ago because she fell and hit her head on the cabinet.  She does not think she passed out any of these 3 times but EMS reported that she passed out at least once.        Past Medical History:  Diagnosis Date   Allergy    Carpal tunnel syndrome 1999   GERD (gastroesophageal reflux disease)    Hypercholesterolemia    By last lipid panel   Hypertension    Borderline. She does not take the 12.5 HCTZ daily    Patient Active Problem List   Diagnosis Date Noted   Breast tenderness 10/09/2020   Memory change 06/20/2020   Sinus pressure 04/14/2020   Abnormal mammogram 07/17/2019   Chondrocalcinosis due to pyrophosphate crystals 05/07/2018   Osteoarthritis of knee 05/07/2018   Rectocele 02/25/2018   Cystocele with incomplete uterovaginal prolapse 10/12/2017   Insomnia 08/11/2017   Anxiety 08/11/2017   Cystocele, midline 07/09/2017   Uterine prolapse 07/09/2017   Special screening for malignant neoplasms, colon    Benign neoplasm  of ascending colon    Benign neoplasm of transverse colon    Cough 08/20/2015   Hollenhorst plaque 06/09/2015   Osteopenia 02/02/2015   Health care maintenance 08/06/2014   Stress 02/16/2014   Hypertension 05/03/2012   Hypercholesterolemia 05/03/2012   Hyperglycemia 05/03/2012   Environmental allergies 05/03/2012    Past Surgical History:  Procedure Laterality Date   APPENDECTOMY  1963   BREAST CYST ASPIRATION Right 07/12/2020   CARPAL TUNNEL RELEASE     right hand   COLONOSCOPY     COLONOSCOPY WITH PROPOFOL N/A 12/01/2016   Procedure: COLONOSCOPY WITH PROPOFOL;  Surgeon: Lucilla Lame, MD;  Location: [place];  Service: Gastroenterology;  Laterality: N/A;   EYE SURGERY     left   POLYPECTOMY  12/01/2016   Procedure: POLYPECTOMY INTESTINAL;  Surgeon: Lucilla Lame, MD;  Location: [place];  Service: Gastroenterology;;  Transverse colon polyp x 2 Ascending colon polyp x 3   TONSILLECTOMY      Prior to Admission medications   Medication Sig Start Date End Date Taking? Authorizing Provider  benzonatate (TESSALON PERLES) 100 MG capsule Take 1 capsule (100 mg total) by mouth 3 (three) times daily as needed for cough. 03/20/21 03/20/22  Fisher, Linden Dolin, PA-C  Cholecalciferol (VITAMIN D3) 1000 UNITS CAPS Take 1,000 Units by mouth 2 (two) times daily.     [provider]  clonazePAM (KLONOPIN) 0.5 MG tablet Take 1 tablet (0.5 mg total) by mouth daily as needed for anxiety. 10/04/20   [name], MD  co-enzyme Q-10 30 MG capsule Take 30 mg by mouth 2 (two) times daily.     [provider]  CYANOCOBALAMIN PO Take 1 tablet by mouth daily.     [provider]  fluticasone (FLONASE) 50 MCG/ACT nasal spray Place 2 sprays into both nostrils daily. 06/08/17   Elby Beck, FNP  lisinopril (ZESTRIL) 5 MG tablet Take 1 tablet (5 mg total) by mouth daily. 02/06/21   [name], MD  magnesium 30 MG tablet Take 30 mg by  mouth 1 day or 1 dose.     [provider]  MAGNESIUM CITRATE PO Take 1 tablet by mouth daily.     [provider]  mometasone (ELOCON) 0.1 % lotion Apply topically daily. For no more than 7 days. 06/08/17   Elby Beck, FNP  Omega-3 Fatty Acids (FISH OIL) 1000 MG CAPS Take 1,000 mg by mouth 2 (two) times daily.     [provider]  Red Yeast Rice Extract (RED YEAST RICE PO) Take 2 capsules by mouth daily.     [provider]  rosuvastatin (CRESTOR) 5 MG tablet Take one tablet 2x/week. 02/06/21   [name], MD  vitamin C (ASCORBIC ACID) 500 MG tablet Take 500 mg by mouth 2 (two) times daily.    [provider]    Allergies Codeine sulfate  Family History  Problem Relation Age of Onset   Diabetes Mother    Hypertension Brother    Diabetes Maternal Grandmother    Asthma Daughter    Breast cancer Other    Colon cancer Neg Hx     Social History Social History   Tobacco Use   Smoking status: Never   Smokeless tobacco: Never  Vaping Use   Vaping Use: Never used  Substance Use Topics   Alcohol use: Yes    Alcohol/week: 0.0 standard drinks    Comment: 2-3 drinks/month   Drug use: No    Review of Systems  Constitutional: No fever/chills Eyes: No visual changes. ENT: No sore throat. Cardiovascular: Denies chest pain. Respiratory: Denies shortness of breath. Gastrointestinal: No abdominal pain.  No nausea, no vomiting.  No diarrhea.  No constipation. Genitourinary: Negative for dysuria. Musculoskeletal: Negative for back pain. Skin: Negative for rash. Neurological: Negative for headaches, focal weakness  ____________________________________________   PHYSICAL EXAM:  VITAL SIGNS: ED Triage Vitals [03/22/21 0942]  Enc Vitals Group     BP (!) 144/75     Pulse Rate 64     Resp 20     Temp 98 F (36.7 C)     Temp Source Oral     SpO2 (!) 87 %     Weight 128 lb (58.1 kg)     Height 5\' 2"  (1.575 m)      Head Circumference      Peak Flow      Pain Score 0     Pain Loc      Pain Edu?      Excl. in [place]?     Constitutional: Alert and oriented. Well appearing and in no acute distress. Eyes: Conjunctivae are normal. PERRL. EOMI. Head: Atraumatic. Nose: No congestion/rhinnorhea. Mouth/Throat: Mucous membranes are moist.  Oropharynx non-erythematous. Neck: No stridor.  Cardiovascular: Normal rate, regular rhythm. Grossly normal heart sounds.  Good peripheral circulation. Respiratory: Normal respiratory effort.  No retractions. Lungs CTAB. Gastrointestinal: Soft and nontender. No distention. No abdominal bruits. No CVA tenderness. Musculoskeletal: No lower extremity tenderness nor edema.  Neurologic:  Normal speech and language. No gross focal neurologic deficits are appreciated.  Skin:  Skin is warm, dry and intact. No rash noted.   ____________________________________________   LABS (all labs ordered are listed, but only abnormal results are displayed)  Labs Reviewed  RESP PANEL BY RT-PCR (FLU A&B, COVID) ARPGX2 - Abnormal; Notable for the following components:      Result Value   Influenza A by PCR POSITIVE (*)    All other components within normal limits  COMPREHENSIVE METABOLIC PANEL - Abnormal; Notable for the following components:   Sodium 131 (*)    Potassium 3.3 (*)    Glucose, Bld 126 (*)    Calcium 8.5 (*)    Albumin 3.3 (*)    All other components within normal limits  URINALYSIS, COMPLETE (UACMP) WITH MICROSCOPIC - Abnormal; Notable for the following components:   APPearance HAZY (*)    Hgb urine dipstick TRACE (*)    Protein, ur TRACE (*)    Nitrite POSITIVE (*)    Leukocytes,Ua MODERATE (*)    Bacteria, UA MANY (*)    All other components within normal limits  CBC WITH DIFFERENTIAL/PLATELET  TROPONIN I (HIGH SENSITIVITY)  TROPONIN I (HIGH SENSITIVITY)   ____________________________________________  EKG  EKG read interpreted by me shows normal sinus rhythm  rate of 60 normal axis no acute changes.  There may be atrial enlargement. ____________________________________________  RADIOLOGY Gertha Calkin, personally viewed and evaluated these images (plain radiographs) as part of my medical decision making, as well as reviewing the written report by the radiologist.  ED MD interpretation: Chest x-ray read by radiology reviewed by me.  Radiologist feel there is mild cardiomegaly.  CT of head and C-spine read by radiology reviewed by me shows no acute changes.  Official radiology report(s): DG Chest 1 View  Result Date: 03/22/2021 CLINICAL DATA:  Dizziness, syncope EXAM: CHEST  1 VIEW COMPARISON:  08/20/2015 FINDINGS: Mild cardiomegaly. Both lungs are clear. The visualized skeletal structures are unremarkable. IMPRESSION: Mild cardiomegaly without acute abnormality of the lungs in AP portable projection. Electronically Signed   By: Delanna Ahmadi M.D.   On: 03/22/2021 10:23   CT Head Wo Contrast  Result Date: 03/22/2021 CLINICAL DATA:  Fall, head injury, struck back of head EXAM: CT HEAD WITHOUT CONTRAST CT CERVICAL SPINE WITHOUT CONTRAST TECHNIQUE: Multidetector CT imaging of the head and cervical spine was performed following the standard protocol without intravenous contrast. Multiplanar CT image reconstructions of the cervical spine were also generated. COMPARISON:  03/20/2021 FINDINGS: CT HEAD FINDINGS Brain: No evidence of acute infarction, hemorrhage, hydrocephalus, extra-axial collection or mass lesion/mass effect. Vascular: No hyperdense vessel or unexpected calcification. Skull: Normal. Negative for fracture or focal lesion. Sinuses/Orbits: No acute finding. Other: None. CT CERVICAL SPINE FINDINGS Alignment: Normal. Skull base and vertebrae: No acute fracture. No primary bone lesion or focal pathologic process. Soft tissues and spinal canal: No prevertebral fluid or swelling. No visible canal hematoma. Disc levels: Focally moderate disc space  height loss and osteophytosis of C5-C6 with otherwise minimal disc space height loss. Upper chest: Negative. Other: None. IMPRESSION: 1. No acute intracranial pathology. 2. No fracture or static subluxation of the cervical spine. 3. Focally moderate disc degenerative disease C5-C6 with otherwise minimal disc space height loss. Electronically Signed   By: Jamse Mead.D.  On: 03/22/2021 11:06   CT Cervical Spine Wo Contrast  Result Date: 03/22/2021 CLINICAL DATA:  Fall, head injury, struck back of head EXAM: CT HEAD WITHOUT CONTRAST CT CERVICAL SPINE WITHOUT CONTRAST TECHNIQUE: Multidetector CT imaging of the head and cervical spine was performed following the standard protocol without intravenous contrast. Multiplanar CT image reconstructions of the cervical spine were also generated. COMPARISON:  03/20/2021 FINDINGS: CT HEAD FINDINGS Brain: No evidence of acute infarction, hemorrhage, hydrocephalus, extra-axial collection or mass lesion/mass effect. Vascular: No hyperdense vessel or unexpected calcification. Skull: Normal. Negative for fracture or focal lesion. Sinuses/Orbits: No acute finding. Other: None. CT CERVICAL SPINE FINDINGS Alignment: Normal. Skull base and vertebrae: No acute fracture. No primary bone lesion or focal pathologic process. Soft tissues and spinal canal: No prevertebral fluid or swelling. No visible canal hematoma. Disc levels: Focally moderate disc space height loss and osteophytosis of C5-C6 with otherwise minimal disc space height loss. Upper chest: Negative. Other: None. IMPRESSION: 1. No acute intracranial pathology. 2. No fracture or static subluxation of the cervical spine. 3. Focally moderate disc degenerative disease C5-C6 with otherwise minimal disc space height loss. Electronically Signed   By: Delanna Ahmadi M.D.   On: 03/22/2021 11:06    ____________________________________________   PROCEDURES  Procedure(s) performed (including Critical  Care):  Procedures   ____________________________________________   INITIAL IMPRESSION / ASSESSMENT AND PLAN / ED COURSE  Patient was here 2 days ago.  I spoke with her granddaughter who was not aware that.  Patient herself had forgotten about it completely.  She thought maybe it was from last year.  Then when I read the chart to her she remembered.  Her son then came in and reported that the patient 2 days ago had raised up and hit her head on a cabinet and falling down.  She did not pass out that day.  Today she did pass out he heard her fall he had to turn around and when he turned around she was unconscious with her eyes open lying on the floor.  He was able to wake her up and she was okay EMS came and they got her in the chair and she passed out again.  She is now okay again.  She is not having any headache or nausea or vomiting.  She is not having any chest pain.  She is having a deep wet cough she has been having for at least the last several days.  She was hypoxic in the waiting room O2 of 88.  She is now not hypoxic on room air.  She does reports she is urinating more often than usually.  She does not remember ever having a UTI before.  She is not running a fever.  Flu test is positive.              ____________________________________________   FINAL CLINICAL IMPRESSION(S) / ED DIAGNOSES  Final diagnoses:  SOB (shortness of breath)  Influenza A  Urinary tract infection without hematuria, site unspecified  Syncope and collapse     ED Discharge Orders     None        Note:  This document was prepared using Dragon voice recognition software and may include unintentional dictation errors.    Nena Polio, MD 03/22/21 2206

## 2021-03-22 NOTE — ED Triage Notes (Signed)
Pt's RA sat 88%, pt states has had cold/flu like symptoms x several days was going to the sink to make coffee and and had a syncopal episode. Pt placed on 2L via Echo.

## 2021-03-23 DIAGNOSIS — J101 Influenza due to other identified influenza virus with other respiratory manifestations: Principal | ICD-10-CM

## 2021-03-23 LAB — BASIC METABOLIC PANEL
Anion gap: 6 (ref 5–15)
BUN: 11 mg/dL (ref 8–23)
CO2: 24 mmol/L (ref 22–32)
Calcium: 8.1 mg/dL — ABNORMAL LOW (ref 8.9–10.3)
Chloride: 106 mmol/L (ref 98–111)
Creatinine, Ser: 0.65 mg/dL (ref 0.44–1.00)
GFR, Estimated: >60 mL/min (ref >60)
Glucose, Bld: 91 mg/dL (ref 70–99)
Potassium: 4 mmol/L (ref 3.5–5.1)
Sodium: 136 mmol/L (ref 135–145)

## 2021-03-23 MED ORDER — POTASSIUM CHLORIDE IN NACL 20-0.9 MEQ/L-% IV SOLN
INTRAVENOUS | Status: DC
  Filled 2021-03-23 (×4): qty 1000

## 2021-03-23 MED ORDER — CEFDINIR 300 MG PO CAPS: 300.0000 mg | ORAL_CAPSULE | Freq: Two times a day (BID) | ORAL | 0 refills | Status: AC

## 2021-03-23 MED ORDER — SODIUM CHLORIDE 0.9 % IV BOLUS
500.0000 mL | Freq: Once | INTRAVENOUS | Status: AC
  Administered 2021-03-23: 500 mL via INTRAVENOUS

## 2021-03-23 MED ORDER — OSELTAMIVIR PHOSPHATE 75 MG PO CAPS: 75.0000 mg | ORAL_CAPSULE | Freq: Two times a day (BID) | ORAL | Status: DC

## 2021-03-23 MED ORDER — ENOXAPARIN SODIUM 40 MG/0.4ML IJ SOSY
40.0000 mg | PREFILLED_SYRINGE | INTRAMUSCULAR | Status: DC
  Administered 2021-03-23: 09:00:00 40 mg via SUBCUTANEOUS
  Filled 2021-03-23: qty 0.4

## 2021-03-23 MED ORDER — OSELTAMIVIR PHOSPHATE 30 MG PO CAPS
30.0000 mg | ORAL_CAPSULE | Freq: Two times a day (BID) | ORAL | 0 refills | Status: AC
Start: 2021-03-23 — End: 2021-03-28

## 2021-03-23 MED ORDER — CEFDINIR 300 MG PO CAPS
300.0000 mg | ORAL_CAPSULE | Freq: Two times a day (BID) | ORAL | Status: DC
  Filled 2021-03-23: qty 1

## 2021-03-23 MED ORDER — DM-GUAIFENESIN ER 30-600 MG PO TB12: 1.0000 | ORAL_TABLET | Freq: Two times a day (BID) | ORAL | 0 refills | Status: DC

## 2021-03-23 NOTE — Evaluation (Signed)
Physical Therapy Evaluation Patient Details Name: Donna Lara MRN: [id number] DOB: 06-25-1940 Today's Date: 03/23/2021  History of Present Illness  Patient is a 80 year old female who presents to [facility] on 03/22/21 due to reports on dizziness and syncope. Per EMS pt was ambulating to the kitchen to eat breakfast, got to the sink, had a syncopal episode and fell straight back. Per EMS pt attempted orthostatics however when they stood patient up patient had another syncopal episode lasting approx 15 seconds. Patient has a past medical hisotry of GERD, HTN, and Hypercholesterolemia.   Clinical Impression  Patient tolerated session well and was agreeable to treatment. Upon arriving patient was laying in bed with [facility] elevated, with daughter in and out of sleep in the chair. Prior to acute hospitalization patient was independent with ADLs and mobility, not utilizing an AD. Throughout evaluation, patient was Mod I with all bed mobility and transfers, as well as supervision with ambulation within the room with no AD. No LOB with functional activity and balance throughout session. HR ranged from 79bpm to 102 throughout session with no reports of pain, SOB, dizziness, or chest pain. Global UE and LE strength ranged from 4- to 5/5 strength. Would benefit from stair training prior to d/c with having 3 STE the home. Do not currently anticipate further PT following acute hospitalization.     Recommendations for follow up therapy are one component of a multi-disciplinary discharge planning process, led by the attending physician.  Recommendations may be updated based on patient status, additional functional criteria and insurance authorization.  Follow Up Recommendations No PT follow up    Assistance Recommended at Discharge PRN  Functional Status Assessment Patient has had a recent decline in their functional status and demonstrates the ability to make significant improvements in function in a reasonable and  predictable amount of time.  Equipment Recommendations  None recommended by PT    Recommendations for Other Services       Precautions / Restrictions Precautions Precautions: None Restrictions Weight Bearing Restrictions: No      Mobility  Bed Mobility Overal bed mobility: Modified Independent                  Transfers Overall transfer level: Modified independent Equipment used: None                    Ambulation/Gait Ambulation/Gait assistance: Supervision Gait Distance (Feet):  (x10 feet, x20 feet) Assistive device: None Gait Pattern/deviations: Step-through pattern;Decreased step length - right;Decreased step length - left Gait velocity: decreased     General Gait Details: no LOB noted, x10 feet ambulation patient ambualted while holding onto IV pole, x20 feet patient ambulated without UE support  Stairs            Wheelchair Mobility    Modified Rankin (Stroke Patients Only)       Balance Overall balance assessment: Needs assistance Sitting-balance support: No upper extremity supported;Feet unsupported Sitting balance-Leahy Scale: Good Sitting balance - Comments: can reach outside BOS with no LOB   Standing balance support: No upper extremity supported;During functional activity Standing balance-Leahy Scale: Good Standing balance comment: no LOB noted when reaching outside BOS, able to ambulate to door and back without UE assistance at supervision                             Pertinent Vitals/Pain Pain Assessment: 0-10 Pain Score: 0-No pain Pain Intervention(s): Limited activity  within patient's tolerance;Monitored during session;Repositioned    Home Living Family/patient expects to be discharged to:: Private residence Living Arrangements: Alone;Other (Comment) (Patient's son lives down the street, along with patient's sister, patient reports having very willing to help neighbors as well if she needs help at home)    Type of Home: House Home Access: Stairs to enter Entrance Stairs-Rails: Right Entrance Stairs-Number of Steps: 3 Alternate Level Stairs-Number of Steps: 12, however states does not upstairs to much, master bedroom on the first floor, Home Layout: Two level Home Equipment: Cane - single point      Prior Function Prior Level of Function : Independent/Modified Independent                     Hand Dominance   Dominant Hand: Right    Extremity/Trunk Assessment   Upper Extremity Assessment Upper Extremity Assessment: Overall WFL for tasks assessed (grossly 4- to /5 strength bilaterally)    Lower Extremity Assessment Lower Extremity Assessment: Overall WFL for tasks assessed (grossly 4- to /5 strength bilaterally)       Communication   Communication: No difficulties  Cognition Arousal/Alertness: Awake/alert Behavior During Therapy: WFL for tasks assessed/performed Overall Cognitive Status: Within Functional Limits for tasks assessed                                          General Comments      Exercises     Assessment/Plan    PT Assessment Patient does not need any further PT services  PT Problem List         PT Treatment Interventions      PT Goals (Current goals can be found in the Care Plan section)  Acute Rehab PT Goals Patient Stated Goal: to go home PT Goal Formulation: With patient/family Time For Goal Achievement: 04/06/21 Potential to Achieve Goals: Good    Frequency     Barriers to discharge        Co-evaluation               AM-PAC PT "6 Clicks" Mobility  Outcome Measure Help needed turning from your back to your side while in a flat bed without using bedrails?: None Help needed moving from lying on your back to sitting on the side of a flat bed without using bedrails?: None Help needed moving to and from a bed to a chair (including a wheelchair)?: None Help needed standing up from a chair using your arms  (e.g., wheelchair or bedside chair)?: None Help needed to walk in hospital room?: A Little Help needed climbing 3-5 steps with a railing? : A Little 6 Click Score: 22    End of Session Equipment Utilized During Treatment: Gait belt Activity Tolerance: Patient tolerated treatment well;No increased pain Patient left: in bed;with call bell/phone within reach;with bed alarm set;with family/visitor present Nurse Communication: Mobility status PT Visit Diagnosis: Dizziness and giddiness (R42);Repeated falls (R29.6);Unsteadiness on feet (R26.81)    Time: 9179-1505 PT Time Calculation (min) (ACUTE ONLY): 19 min   Charges:   PT Evaluation $PT Eval Low Complexity: 1 Low          Iva Boop, PT  03/23/21. 10:02 AM

## 2021-03-23 NOTE — Progress Notes (Signed)
Patient is being discharged home.  Discharge papers given and explained to patient.  She verbalized understanding.  Meds and f/u appointments reviewed.  Rx sent electronically to the pharmacy.  Patient made aware.

## 2021-03-23 NOTE — Discharge Summary (Signed)
Physician Discharge Summary  KOREA SEVERS MVH:[id number] DOB: 08/18/1940 DOA: 03/22/2021  PCP: [name], MD  Admit date: 03/22/2021 Discharge date: 03/23/2021  Admitted From: Home Disposition: Home  Recommendations for Outpatient Follow-up:  Follow up with PCP in 1-2 weeks Please obtain BMP/CBC in one week Please follow up on the following pending results: Final urine culture results  Home Health: No Equipment/Devices: Rolling walker Discharge Condition: Stable CODE STATUS: Full Diet recommendation: Heart Healthy   Brief/Interim Summary: Donna Lara is a 80 y.o. female with medical history significant for htn, uterine prolapse, who was admitted for concern of syncopal episode, witnessed by son.  No seizure-like activity.  Patient syncopized shortly after standing from sitting position and trying to walk.  She was having cough and fatigue for the past 2 weeks.  No acute injuries.  Imaging was without any acute abnormality or fractures.  Found to be positive for influenza A.  Urine looks infected with history of increased urinary frequency for the past 2 weeks.  No signs and symptoms of pyelonephritis.  She received ceftriaxone in ED and later transitioned to cefdinir.  Urine culture results are still pending, she was discharged on cefdinir to complete a 5-day course. She was also given a 5-day course of Tamiflu for positive and influenza A.  Initially she has positive orthostatic vitals, most likely secondary to dehydration.  Responded well to IV fluid.  Repeat orthostatic vitals with significant improvement.  She was advised to keep herself well-hydrated, take antibiotics and Tamiflu for 5 days and follow-up with her primary care doctor for further recommendations.  Discharge Diagnoses:  Principal Problem:   Influenza A Active Problems:   Hypertension   Hollenhorst plaque   Cystocele, midline   Uterine prolapse   Acute cystitis   Orthostatic hypotension    Syncope   Discharge Instructions  Discharge Instructions     Diet - low sodium heart healthy   Complete by: As directed    Discharge instructions   Complete by: As directed    It was pleasure taking care of you. You are being given Tamiflu and a course of antibiotics for UTI, please take it as directed for 5 days. Keep yourself well-hydrated. Continue taking your blood pressure and other medications. Please follow-up closely with your primary care provider for further recommendations   Increase activity slowly   Complete by: As directed       Allergies as of 03/23/2021       Reactions   Codeine Sulfate    Insomnia. Out of control.        Medication List     STOP taking these medications    clonazePAM 0.5 MG tablet Commonly known as: KLONOPIN   MAGNESIUM CITRATE PO   mometasone 0.1 % lotion Commonly known as: Elocon       TAKE these medications    benzonatate 100 MG capsule Commonly known as: Tessalon Perles Take 1 capsule (100 mg total) by mouth 3 (three) times daily as needed for cough.   cefdinir 300 MG capsule Commonly known as: OMNICEF Take 1 capsule (300 mg total) by mouth every 12 (twelve) hours for 5 days.   co-enzyme Q-10 30 MG capsule Take 30 mg by mouth 2 (two) times daily.   CYANOCOBALAMIN PO Take 1 tablet by mouth daily.   dextromethorphan-guaiFENesin 30-600 MG 12hr tablet Commonly known as: MUCINEX DM Take 1 tablet by mouth 2 (two) times daily.   Fish Oil 1000 MG Caps Take 1,000 mg by  mouth 2 (two) times daily.   fluticasone 50 MCG/ACT nasal spray Commonly known as: FLONASE Place 2 sprays into both nostrils daily.   lisinopril 5 MG tablet Commonly known as: ZESTRIL Take 1 tablet (5 mg total) by mouth daily.   magnesium 30 MG tablet Take 30 mg by mouth 1 day or 1 dose.   oseltamivir 30 MG capsule Commonly known as: TAMIFLU Take 1 capsule (30 mg total) by mouth 2 (two) times daily for 5 days.   RED YEAST RICE PO Take 2  capsules by mouth daily.   rosuvastatin 5 MG tablet Commonly known as: Crestor Take one tablet 2x/week.   vitamin C 500 MG tablet Commonly known as: ASCORBIC ACID Take 500 mg by mouth 2 (two) times daily.   Vitamin D3 25 MCG (1000 UT) Caps Take 1,000 Units by mouth 2 (two) times daily.        Follow-up Information     [name], MD. Schedule an appointment as soon as possible for a visit in 1 week(s).   Specialty: Internal Medicine Contact information: [address] [place] [address] [phone]                Allergies  Allergen Reactions   Codeine Sulfate     Insomnia. Out of control.    Consultations: None  Procedures/Studies: DG Chest 1 View  Result Date: 03/22/2021 CLINICAL DATA:  Dizziness, syncope EXAM: CHEST  1 VIEW COMPARISON:  08/20/2015 FINDINGS: Mild cardiomegaly. Both lungs are clear. The visualized skeletal structures are unremarkable. IMPRESSION: Mild cardiomegaly without acute abnormality of the lungs in AP portable projection. Electronically Signed   By: Delanna Ahmadi M.D.   On: 03/22/2021 10:23   CT Head Wo Contrast  Result Date: 03/22/2021 CLINICAL DATA:  Fall, head injury, struck back of head EXAM: CT HEAD WITHOUT CONTRAST CT CERVICAL SPINE WITHOUT CONTRAST TECHNIQUE: Multidetector CT imaging of the head and cervical spine was performed following the standard protocol without intravenous contrast. Multiplanar CT image reconstructions of the cervical spine were also generated. COMPARISON:  03/20/2021 FINDINGS: CT HEAD FINDINGS Brain: No evidence of acute infarction, hemorrhage, hydrocephalus, extra-axial collection or mass lesion/mass effect. Vascular: No hyperdense vessel or unexpected calcification. Skull: Normal. Negative for fracture or focal lesion. Sinuses/Orbits: No acute finding. Other: None. CT CERVICAL SPINE FINDINGS Alignment: Normal. Skull base and vertebrae: No acute fracture. No primary bone lesion  or focal pathologic process. Soft tissues and spinal canal: No prevertebral fluid or swelling. No visible canal hematoma. Disc levels: Focally moderate disc space height loss and osteophytosis of C5-C6 with otherwise minimal disc space height loss. Upper chest: Negative. Other: None. IMPRESSION: 1. No acute intracranial pathology. 2. No fracture or static subluxation of the cervical spine. 3. Focally moderate disc degenerative disease C5-C6 with otherwise minimal disc space height loss. Electronically Signed   By: Delanna Ahmadi M.D.   On: 03/22/2021 11:06   CT HEAD WO CONTRAST (5MM)  Result Date: 03/20/2021 CLINICAL DATA:  Blunt trauma EXAM: CT HEAD WITHOUT CONTRAST TECHNIQUE: Contiguous axial images were obtained from the base of the skull through the vertex without intravenous contrast. COMPARISON:  None. FINDINGS: Brain: No acute intracranial hemorrhage. No focal mass lesion. No CT evidence of acute infarction. No midline shift or mass effect. No hydrocephalus. Basilar cisterns are patent. Minimal periventricular and subcortical white matter hypodensities. Minimal generalized cortical atrophy. Vascular: No hyperdense vessel or unexpected calcification. Skull: Normal. Negative for fracture or focal lesion. Sinuses/Orbits: Paranasal sinuses and mastoid  air cells are clear. Orbits are clear. Other: None. IMPRESSION: 1. No intracranial trauma. 2. Mild generalized cortical atrophy. Electronically Signed   By: Suzy Bouchard M.D.   On: 03/20/2021 10:21   CT Cervical Spine Wo Contrast  Result Date: 03/22/2021 CLINICAL DATA:  Fall, head injury, struck back of head EXAM: CT HEAD WITHOUT CONTRAST CT CERVICAL SPINE WITHOUT CONTRAST TECHNIQUE: Multidetector CT imaging of the head and cervical spine was performed following the standard protocol without intravenous contrast. Multiplanar CT image reconstructions of the cervical spine were also generated. COMPARISON:  03/20/2021 FINDINGS: CT HEAD FINDINGS Brain: No  evidence of acute infarction, hemorrhage, hydrocephalus, extra-axial collection or mass lesion/mass effect. Vascular: No hyperdense vessel or unexpected calcification. Skull: Normal. Negative for fracture or focal lesion. Sinuses/Orbits: No acute finding. Other: None. CT CERVICAL SPINE FINDINGS Alignment: Normal. Skull base and vertebrae: No acute fracture. No primary bone lesion or focal pathologic process. Soft tissues and spinal canal: No prevertebral fluid or swelling. No visible canal hematoma. Disc levels: Focally moderate disc space height loss and osteophytosis of C5-C6 with otherwise minimal disc space height loss. Upper chest: Negative. Other: None. IMPRESSION: 1. No acute intracranial pathology. 2. No fracture or static subluxation of the cervical spine. 3. Focally moderate disc degenerative disease C5-C6 with otherwise minimal disc space height loss. Electronically Signed   By: Delanna Ahmadi M.D.   On: 03/22/2021 11:06    Subjective: Patient was seen and examined today.  Having some cough.  Denies any pain.  Son at bedside.  Having good family support.  Discharge Exam: Vitals:   03/23/21 0818 03/23/21 0926  BP: (!) 146/83   Pulse: 81   Resp: 18   Temp: 99.6 F (37.6 C)   SpO2: 95% 94%   Vitals:   03/23/21 0125 03/23/21 0512 03/23/21 0818 03/23/21 0926  BP: (!) 141/84 (!) 150/86 (!) 146/83   Pulse: 100 82 81   Resp:  16 18   Temp:  99.4 F (37.4 C) 99.6 F (37.6 C)   TempSrc:  Oral Oral   SpO2: 96% 97% 95% 94%  Weight:      Height:        General: Pt is alert, awake, not in acute distress Cardiovascular: RRR, S1/S2 +, no rubs, no gallops Respiratory: CTA bilaterally, no wheezing, no rhonchi Abdominal: Soft, NT, ND, bowel sounds + Extremities: no edema, no cyanosis   The results of significant diagnostics from this hospitalization (including imaging, microbiology, ancillary and laboratory) are listed below for reference.    Microbiology: Recent Results (from the past  240 hour(s))  Resp Panel by RT-PCR (Flu A&B, Covid) Nasopharyngeal Swab     Status: Abnormal   Collection Time: 03/22/21  4:40 PM   Specimen: Nasopharyngeal Swab; Nasopharyngeal(NP) swabs in vial transport medium  Result Value Ref Range Status   SARS Coronavirus 2 by RT PCR NEGATIVE NEGATIVE Final    Comment: (NOTE) SARS-CoV-2 target nucleic acids are NOT DETECTED.  The SARS-CoV-2 RNA is generally detectable in upper respiratory specimens during the acute phase of infection. The lowest concentration of SARS-CoV-2 viral copies this assay can detect is 138 copies/mL. A negative result does not preclude SARS-Cov-2 infection and should not be used as the sole basis for treatment or other patient management decisions. A negative result may occur with  improper specimen collection/handling, submission of specimen other than nasopharyngeal swab, presence of viral mutation(s) within the areas targeted by this assay, and inadequate number of viral copies(<138 copies/mL). A negative result  must be combined with clinical observations, patient history, and epidemiological information. The expected result is Negative.  Fact Sheet for Patients:  EntrepreneurPulse.com.au  Fact Sheet for Healthcare Providers:  IncredibleEmployment.be  This test is no t yet approved or cleared by the [place] FDA and  has been authorized for detection and/or diagnosis of SARS-CoV-2 by FDA under an Emergency Use Authorization (EUA). This EUA will remain  in effect (meaning this test can be used) for the duration of the COVID-19 declaration under Section 564(b)(1) of the Act, 21 U.S.C.section 360bbb-3(b)(1), unless the authorization is terminated  or revoked sooner.       Influenza A by PCR POSITIVE (A) NEGATIVE Final   Influenza B by PCR NEGATIVE NEGATIVE Final    Comment: (NOTE) The Xpert Xpress SARS-CoV-2/FLU/RSV plus assay is intended as an aid in the diagnosis of  influenza from Nasopharyngeal swab specimens and should not be used as a sole basis for treatment. Nasal washings and aspirates are unacceptable for Xpert Xpress SARS-CoV-2/FLU/RSV testing.  Fact Sheet for Patients: EntrepreneurPulse.com.au  Fact Sheet for Healthcare Providers: IncredibleEmployment.be  This test is not yet approved or cleared by the [place] FDA and has been authorized for detection and/or diagnosis of SARS-CoV-2 by FDA under an Emergency Use Authorization (EUA). This EUA will remain in effect (meaning this test can be used) for the duration of the COVID-19 declaration under Section 564(b)(1) of the Act, 21 U.S.C. section 360bbb-3(b)(1), unless the authorization is terminated or revoked.  Performed at [facility], [address] [place], [place] [address]      Labs: BNP (last 3 results) No results for input(s): BNP in the last 8760 hours. Basic Metabolic Panel: Recent Labs  Lab 03/22/21 0951 03/22/21 1752 03/23/21 0628  NA 131*  --  136  K 3.3*  --  4.0  CL 103  --  106  CO2 23  --  24  GLUCOSE 126*  --  91  BUN 17  --  11  CREATININE 0.69  --  0.65  CALCIUM 8.5*  --  8.1*  MG  --  2.2  --    Liver Function Tests: Recent Labs  Lab 03/22/21 0951  AST 29  ALT 18  ALKPHOS 68  BILITOT 0.6  PROT 6.7  ALBUMIN 3.3*   No results for input(s): LIPASE, AMYLASE in the last 168 hours. No results for input(s): AMMONIA in the last 168 hours. CBC: Recent Labs  Lab 03/22/21 0951  WBC 5.0  NEUTROABS 2.5  HGB 14.3  HCT 42.6  MCV 86.9  PLT 169   Cardiac Enzymes: No results for input(s): CKTOTAL, CKMB, CKMBINDEX, TROPONINI in the last 168 hours. BNP: Invalid input(s): POCBNP CBG: No results for input(s): GLUCAP in the last 168 hours. D-Dimer No results for input(s): DDIMER in the last 72 hours. Hgb A1c No results for input(s): HGBA1C in the last 72 hours. Lipid Profile No results for  input(s): CHOL, HDL, LDLCALC, TRIG, CHOLHDL, LDLDIRECT in the last 72 hours. Thyroid function studies No results for input(s): TSH, T4TOTAL, T3FREE, THYROIDAB in the last 72 hours.  Invalid input(s): FREET3 Anemia work up No results for input(s): VITAMINB12, FOLATE, FERRITIN, TIBC, IRON, RETICCTPCT in the last 72 hours. Urinalysis    Component Value Date/Time   COLORURINE YELLOW 03/22/2021 0947   APPEARANCEUR HAZY (A) 03/22/2021 0947   APPEARANCEUR Clear 01/06/2019 0958   LABSPEC 1.020 03/22/2021 [place] 5.5 03/22/2021 [place] 03/22/2021 0175  HGBUR TRACE (A) 03/22/2021 0947   BILIRUBINUR NEGATIVE 03/22/2021 0947   BILIRUBINUR Negative 01/06/2019 0958   KETONESUR NEGATIVE 03/22/2021 0947   PROTEINUR TRACE (A) 03/22/2021 0947   UROBILINOGEN 0.2 12/07/2018 1020   NITRITE POSITIVE (A) 03/22/2021 0947   LEUKOCYTESUR MODERATE (A) 03/22/2021 0947   Sepsis Labs Invalid input(s): PROCALCITONIN,  WBC,  LACTICIDVEN Microbiology Recent Results (from the past 240 hour(s))  Resp Panel by RT-PCR (Flu A&B, Covid) Nasopharyngeal Swab     Status: Abnormal   Collection Time: 03/22/21  4:40 PM   Specimen: Nasopharyngeal Swab; Nasopharyngeal(NP) swabs in vial transport medium  Result Value Ref Range Status   SARS Coronavirus 2 by RT PCR NEGATIVE NEGATIVE Final    Comment: (NOTE) SARS-CoV-2 target nucleic acids are NOT DETECTED.  The SARS-CoV-2 RNA is generally detectable in upper respiratory specimens during the acute phase of infection. The lowest concentration of SARS-CoV-2 viral copies this assay can detect is 138 copies/mL. A negative result does not preclude SARS-Cov-2 infection and should not be used as the sole basis for treatment or other patient management decisions. A negative result may occur with  improper specimen collection/handling, submission of specimen other than nasopharyngeal swab, presence of viral mutation(s) within the areas targeted by this  assay, and inadequate number of viral copies(<138 copies/mL). A negative result must be combined with clinical observations, patient history, and epidemiological information. The expected result is Negative.  Fact Sheet for Patients:  EntrepreneurPulse.com.au  Fact Sheet for Healthcare Providers:  IncredibleEmployment.be  This test is no t yet approved or cleared by the [place] FDA and  has been authorized for detection and/or diagnosis of SARS-CoV-2 by FDA under an Emergency Use Authorization (EUA). This EUA will remain  in effect (meaning this test can be used) for the duration of the COVID-19 declaration under Section 564(b)(1) of the Act, 21 U.S.C.section 360bbb-3(b)(1), unless the authorization is terminated  or revoked sooner.       Influenza A by PCR POSITIVE (A) NEGATIVE Final   Influenza B by PCR NEGATIVE NEGATIVE Final    Comment: (NOTE) The Xpert Xpress SARS-CoV-2/FLU/RSV plus assay is intended as an aid in the diagnosis of influenza from Nasopharyngeal swab specimens and should not be used as a sole basis for treatment. Nasal washings and aspirates are unacceptable for Xpert Xpress SARS-CoV-2/FLU/RSV testing.  Fact Sheet for Patients: EntrepreneurPulse.com.au  Fact Sheet for Healthcare Providers: IncredibleEmployment.be  This test is not yet approved or cleared by the [place] FDA and has been authorized for detection and/or diagnosis of SARS-CoV-2 by FDA under an Emergency Use Authorization (EUA). This EUA will remain in effect (meaning this test can be used) for the duration of the COVID-19 declaration under Section 564(b)(1) of the Act, 21 U.S.C. section 360bbb-3(b)(1), unless the authorization is terminated or revoked.  Performed at [facility], [address] [place], [place] [address]     Time coordinating discharge: Over 30 minutes  SIGNED:  Lorella Nimrod, MD  [facility] 03/23/2021, 12:55 PM  If 7PM-7AM, please contact night-coverage [email or web address]  This record has been created using Systems analyst. Errors have been sought and corrected,but may not always be located. Such creation errors do not reflect on the standard of care.

## 2021-03-23 NOTE — Care Management CC44 (Signed)
Condition Code 44 Documentation Completed  Patient Details  Name: ZYLIE MUMAW MRN: [id number] Date of Birth: 17-Jan-1941   Condition Code 44 given:  Yes Patient signature on Condition Code 44 notice:  Yes Documentation of 2 MD's agreement:  Yes Code 44 added to claim:  Yes    Conception Oms, RN 03/23/2021, 2:53 PM

## 2021-03-25 ENCOUNTER — Telehealth: Payer: Self-pay

## 2021-03-25 LAB — URINE CULTURE: Culture: >=100000 — AB

## 2021-03-25 NOTE — Telephone Encounter (Signed)
See me about this.  (Work in Wednesday 04/03/21 - 11:30).

## 2021-03-25 NOTE — Telephone Encounter (Signed)
Transition Care Management Follow-up Telephone Call Date of discharge and from where: 03/23/21 [facility] How have you been since you were released from the hospital? Patient states, "I am better today with more strength and good rest. I am drinking more water and have a good appetite. Maintaining my weight." Denies shortness of breath, dizziness, pain, fever. Intermittent productive cough.  Any questions or concerns? No  Items Reviewed: Did the pt receive and understand the discharge instructions provided? Yes  Medications obtained and verified? Yes  Any new allergies since your discharge? No  Dietary orders reviewed? Yes, low sodium/heart healthy.  Do you have support at home? Yes   Home Care and Equipment/Supplies: Were home health services ordered? no  Functional Questionnaire: (I = Independent and D = Dependent) ADLs: I  Bathing/Dressing- I  Meal Prep- I  Eating- I  Maintaining continence- I  Transferring/Ambulation- I  Managing Meds- I  Follow up appointments reviewed:  PCP Hospital f/u appt confirmed? Patient awaiting call back for scheduling. Labs due 1-2 weeks after discharge. First availability on schedule 04/23/21.  Are transportation arrangements needed? No  If their condition worsens, is the pt aware to call PCP or go to the Emergency Dept.? Yes Was the patient provided with contact information for the PCP's office or ED? Yes Was to pt encouraged to call back with questions or concerns? Yes

## 2021-03-26 NOTE — Telephone Encounter (Signed)
Patient has been scheduled

## 2021-04-03 ENCOUNTER — Encounter: Payer: Self-pay | Admitting: Internal Medicine

## 2021-04-03 ENCOUNTER — Other Ambulatory Visit: Payer: Self-pay

## 2021-04-03 ENCOUNTER — Ambulatory Visit (INDEPENDENT_AMBULATORY_CARE_PROVIDER_SITE_OTHER): Payer: Medicare Other | Admitting: Internal Medicine

## 2021-04-03 VITALS — BP 128/76 | HR 86 | Ht 62.01 in | Wt 128.8 lb

## 2021-04-03 DIAGNOSIS — N8111 Cystocele, midline: Secondary | ICD-10-CM

## 2021-04-03 DIAGNOSIS — E7800 Pure hypercholesterolemia, unspecified: Secondary | ICD-10-CM

## 2021-04-03 DIAGNOSIS — R739 Hyperglycemia, unspecified: Secondary | ICD-10-CM | POA: Diagnosis not present

## 2021-04-03 DIAGNOSIS — N3000 Acute cystitis without hematuria: Secondary | ICD-10-CM | POA: Diagnosis not present

## 2021-04-03 DIAGNOSIS — I10 Essential (primary) hypertension: Secondary | ICD-10-CM | POA: Diagnosis not present

## 2021-04-03 DIAGNOSIS — J101 Influenza due to other identified influenza virus with other respiratory manifestations: Secondary | ICD-10-CM | POA: Diagnosis not present

## 2021-04-03 DIAGNOSIS — R55 Syncope and collapse: Secondary | ICD-10-CM | POA: Diagnosis not present

## 2021-04-03 DIAGNOSIS — R059 Cough, unspecified: Secondary | ICD-10-CM

## 2021-04-03 LAB — CBC WITH DIFFERENTIAL/PLATELET
Basophils Absolute: 0.1 10*3/uL (ref 0.0–0.1)
Basophils Relative: 0.7 % (ref 0.0–3.0)
Eosinophils Absolute: 0.2 10*3/uL (ref 0.0–0.7)
Eosinophils Relative: 2.4 % (ref 0.0–5.0)
HCT: 40.9 % (ref 36.0–46.0)
Hemoglobin: 13.4 g/dL (ref 12.0–15.0)
Lymphocytes Relative: 25.1 % (ref 12.0–46.0)
Lymphs Abs: 2 10*3/uL (ref 0.7–4.0)
MCHC: 32.7 g/dL (ref 30.0–36.0)
MCV: 88.4 fl (ref 78.0–100.0)
Monocytes Absolute: 0.8 10*3/uL (ref 0.1–1.0)
Monocytes Relative: 10 % (ref 3.0–12.0)
Neutro Abs: 4.9 10*3/uL (ref 1.4–7.7)
Neutrophils Relative %: 61.8 % (ref 43.0–77.0)
Platelets: 337 10*3/uL (ref 150.0–400.0)
RBC: 4.63 Mil/uL (ref 3.87–5.11)
RDW: 12.7 % (ref 11.5–15.5)
WBC: 8 10*3/uL (ref 4.0–10.5)

## 2021-04-03 LAB — BASIC METABOLIC PANEL
BUN: 21 mg/dL (ref 6–23)
CO2: 27 mEq/L (ref 19–32)
Calcium: 9.5 mg/dL (ref 8.4–10.5)
Chloride: 104 mEq/L (ref 96–112)
Creatinine, Ser: 1.1 mg/dL (ref 0.40–1.20)
GFR: 48.25 mL/min — ABNORMAL LOW (ref >60.00)
Glucose, Bld: 103 mg/dL — ABNORMAL HIGH (ref 70–99)
Potassium: 4.4 mEq/L (ref 3.5–5.1)
Sodium: 139 mEq/L (ref 135–145)

## 2021-04-03 NOTE — Progress Notes (Signed)
[name], female   DOB: 1940-08-15, 80 y.o.   MRN: [id number]   Subjective:    [name], female    DOB: July 03, 1940, 80 y.o.   MRN: [id number]  This visit occurred during the SARS-CoV-2 public health emergency.  Safety protocols were in place, including screening questions prior to the visit, additional usage of staff PPE, and extensive cleaning of exam room while observing appropriate contact time as indicated for disinfecting solutions.   Patient here for hospital follow up.   Chief Complaint  Patient presents with   Hospitalization Follow-up   Cough   .   HPI Admitted 03/22/21 - 03/23/21 after witness syncopal episode.  She reported not feeling well for several days prior to admission.  Increased cough. Tested positive for flu.  Also diagnosed with UTI.  Treated initially with ceftriaxone and discharged home on omnicef.  Also treated with tamiflu.  Orthostatic initially.  Treated with IVFs.  Since her discharge, she feels better.  Denies dizziness, light headedness or headache.  No chest pain or sob.  No chest tightness.  Does report some persistent intermittent cough - drainage with minimal congestion.  Some acid reflux noted.  No abdominal pain.  Bowels moving.  No urinary symptoms.  Daughter present for part of the visit.  History obtained from both of them.  Daughter had questions regarding her hydration, etc.  Needs recheck met b and cbc today.    Past Medical History:  Diagnosis Date   Allergy    Carpal tunnel syndrome 1999   GERD (gastroesophageal reflux disease)    Hypercholesterolemia    By last lipid panel   Hypertension    Borderline. She does not take the 12.5 HCTZ daily   Past Surgical History:  Procedure Laterality Date   APPENDECTOMY  1963   BREAST CYST ASPIRATION Right 07/12/2020   CARPAL TUNNEL RELEASE     right hand   COLONOSCOPY     COLONOSCOPY WITH PROPOFOL N/A 12/01/2016   Procedure: COLONOSCOPY WITH PROPOFOL;  Surgeon: Lucilla Lame,  MD;  Location: [facility];  Service: Gastroenterology;  Laterality: N/A;   EYE SURGERY     left   POLYPECTOMY  12/01/2016   Procedure: POLYPECTOMY INTESTINAL;  Surgeon: Lucilla Lame, MD;  Location: [facility];  Service: Gastroenterology;;  Transverse colon polyp x 2 Ascending colon polyp x 3   TONSILLECTOMY     Family History  Problem Relation Age of Onset   Diabetes Mother    Hypertension Brother    Diabetes Maternal Grandmother    Asthma Daughter    Breast cancer Other    Colon cancer Neg Hx    Social History   Socioeconomic History   Marital status: Married    Spouse name: Not on file   Number of children: 3   Years of education: Not on file   Highest education level: Not on file  Occupational History    Employer: OTHER  Tobacco Use   Smoking status: Never   Smokeless tobacco: Never  Vaping Use   Vaping Use: Never used  Substance and Sexual Activity   Alcohol use: Yes    Alcohol/week: 0.0 standard drinks    Comment: 2-3 drinks/month   Drug use: No   Sexual activity: Never  Other Topics Concern   Not on file  Social History Narrative   Recently widowed. Her husband died of a heart attack in 2015-05-16   Social Determinants of Health  Financial Resource Strain: Not on file  Food Insecurity: Not on file  Transportation Needs: Not on file  Physical Activity: Not on file  Stress: Not on file  Social Connections: Not on file    Review of Systems  Constitutional:  Negative for appetite change and unexpected weight change.  HENT:  Negative for sinus pressure and sore throat.   Respiratory:  Positive for cough. Negative for chest tightness and shortness of breath.   Cardiovascular:  Negative for chest pain, palpitations and leg swelling.  Gastrointestinal:  Negative for abdominal pain, diarrhea, nausea and vomiting.  Genitourinary:  Negative for difficulty urinating and dysuria.  Musculoskeletal:  Negative for joint swelling and myalgias.   Skin:  Negative for color change and rash.  Neurological:  Negative for dizziness, light-headedness and headaches.  Psychiatric/Behavioral:  Negative for agitation and dysphoric mood.       Objective:     BP 128/76    Pulse 86    Ht 5' 2.01" (1.575 m)    Wt 128 lb 12.8 oz (58.4 kg)    LMP 04/28/1992    SpO2 97%    BMI 23.55 kg/m  Wt Readings from Last 3 Encounters:  04/03/21 128 lb 12.8 oz (58.4 kg)  03/22/21 128 lb (58.1 kg)  03/20/21 129 lb (58.5 kg)    Physical Exam Vitals reviewed.  Constitutional:      General: She is not in acute distress.    Appearance: Normal appearance.  HENT:     Head: Normocephalic and atraumatic.     Right Ear: External ear normal.     Left Ear: External ear normal.  Eyes:     General: No scleral icterus.       Right eye: No discharge.        Left eye: No discharge.     Conjunctiva/sclera: Conjunctivae normal.  Neck:     Thyroid: No thyromegaly.  Cardiovascular:     Rate and Rhythm: Normal rate and regular rhythm.  Pulmonary:     Effort: No respiratory distress.     Breath sounds: Normal breath sounds. No wheezing.  Abdominal:     General: Bowel sounds are normal.     Palpations: Abdomen is soft.     Tenderness: There is no abdominal tenderness.  Musculoskeletal:        General: No swelling or tenderness.     Cervical back: Neck supple. No tenderness.  Lymphadenopathy:     Cervical: No cervical adenopathy.  Skin:    Findings: No erythema or rash.  Neurological:     Mental Status: She is alert.  Psychiatric:        Mood and Affect: Mood normal.        Behavior: Behavior normal.     Outpatient Encounter Medications as of 04/03/2021  Medication Sig   benzonatate (TESSALON PERLES) 100 MG capsule Take 1 capsule (100 mg total) by mouth 3 (three) times daily as needed for cough.   Cholecalciferol (VITAMIN D3) 1000 UNITS CAPS Take 1,000 Units by mouth 2 (two) times daily.    co-enzyme Q-10 30 MG capsule Take 30 mg by mouth 2 (two)  times daily.    CYANOCOBALAMIN PO Take 1 tablet by mouth daily.    dextromethorphan-guaiFENesin (MUCINEX DM) 30-600 MG 12hr tablet Take 1 tablet by mouth 2 (two) times daily.   fluticasone (FLONASE) 50 MCG/ACT nasal spray Place 2 sprays into both nostrils daily.   lisinopril (ZESTRIL) 5 MG tablet Take 1 tablet (5 mg total)  by mouth daily.   magnesium 30 MG tablet Take 30 mg by mouth 1 day or 1 dose.   Omega-3 Fatty Acids (FISH OIL) 1000 MG CAPS Take 1,000 mg by mouth 2 (two) times daily.   Red Yeast Rice Extract (RED YEAST RICE PO) Take 2 capsules by mouth daily.   rosuvastatin (CRESTOR) 5 MG tablet Take one tablet 2x/week.   vitamin C (ASCORBIC ACID) 500 MG tablet Take 500 mg by mouth 2 (two) times daily.   No facility-administered encounter medications on file as of 04/03/2021.     Lab Results  Component Value Date   WBC 8.0 04/03/2021   HGB 13.4 04/03/2021   HCT 40.9 04/03/2021   PLT 337.0 04/03/2021   GLUCOSE 103 (H) 04/03/2021   CHOL 186 01/29/2021   TRIG 74.0 01/29/2021   HDL 67.40 01/29/2021   LDLCALC 104 (H) 01/29/2021   ALT 18 03/22/2021   AST 29 03/22/2021   NA 139 04/03/2021   K 4.4 04/03/2021   CL 104 04/03/2021   CREATININE 1.10 04/03/2021   BUN 21 04/03/2021   CO2 27 04/03/2021   TSH 1.15 06/20/2020   HGBA1C 5.8 01/29/2021    DG Chest 1 View  Result Date: 03/22/2021 CLINICAL DATA:  Dizziness, syncope EXAM: CHEST  1 VIEW COMPARISON:  08/20/2015 FINDINGS: Mild cardiomegaly. Both lungs are clear. The visualized skeletal structures are unremarkable. IMPRESSION: Mild cardiomegaly without acute abnormality of the lungs in AP portable projection. Electronically Signed   By: Delanna Ahmadi M.D.   On: 03/22/2021 10:23   CT Head Wo Contrast  Result Date: 03/22/2021 CLINICAL DATA:  Fall, head injury, struck back of head EXAM: CT HEAD WITHOUT CONTRAST CT CERVICAL SPINE WITHOUT CONTRAST TECHNIQUE: Multidetector CT imaging of the head and cervical spine was performed  following the standard protocol without intravenous contrast. Multiplanar CT image reconstructions of the cervical spine were also generated. COMPARISON:  03/20/2021 FINDINGS: CT HEAD FINDINGS Brain: No evidence of acute infarction, hemorrhage, hydrocephalus, extra-axial collection or mass lesion/mass effect. Vascular: No hyperdense vessel or unexpected calcification. Skull: Normal. Negative for fracture or focal lesion. Sinuses/Orbits: No acute finding. Other: None. CT CERVICAL SPINE FINDINGS Alignment: Normal. Skull base and vertebrae: No acute fracture. No primary bone lesion or focal pathologic process. Soft tissues and spinal canal: No prevertebral fluid or swelling. No visible canal hematoma. Disc levels: Focally moderate disc space height loss and osteophytosis of C5-C6 with otherwise minimal disc space height loss. Upper chest: Negative. Other: None. IMPRESSION: 1. No acute intracranial pathology. 2. No fracture or static subluxation of the cervical spine. 3. Focally moderate disc degenerative disease C5-C6 with otherwise minimal disc space height loss. Electronically Signed   By: Delanna Ahmadi M.D.   On: 03/22/2021 11:06   CT Cervical Spine Wo Contrast  Result Date: 03/22/2021 CLINICAL DATA:  Fall, head injury, struck back of head EXAM: CT HEAD WITHOUT CONTRAST CT CERVICAL SPINE WITHOUT CONTRAST TECHNIQUE: Multidetector CT imaging of the head and cervical spine was performed following the standard protocol without intravenous contrast. Multiplanar CT image reconstructions of the cervical spine were also generated. COMPARISON:  03/20/2021 FINDINGS: CT HEAD FINDINGS Brain: No evidence of acute infarction, hemorrhage, hydrocephalus, extra-axial collection or mass lesion/mass effect. Vascular: No hyperdense vessel or unexpected calcification. Skull: Normal. Negative for fracture or focal lesion. Sinuses/Orbits: No acute finding. Other: None. CT CERVICAL SPINE FINDINGS Alignment: Normal. Skull base and  vertebrae: No acute fracture. No primary bone lesion or focal pathologic process. Soft tissues and spinal  canal: No prevertebral fluid or swelling. No visible canal hematoma. Disc levels: Focally moderate disc space height loss and osteophytosis of C5-C6 with otherwise minimal disc space height loss. Upper chest: Negative. Other: None. IMPRESSION: 1. No acute intracranial pathology. 2. No fracture or static subluxation of the cervical spine. 3. Focally moderate disc degenerative disease C5-C6 with otherwise minimal disc space height loss. Electronically Signed   By: Delanna Ahmadi M.D.   On: 03/22/2021 11:06       Assessment & Plan:   Problem List Items Addressed This Visit     Acute cystitis    Diagnosed while in hospital.  Treated with abx.  No symptoms now.  Follow.        Cough    Diagnosed with flu.  Treated with tamiflu.  Doing better.  Some residual cough, but improved.  Robitussin DM and nasacort nasal spray as directed.  Follow.        Cystocele, midline    Has seen gyn and urology.  Has f/u planned with Dr Kenton Kingfisher - pessary.  Last seen 12/2020.       Hypercholesterolemia    The 10-year ASCVD risk score (Arnett DK, et al., 2019) is: 29%   Values used to calculate the score:     Age: 42 years     Sex: Female     Is Non-Hispanic African American: No     Diabetic: No     Tobacco smoker: No     Systolic Blood Pressure: 194 mmHg     Is BP treated: Yes     HDL Cholesterol: 67.4 mg/dL     Total Cholesterol: 186 mg/dL  Continue crestor.       Hyperglycemia    Low carb diet and exercise.  Follow met b and a1c.       Hypertension - Primary    Has previously been on lisinopril.  Not taking now.  Blood pressure ok on today's check.  Rechecked by me 128/78.  Will remain off lisinopril for now.  Follow pressures.  Follow metabolic panel.  Recheck today.        Relevant Orders   CBC with Differential/Platelet (Completed)   Basic metabolic panel (Completed)   Influenza A     Recently admitted as outlined.  Treated with tamiflu and IVFs. Doing better.  Some residual cough.  Robitussin DM and nasacort nasal spray as directed.  Notify me if persistent cough.  Follow.        Syncope    Occurred in setting of her being sick.  Treated for flu and UTI.  Found to be orthostatic on exam.  Treated with IVFs, tamiflu and abx.  Doing better.  No recurring episodes.          [name], MD

## 2021-04-03 NOTE — Assessment & Plan Note (Addendum)
The 10-year ASCVD risk score (Arnett DK, et al., 2019) is: 29%   Values used to calculate the score:     Age: 80 years     Sex: Female     Is Non-Hispanic African American: No     Diabetic: No     Tobacco smoker: No     Systolic Blood Pressure: 233 mmHg     Is BP treated: Yes     HDL Cholesterol: 67.4 mg/dL     Total Cholesterol: 186 mg/dL  Continue crestor.

## 2021-04-03 NOTE — Patient Instructions (Signed)
Robitussin DM twice a day as needed for cough  Nasacort nasal spray - 2 sprays each nostril one time per day.  Do this in the evening.    Hold lisinopril for now.

## 2021-04-04 ENCOUNTER — Other Ambulatory Visit: Payer: Self-pay

## 2021-04-04 ENCOUNTER — Encounter: Payer: Self-pay | Admitting: Internal Medicine

## 2021-04-04 DIAGNOSIS — R944 Abnormal results of kidney function studies: Secondary | ICD-10-CM

## 2021-04-04 NOTE — Assessment & Plan Note (Signed)
Has seen gyn and urology.  Has f/u planned with Dr Harris - pessary.  Last seen 12/2020.  

## 2021-04-04 NOTE — Assessment & Plan Note (Signed)
Diagnosed while in hospital.  Treated with abx.  No symptoms now.  Follow.

## 2021-04-04 NOTE — Assessment & Plan Note (Signed)
Diagnosed with flu.  Treated with tamiflu.  Doing better.  Some residual cough, but improved.  Robitussin DM and nasacort nasal spray as directed.  Follow.

## 2021-04-04 NOTE — Assessment & Plan Note (Signed)
Low carb diet and exercise.  Follow met b and a1c.

## 2021-04-04 NOTE — Assessment & Plan Note (Signed)
Occurred in setting of her being sick.  Treated for flu and UTI.  Found to be orthostatic on exam.  Treated with IVFs, tamiflu and abx.  Doing better.  No recurring episodes.

## 2021-04-04 NOTE — Assessment & Plan Note (Signed)
Has previously been on lisinopril.  Not taking now.  Blood pressure ok on today's check.  Rechecked by me 128/78.  Will remain off lisinopril for now.  Follow pressures.  Follow metabolic panel.  Recheck today.

## 2021-04-04 NOTE — Assessment & Plan Note (Addendum)
Recently admitted as outlined.  Treated with tamiflu and IVFs. Doing better.  Some residual cough.  Robitussin DM and nasacort nasal spray as directed.  Notify me if persistent cough.  Follow.

## 2021-04-15 ENCOUNTER — Other Ambulatory Visit (INDEPENDENT_AMBULATORY_CARE_PROVIDER_SITE_OTHER): Payer: Medicare Other

## 2021-04-15 ENCOUNTER — Other Ambulatory Visit: Payer: Self-pay

## 2021-04-15 DIAGNOSIS — R944 Abnormal results of kidney function studies: Secondary | ICD-10-CM | POA: Diagnosis not present

## 2021-04-16 LAB — BASIC METABOLIC PANEL
BUN: 24 mg/dL — ABNORMAL HIGH (ref 6–23)
CO2: 27 mEq/L (ref 19–32)
Calcium: 9.3 mg/dL (ref 8.4–10.5)
Chloride: 99 mEq/L (ref 96–112)
Creatinine, Ser: 0.84 mg/dL (ref 0.40–1.20)
GFR: 66.67 mL/min (ref >60.00)
Glucose, Bld: 77 mg/dL (ref 70–99)
Potassium: 4.5 mEq/L (ref 3.5–5.1)
Sodium: 132 mEq/L — ABNORMAL LOW (ref 135–145)

## 2021-04-18 ENCOUNTER — Telehealth: Payer: Self-pay

## 2021-04-18 DIAGNOSIS — E871 Hypo-osmolality and hyponatremia: Secondary | ICD-10-CM

## 2021-04-18 NOTE — Telephone Encounter (Signed)
Placed order for sodium for future labs

## 2021-04-23 ENCOUNTER — Other Ambulatory Visit: Payer: Medicare Other

## 2021-05-03 ENCOUNTER — Ambulatory Visit (INDEPENDENT_AMBULATORY_CARE_PROVIDER_SITE_OTHER): Payer: Medicare Other | Admitting: Obstetrics & Gynecology

## 2021-05-03 ENCOUNTER — Encounter: Payer: Self-pay | Admitting: Obstetrics & Gynecology

## 2021-05-03 ENCOUNTER — Other Ambulatory Visit: Payer: Self-pay

## 2021-05-03 VITALS — BP 120/80 | Ht 62.0 in | Wt 129.0 lb

## 2021-05-03 DIAGNOSIS — N8111 Cystocele, midline: Secondary | ICD-10-CM

## 2021-05-03 DIAGNOSIS — N814 Uterovaginal prolapse, unspecified: Secondary | ICD-10-CM | POA: Diagnosis not present

## 2021-05-03 DIAGNOSIS — N816 Rectocele: Secondary | ICD-10-CM

## 2021-05-03 NOTE — Progress Notes (Signed)
HPI:      [name] is a 81 y.o. [phone] who presents today for her pessary follow up and examination related to her pelvic floor weakening.  Pt reports tolerating the pessary well with no vaginal bleeding and no vaginal discharge.  Pt had expulsion of pessary yesterday during straining w difficult BM.  Symptoms of pelvic floor weakening have greatly improved. She is voiding and defecating without difficulty. She currently has a Ring #3 pessary.  PMHx: She  has a past medical history of Allergy, Carpal tunnel syndrome (1999), GERD (gastroesophageal reflux disease), Hypercholesterolemia, and Hypertension. Also,  has a past surgical history that includes Appendectomy (1963); Tonsillectomy; Eye surgery; Carpal tunnel release; Colonoscopy; Colonoscopy with propofol (N/A, 12/01/2016); Polypectomy (12/01/2016); and Breast cyst aspiration (Right, 07/12/2020)., family history includes Asthma in her daughter; Breast cancer in an other family member; Diabetes in her maternal grandmother and mother; Hypertension in her brother.,  reports that she has never smoked. She has never used smokeless tobacco. She reports current alcohol use. She reports that she does not use drugs.  She has a current medication list which includes the following prescription(s): benzonatate, vitamin d3, co-enzyme q-10, cyanocobalamin, dextromethorphan-guaifenesin, fluticasone, lisinopril, magnesium, fish oil, red yeast rice extract, rosuvastatin, and vitamin c. Also, is allergic to codeine sulfate.  Review of Systems  All other systems reviewed and are negative.  Objective: BP 120/80    Ht 5\' 2"  (1.575 m)    Wt 129 lb (58.5 kg)    LMP 04/28/1992    BMI 23.59 kg/m  Physical Exam Constitutional:      General: She is not in acute distress.    Appearance: She is well-developed.  Genitourinary:     Genitourinary Comments: Noted prolapse of uterus, mild cystocele and rectocele     Right Labia: No rash or tenderness.    Left Labia: No  tenderness or rash.    No vaginal erythema or bleeding.      Right Adnexa: not tender and no mass present.    Left Adnexa: not tender and no mass present.    No cervical motion tenderness, discharge, polyp or nabothian cyst.     Uterus is not enlarged.     No uterine mass detected.    Pelvic exam was performed with patient in the lithotomy position.  HENT:     Head: Normocephalic and atraumatic.     Nose: Nose normal.  Abdominal:     General: There is no distension.     Palpations: Abdomen is soft.     Tenderness: There is no abdominal tenderness.  Musculoskeletal:        General: Normal range of motion.  Neurological:     Mental Status: She is alert and oriented to person, place, and time.     Cranial Nerves: No cranial nerve deficit.  Skin:    General: Skin is warm and dry.  Psychiatric:        Attention and Perception: Attention normal.        Mood and Affect: Mood and affect normal.        Speech: Speech normal.        Behavior: Behavior normal.        Thought Content: Thought content normal.        Judgment: Judgment normal.    Pessary Care Pessary cleaned.  Vagina checked - without erosions - pessary replaced.  A/P:   ICD-10-CM   1. Uterine prolapse  N81.4     2.  Cystocele, midline  N81.11     3. Rectocele  N81.6      Pessary was cleaned and replaced today. Instructions given for care. Concerning symptoms to observe for are counseled to patient. Follow up scheduled for 3 months.   A total of 20 minutes were spent face-to-face with the patient as well as preparation, review, communication, and documentation during this encounter.   Barnett Applebaum, MD, [facility], [facility] 05/03/2021  4:38 PM

## 2021-06-19 ENCOUNTER — Other Ambulatory Visit: Payer: Self-pay | Admitting: Internal Medicine

## 2021-06-19 DIAGNOSIS — Z1231 Encounter for screening mammogram for malignant neoplasm of breast: Secondary | ICD-10-CM

## 2021-07-05 ENCOUNTER — Ambulatory Visit (INDEPENDENT_AMBULATORY_CARE_PROVIDER_SITE_OTHER): Payer: Medicare Other | Admitting: Internal Medicine

## 2021-07-05 VITALS — BP 112/72 | HR 79 | Temp 98.0°F | Resp 16 | Ht 62.0 in | Wt 131.0 lb

## 2021-07-05 DIAGNOSIS — F439 Reaction to severe stress, unspecified: Secondary | ICD-10-CM

## 2021-07-05 DIAGNOSIS — R55 Syncope and collapse: Secondary | ICD-10-CM | POA: Diagnosis not present

## 2021-07-05 DIAGNOSIS — R413 Other amnesia: Secondary | ICD-10-CM

## 2021-07-05 DIAGNOSIS — E7800 Pure hypercholesterolemia, unspecified: Secondary | ICD-10-CM

## 2021-07-05 DIAGNOSIS — R739 Hyperglycemia, unspecified: Secondary | ICD-10-CM

## 2021-07-05 DIAGNOSIS — I10 Essential (primary) hypertension: Secondary | ICD-10-CM

## 2021-07-05 LAB — BASIC METABOLIC PANEL
BUN: 19 mg/dL (ref 6–23)
CO2: 28 mEq/L (ref 19–32)
Calcium: 9.5 mg/dL (ref 8.4–10.5)
Chloride: 105 mEq/L (ref 96–112)
Creatinine, Ser: 0.82 mg/dL (ref 0.40–1.20)
GFR: 68.52 mL/min (ref >60.00)
Glucose, Bld: 93 mg/dL (ref 70–99)
Potassium: 4.1 mEq/L (ref 3.5–5.1)
Sodium: 138 mEq/L (ref 135–145)

## 2021-07-05 LAB — HEPATIC FUNCTION PANEL
ALT: 13 U/L (ref 0–35)
AST: 18 U/L (ref 0–37)
Albumin: 4.2 g/dL (ref 3.5–5.2)
Alkaline Phosphatase: 85 U/L (ref 39–117)
Bilirubin, Direct: 0.1 mg/dL (ref 0.0–0.3)
Total Bilirubin: 0.6 mg/dL (ref 0.2–1.2)
Total Protein: 6.5 g/dL (ref 6.0–8.3)

## 2021-07-05 LAB — HEMOGLOBIN A1C: Hgb A1c MFr Bld: 5.9 % (ref 4.6–6.5)

## 2021-07-05 LAB — TSH: TSH: 1.18 u[IU]/mL (ref 0.35–5.50)

## 2021-07-05 LAB — VITAMIN B12: Vitamin B-12: 535 pg/mL (ref 211–911)

## 2021-07-05 NOTE — Progress Notes (Signed)
[name], female   DOB: 03/16/1941, 81 y.o.   MRN: [id number] ? ? ?Subjective:  ? ? [name], female    DOB: 24-Apr-1940, 81 y.o.   MRN: [id number] ? ?This visit occurred during the SARS-CoV-2 public health emergency.  Safety protocols were in place, including screening questions prior to the visit, additional usage of staff PPE, and extensive cleaning of exam room while observing appropriate contact time as indicated for disinfecting solutions.  ? ?Patient here for a scheduled follow up.  ? ?Chief Complaint  ?Patient presents with  ? Hypertension  ? Hyperlipidemia  ? Stress  ? .  ? ?Hypertension ?Pertinent negatives include no chest pain, headaches, palpitations or shortness of breath.  ?Hyperlipidemia ?Pertinent negatives include no chest pain, myalgias or shortness of breath.  ?She reports she is doing relatively well.  Stays active.  No chest pain or sob reported.  Eating.  No nausea or vomiting.  No abdominal pain.  Some constipation.  Discussed miralax. No further syncope or near syncope.  She continues to be off her blood pressure medication.  Also stopped her cholesterol medication.   Does  report some memory issues.  After discussion, it appears to be more of a focus/concentration issue.  Discussed further w/up.   ? ? ?Past Medical History:  ?Diagnosis Date  ? Allergy   ? Carpal tunnel syndrome 1999  ? GERD (gastroesophageal reflux disease)   ? Hypercholesterolemia   ? By last lipid panel  ? Hypertension   ? Borderline. She does not take the 12.5 HCTZ daily  ? ?Past Surgical History:  ?Procedure Laterality Date  ? APPENDECTOMY  1963  ? BREAST CYST ASPIRATION Right 07/12/2020  ? CARPAL TUNNEL RELEASE    ? right hand  ? COLONOSCOPY    ? COLONOSCOPY WITH PROPOFOL N/A 12/01/2016  ? Procedure: COLONOSCOPY WITH PROPOFOL;  Surgeon: Lucilla Lame, MD;  Location: [facility];  Service: Gastroenterology;  Laterality: N/A;  ? EYE SURGERY    ? left  ? POLYPECTOMY  12/01/2016  ? Procedure:  POLYPECTOMY INTESTINAL;  Surgeon: Lucilla Lame, MD;  Location: [facility];  Service: Gastroenterology;;  Transverse colon polyp x 2 ?Ascending colon polyp x 3  ? TONSILLECTOMY    ? ?Family History  ?Problem Relation Age of Onset  ? Diabetes Mother   ? Hypertension Brother   ? Diabetes Maternal Grandmother   ? Asthma Daughter   ? Breast cancer Other   ? Colon cancer Neg Hx   ? ?Social History  ? ?Socioeconomic History  ? Marital status: Married  ?  Spouse name: Not on file  ? Number of children: 3  ? Years of education: Not on file  ? Highest education level: Not on file  ?Occupational History  ?  Employer: OTHER  ?Tobacco Use  ? Smoking status: Never  ? Smokeless tobacco: Never  ?Vaping Use  ? Vaping Use: Never used  ?Substance and Sexual Activity  ? Alcohol use: Yes  ?  Alcohol/week: 0.0 standard drinks  ?  Comment: 2-3 drinks/month  ? Drug use: No  ? Sexual activity: Never  ?Other Topics Concern  ? Not on file  ?Social History Narrative  ? Recently widowed. Her husband died of a heart attack in 04/27/15  ? ?Social Determinants of Health  ? ?Financial Resource Strain: Not on file  ?Food Insecurity: Not on file  ?Transportation Needs: Not on file  ?Physical Activity: Not on file  ?Stress: Not  on file  ?Social Connections: Not on file  ? ? ? ?Review of Systems  ?Constitutional:  Negative for appetite change and unexpected weight change.  ?HENT:  Negative for congestion and sinus pressure.   ?Respiratory:  Negative for cough, chest tightness and shortness of breath.   ?Cardiovascular:  Negative for chest pain, palpitations and leg swelling.  ?Gastrointestinal:  Negative for abdominal pain, diarrhea, nausea and vomiting.  ?Genitourinary:  Negative for difficulty urinating and dysuria.  ?Musculoskeletal:  Negative for joint swelling and myalgias.  ?Skin:  Negative for color change and rash.  ?Neurological:  Negative for dizziness, light-headedness and headaches.  ?Psychiatric/Behavioral:  Negative for  agitation and dysphoric mood.   ? ?   ?Objective:  ?  ? ?BP 112/72   Pulse 79   Temp 98 ?F (36.7 ?C)   Resp 16   Ht '5\' 2"'$  (1.575 m)   Wt 131 lb (59.4 kg)   LMP 04/28/1992   SpO2 98%   BMI 23.96 kg/m?  ?Wt Readings from Last 3 Encounters:  ?07/05/21 131 lb (59.4 kg)  ?05/03/21 129 lb (58.5 kg)  ?04/03/21 128 lb 12.8 oz (58.4 kg)  ? ? ?Physical Exam ?Vitals reviewed.  ?Constitutional:   ?   General: She is not in acute distress. ?   Appearance: Normal appearance.  ?HENT:  ?   Head: Normocephalic and atraumatic.  ?   Right Ear: External ear normal.  ?   Left Ear: External ear normal.  ?Eyes:  ?   General: No scleral icterus.    ?   Right eye: No discharge.     ?   Left eye: No discharge.  ?   Conjunctiva/sclera: Conjunctivae normal.  ?Neck:  ?   Thyroid: No thyromegaly.  ?Cardiovascular:  ?   Rate and Rhythm: Normal rate and regular rhythm.  ?Pulmonary:  ?   Effort: No respiratory distress.  ?   Breath sounds: Normal breath sounds. No wheezing.  ?Abdominal:  ?   General: Bowel sounds are normal.  ?   Palpations: Abdomen is soft.  ?   Tenderness: There is no abdominal tenderness.  ?Musculoskeletal:     ?   General: No swelling or tenderness.  ?   Cervical back: Neck supple. No tenderness.  ?Lymphadenopathy:  ?   Cervical: No cervical adenopathy.  ?Skin: ?   Findings: No erythema or rash.  ?Neurological:  ?   Mental Status: She is alert.  ?Psychiatric:     ?   Mood and Affect: Mood normal.     ?   Behavior: Behavior normal.  ? ? ? ?Outpatient Encounter Medications as of 07/05/2021  ?Medication Sig  ? Cholecalciferol (VITAMIN D3) 1000 UNITS CAPS Take 1,000 Units by mouth 2 (two) times daily.   ? co-enzyme Q-10 30 MG capsule Take 30 mg by mouth 2 (two) times daily.   ? CYANOCOBALAMIN PO Take 1 tablet by mouth daily.   ? fluticasone (FLONASE) 50 MCG/ACT nasal spray Place 2 sprays into both nostrils daily.  ? magnesium 30 MG tablet Take 30 mg by mouth 1 day or 1 dose.  ? Omega-3 Fatty Acids (FISH OIL) 1000 MG CAPS  Take 1,000 mg by mouth 2 (two) times daily.  ? Red Yeast Rice Extract (RED YEAST RICE PO) Take 2 capsules by mouth daily.  ? vitamin C (ASCORBIC ACID) 500 MG tablet Take 500 mg by mouth 2 (two) times daily.  ? [DISCONTINUED] benzonatate (TESSALON PERLES) 100 MG capsule Take 1 capsule (  100 mg total) by mouth 3 (three) times daily as needed for cough.  ? [DISCONTINUED] dextromethorphan-guaiFENesin (MUCINEX DM) 30-600 MG 12hr tablet Take 1 tablet by mouth 2 (two) times daily.  ? [DISCONTINUED] lisinopril (ZESTRIL) 5 MG tablet Take 1 tablet (5 mg total) by mouth daily. (Patient not taking: Reported on 07/05/2021)  ? [DISCONTINUED] rosuvastatin (CRESTOR) 5 MG tablet Take one tablet 2x/week. (Patient not taking: Reported on 07/05/2021)  ? ?No facility-administered encounter medications on file as of 07/05/2021.  ?  ? ?Lab Results  ?Component Value Date  ? WBC 8.0 04/03/2021  ? HGB 13.4 04/03/2021  ? HCT 40.9 04/03/2021  ? PLT 337.0 04/03/2021  ? GLUCOSE 93 07/05/2021  ? CHOL 186 01/29/2021  ? TRIG 74.0 01/29/2021  ? HDL 67.40 01/29/2021  ? [place] 104 (H) 01/29/2021  ? ALT 13 07/05/2021  ? AST 18 07/05/2021  ? NA 138 07/05/2021  ? K 4.1 07/05/2021  ? CL 105 07/05/2021  ? CREATININE 0.82 07/05/2021  ? BUN 19 07/05/2021  ? CO2 28 07/05/2021  ? TSH 1.18 07/05/2021  ? HGBA1C 5.9 07/05/2021  ? ? ?DG Chest 1 View ? ?Result Date: 03/22/2021 ?CLINICAL DATA:  Dizziness, syncope EXAM: CHEST  1 VIEW COMPARISON:  08/20/2015 FINDINGS: Mild cardiomegaly. Both lungs are clear. The visualized skeletal structures are unremarkable. IMPRESSION: Mild cardiomegaly without acute abnormality of the lungs in AP portable projection. Electronically Signed   By: Delanna Ahmadi M.D.   On: 03/22/2021 10:23  ? ?CT Head Wo Contrast ? ?Result Date: 03/22/2021 ?CLINICAL DATA:  Fall, head injury, struck back of head EXAM: CT HEAD WITHOUT CONTRAST CT CERVICAL SPINE WITHOUT CONTRAST TECHNIQUE: Multidetector CT imaging of the head and cervical spine was  performed following the standard protocol without intravenous contrast. Multiplanar CT image reconstructions of the cervical spine were also generated. COMPARISON:  03/20/2021 FINDINGS: CT HEAD FINDINGS Brain: No evidence of acu

## 2021-07-06 ENCOUNTER — Encounter: Payer: Self-pay | Admitting: Internal Medicine

## 2021-07-06 NOTE — Assessment & Plan Note (Signed)
Noticed some memory change as outlined.  Discussed.  May be more of a focus/concentration issue.  Check routine labs including B12.  Follow.  ?

## 2021-07-06 NOTE — Assessment & Plan Note (Signed)
The 10-year ASCVD risk score (Arnett DK, et al., 2019) is: 17.7% ?  Values used to calculate the score: ?    Age: 81 years ?    Sex: Female ?    Is Non-Hispanic African American: No ?    Diabetic: No ?    Tobacco smoker: No ?    Systolic Blood Pressure: 038 mmHg ?    Is BP treated: No ?    HDL Cholesterol: 67.4 mg/dL ?    Total Cholesterol: 186 mg/dL  ?She is off cholesterol medication.  Low cholesterol diet and exercise.  Will check lipid panel.  Discussed possible need to restart medication.  ?

## 2021-07-06 NOTE — Assessment & Plan Note (Signed)
Off lisinopril.  Blood pressure as outlined.  Recheck slightly elevated.  Have her spot check her pressures. Send in readings.  Hold on adding back medication.  Follow.  ?

## 2021-07-06 NOTE — Assessment & Plan Note (Signed)
Handling stress.  Does not feel needs any further intervention.  Follow.   

## 2021-07-06 NOTE — Assessment & Plan Note (Signed)
Occurred in setting of her being sick.  Treated for flu and UTI.  Found to be orthostatic on exam.  Treated with IVFs, tamiflu and abx.  Doing better.  No recurring episodes.  Off blood pressure medication.  Blood pressure on recheck slightly increased.  She will spot check her pressures.  Send in readings over the next few weeks.   ?

## 2021-07-06 NOTE — Assessment & Plan Note (Signed)
Low carb diet and exercise.  Follow met b and a1c.  ?

## 2021-07-08 ENCOUNTER — Encounter: Payer: Self-pay | Admitting: *Deleted

## 2021-07-26 ENCOUNTER — Ambulatory Visit
Admission: RE | Admit: 2021-07-26 | Discharge: 2021-07-26 | Disposition: A | Discharge status: Home / Self Care | Payer: Medicare Other | Source: Ambulatory Visit | Attending: Internal Medicine | Admitting: Internal Medicine

## 2021-07-26 DIAGNOSIS — Z1231 Encounter for screening mammogram for malignant neoplasm of breast: Secondary | ICD-10-CM | POA: Diagnosis not present

## 2021-08-30 ENCOUNTER — Ambulatory Visit: Payer: Medicare Other | Admitting: Internal Medicine

## 2021-09-25 DIAGNOSIS — D2261 Melanocytic nevi of right upper limb, including shoulder: Secondary | ICD-10-CM | POA: Diagnosis not present

## 2021-09-25 DIAGNOSIS — Z85828 Personal history of other malignant neoplasm of skin: Secondary | ICD-10-CM | POA: Diagnosis not present

## 2021-09-25 DIAGNOSIS — D2262 Melanocytic nevi of left upper limb, including shoulder: Secondary | ICD-10-CM | POA: Diagnosis not present

## 2021-09-25 DIAGNOSIS — D2271 Melanocytic nevi of right lower limb, including hip: Secondary | ICD-10-CM | POA: Diagnosis not present

## 2021-10-02 ENCOUNTER — Ambulatory Visit (INDEPENDENT_AMBULATORY_CARE_PROVIDER_SITE_OTHER): Payer: Medicare Other | Admitting: Internal Medicine

## 2021-10-02 ENCOUNTER — Encounter: Payer: Self-pay | Admitting: Internal Medicine

## 2021-10-02 DIAGNOSIS — I10 Essential (primary) hypertension: Secondary | ICD-10-CM | POA: Diagnosis not present

## 2021-10-02 DIAGNOSIS — F439 Reaction to severe stress, unspecified: Secondary | ICD-10-CM | POA: Diagnosis not present

## 2021-10-02 DIAGNOSIS — F419 Anxiety disorder, unspecified: Secondary | ICD-10-CM | POA: Diagnosis not present

## 2021-10-02 DIAGNOSIS — E7800 Pure hypercholesterolemia, unspecified: Secondary | ICD-10-CM | POA: Diagnosis not present

## 2021-10-02 DIAGNOSIS — H34219 Partial retinal artery occlusion, unspecified eye: Secondary | ICD-10-CM

## 2021-10-02 DIAGNOSIS — R739 Hyperglycemia, unspecified: Secondary | ICD-10-CM

## 2021-10-02 MED ORDER — AMLODIPINE BESYLATE 2.5 MG PO TABS: 2.5000 mg | ORAL_TABLET | Freq: Every day | ORAL | 2 refills | Status: DC

## 2021-10-02 NOTE — Progress Notes (Signed)
[name], female   DOB: 1940/05/03, 81 y.o.   MRN: [id number]   Subjective:    [name], female    DOB: [birth year], 81 y.o.   MRN: [id number]    Patient here for a scheduled follow up.   Chief Complaint  Patient presents with   Follow-up    Follow up HTN and HLD   .   HPI Increased stress.  Discussed.  She does not feel needs any further intervention.  `stays active.  No chest pain or sob reported.  No abdominal pain.  Bowels moving.  Blood pressure elevated.     Past Medical History:  Diagnosis Date   Allergy    Carpal tunnel syndrome 1999   GERD (gastroesophageal reflux disease)    Hypercholesterolemia    By last lipid panel   Hypertension    Borderline. She does not take the 12.5 HCTZ daily   Past Surgical History:  Procedure Laterality Date   APPENDECTOMY  1963   BREAST CYST ASPIRATION Right 07/12/2020   CARPAL TUNNEL RELEASE     right hand   COLONOSCOPY     COLONOSCOPY WITH PROPOFOL N/A 12/01/2016   Procedure: COLONOSCOPY WITH PROPOFOL;  Surgeon: Lucilla Lame, MD;  Location: [facility];  Service: Gastroenterology;  Laterality: N/A;   EYE SURGERY     left   POLYPECTOMY  12/01/2016   Procedure: POLYPECTOMY INTESTINAL;  Surgeon: Lucilla Lame, MD;  Location: [facility];  Service: Gastroenterology;;  Transverse colon polyp x 2 Ascending colon polyp x 3   TONSILLECTOMY     Family History  Problem Relation Age of Onset   Diabetes Mother    Hypertension Brother    Diabetes Maternal Grandmother    Asthma Daughter    Breast cancer Other    Colon cancer Neg Hx    Social History   Socioeconomic History   Marital status: Married    Spouse name: Not on file   Number of children: 3   Years of education: Not on file   Highest education level: Not on file  Occupational History    Employer: OTHER  Tobacco Use   Smoking status: Never   Smokeless tobacco: Never  Vaping Use   Vaping Use: Never used  Substance and Sexual  Activity   Alcohol use: Yes    Alcohol/week: 0.0 standard drinks of alcohol    Comment: 2-3 drinks/month   Drug use: No   Sexual activity: Never  Other Topics Concern   Not on file  Social History Narrative   Recently widowed. Her husband died of a heart attack in 2015-05-15   Social Determinants of Health   Financial Resource Strain: Low Risk  (11/10/2017)   Overall Financial Resource Strain (CARDIA)    Difficulty of Paying Living Expenses: Not hard at all  Food Insecurity: No Food Insecurity (11/10/2017)   Hunger Vital Sign    Worried About Running Out of Food in the Last Year: Never true    Ran Out of Food in the Last Year: Never true  Transportation Needs: No Transportation Needs (11/10/2017)   PRAPARE - Hydrologist (Medical): No    Lack of Transportation (Non-Medical): No  Physical Activity: Sufficiently Active (11/10/2017)   Exercise Vital Sign    Days of Exercise per Week: 4 days    Minutes of Exercise per Session: 90 min  Stress: No Stress Concern Present (11/10/2017)   [employer] - Occupational Stress Questionnaire    Feeling of Stress : Not at all  Social Connections: Not on file     Review of Systems     Objective:     BP (!) 140/92 (BP Location: Left Arm, Patient Position: Sitting, Cuff Size: Normal)   Pulse 98   Resp 20   Ht _0  (1.575 m)   Wt 131 lb (59.4 kg)   LMP 04/28/1992   SpO2 98%   BMI 23.96 kg/m  Wt Readings from Last 3 Encounters:  10/02/21 131 lb (59.4 kg)  07/05/21 131 lb (59.4 kg)  05/03/21 129 lb (58.5 kg)    Physical Exam   Outpatient Encounter Medications as of 10/02/2021  Medication Sig   amLODipine (NORVASC) 2.5 MG tablet Take 1 tablet (2.5 mg total) by mouth daily.   Cholecalciferol (VITAMIN D3) 1000 UNITS CAPS Take 1,000 Units by mouth 2 (two) times daily.    co-enzyme Q-10 30 MG capsule Take 30 mg by mouth 2 (two) times daily.    CYANOCOBALAMIN PO Take 1 tablet by  mouth daily.    fluticasone (FLONASE) 50 MCG/ACT nasal spray Place 2 sprays into both nostrils daily.   magnesium 30 MG tablet Take 30 mg by mouth 1 day or 1 dose.   [DISCONTINUED] Omega-3 Fatty Acids (FISH OIL) 1000 MG CAPS Take 1,000 mg by mouth 2 (two) times daily. (Patient not taking: Reported on 10/02/2021)   [DISCONTINUED] Red Yeast Rice Extract (RED YEAST RICE PO) Take 2 capsules by mouth daily. (Patient not taking: Reported on 10/02/2021)   [DISCONTINUED] vitamin C (ASCORBIC ACID) 500 MG tablet Take 500 mg by mouth 2 (two) times daily. (Patient not taking: Reported on 10/02/2021)   No facility-administered encounter medications on file as of 10/02/2021.     Lab Results  Component Value Date   WBC 8.0 04/03/2021   HGB 13.4 04/03/2021   HCT 40.9 04/03/2021   PLT 337.0 04/03/2021   GLUCOSE 93 07/05/2021   CHOL 186 01/29/2021   TRIG 74.0 01/29/2021   HDL 67.40 01/29/2021   LDLCALC 104 (H) 01/29/2021   ALT 13 07/05/2021   AST 18 07/05/2021   NA 138 07/05/2021   K 4.1 07/05/2021   CL 105 07/05/2021   CREATININE 0.82 07/05/2021   BUN 19 07/05/2021   CO2 28 07/05/2021   TSH 1.18 07/05/2021   HGBA1C 5.9 07/05/2021    MM 3D SCREEN BREAST BILATERAL  Result Date: 07/29/2021 CLINICAL DATA:  Screening. EXAM: DIGITAL SCREENING BILATERAL MAMMOGRAM WITH TOMOSYNTHESIS AND CAD TECHNIQUE: Bilateral screening digital craniocaudal and mediolateral oblique mammograms were obtained. Bilateral screening digital breast tomosynthesis was performed. The images were evaluated with computer-aided detection. COMPARISON:  Previous exam(s). ACR Breast Density Category b: There are scattered areas of fibroglandular density. FINDINGS: There are no findings suspicious for malignancy. IMPRESSION: No mammographic evidence of malignancy. A result letter of this screening mammogram will be mailed directly to the patient. RECOMMENDATION: Screening mammogram in one year. (Code:SM-B-01Y) BI-RADS CATEGORY  1: Negative.  Electronically Signed   By: Ammie Ferrier M.D.   On: 07/29/2021 08:08       Assessment & Plan:   Problem List Items Addressed This Visit     Anxiety    Increased stress as outlined.  Discussed.  Desires no further intervention.  Follow.       Hollenhorst plaque    Continue crestor.       Relevant Medications   amLODipine (NORVASC) 2.5 MG tablet  Hypercholesterolemia    The 10-year ASCVD risk score (Arnett DK, et al., 2019) is: 33.7%   Values used to calculate the score:     Age: 78 years     Sex: Female     Is Non-Hispanic African American: No     Diabetic: No     Tobacco smoker: No     Systolic Blood Pressure: 256 mmHg     Is BP treated: Yes     HDL Cholesterol: 67.4 mg/dL     Total Cholesterol: 186 mg/dL  She is off cholesterol medication.  Has declined. Low cholesterol diet and exercise.  Will check lipid panel.  D      Relevant Medications   amLODipine (NORVASC) 2.5 MG tablet   Other Relevant Orders   Lipid panel   Hepatic function panel   Hyperglycemia    Low carb diet and exercise.  Follow met b and a1c.       Relevant Orders   Hemoglobin A1c   Hypertension    Off lisinopril.  Blood pressure as outlined.  Recheck slightly elevated.  Start amlodipine 2.59m q day. Have her spot check her pressures. Send in readings.   Follow.       Relevant Medications   amLODipine (NORVASC) 2.5 MG tablet   Other Relevant Orders   Basic metabolic panel   Stress    Increased stress.  Discussed.  Desires no further intervention.  Follow.         CEinar Pheasant MD

## 2021-10-08 ENCOUNTER — Encounter: Payer: Self-pay | Admitting: Internal Medicine

## 2021-10-08 NOTE — Assessment & Plan Note (Signed)
Increased stress.  Discussed.  Desires no further intervention.  Follow.

## 2021-10-08 NOTE — Assessment & Plan Note (Signed)
Continue crestor 

## 2021-10-08 NOTE — Assessment & Plan Note (Signed)
Low carb diet and exercise.  Follow met b and a1c.  

## 2021-10-08 NOTE — Assessment & Plan Note (Signed)
Off lisinopril.  Blood pressure as outlined.  Recheck slightly elevated.  Start amlodipine 2.'5mg'$  q day. Have her spot check her pressures. Send in readings.   Follow.

## 2021-10-08 NOTE — Assessment & Plan Note (Signed)
Increased stress as outlined.  Discussed.  Desires no further intervention.  Follow.

## 2021-10-08 NOTE — Assessment & Plan Note (Signed)
The 10-year ASCVD risk score (Arnett DK, et al., 2019) is: 33.7%   Values used to calculate the score:     Age: 81 years     Sex: Female     Is Non-Hispanic African American: No     Diabetic: No     Tobacco smoker: No     Systolic Blood Pressure: 664 mmHg     Is BP treated: Yes     HDL Cholesterol: 67.4 mg/dL     Total Cholesterol: 186 mg/dL  She is off cholesterol medication.  Has declined. Low cholesterol diet and exercise.  Will check lipid panel.  D

## 2021-11-15 ENCOUNTER — Other Ambulatory Visit (INDEPENDENT_AMBULATORY_CARE_PROVIDER_SITE_OTHER): Payer: Medicare Other

## 2021-11-15 DIAGNOSIS — R739 Hyperglycemia, unspecified: Secondary | ICD-10-CM

## 2021-11-15 DIAGNOSIS — E7800 Pure hypercholesterolemia, unspecified: Secondary | ICD-10-CM | POA: Diagnosis not present

## 2021-11-15 DIAGNOSIS — E871 Hypo-osmolality and hyponatremia: Secondary | ICD-10-CM

## 2021-11-15 DIAGNOSIS — I10 Essential (primary) hypertension: Secondary | ICD-10-CM | POA: Diagnosis not present

## 2021-11-15 LAB — LIPID PANEL
Cholesterol: 211 mg/dL — ABNORMAL HIGH (ref 0–200)
HDL: 62.1 mg/dL (ref >39.00)
LDL Cholesterol: 130 mg/dL — ABNORMAL HIGH (ref 0–99)
NonHDL: 148.85
Total CHOL/HDL Ratio: 3
Triglycerides: 95 mg/dL (ref 0.0–149.0)
VLDL: 19 mg/dL (ref 0.0–40.0)

## 2021-11-15 LAB — HEPATIC FUNCTION PANEL
ALT: 12 U/L (ref 0–35)
AST: 19 U/L (ref 0–37)
Albumin: 4.2 g/dL (ref 3.5–5.2)
Alkaline Phosphatase: 86 U/L (ref 39–117)
Bilirubin, Direct: 0.1 mg/dL (ref 0.0–0.3)
Total Bilirubin: 0.7 mg/dL (ref 0.2–1.2)
Total Protein: 6.8 g/dL (ref 6.0–8.3)

## 2021-11-15 LAB — BASIC METABOLIC PANEL
BUN: 16 mg/dL (ref 6–23)
CO2: 27 mEq/L (ref 19–32)
Calcium: 9.6 mg/dL (ref 8.4–10.5)
Chloride: 103 mEq/L (ref 96–112)
Creatinine, Ser: 0.82 mg/dL (ref 0.40–1.20)
GFR: 68.35 mL/min (ref >60.00)
Glucose, Bld: 87 mg/dL (ref 70–99)
Potassium: 3.8 mEq/L (ref 3.5–5.1)
Sodium: 139 mEq/L (ref 135–145)

## 2021-11-15 LAB — HEMOGLOBIN A1C: Hgb A1c MFr Bld: 6.2 % (ref 4.6–6.5)

## 2021-11-18 ENCOUNTER — Ambulatory Visit (INDEPENDENT_AMBULATORY_CARE_PROVIDER_SITE_OTHER): Payer: Medicare Other | Admitting: Internal Medicine

## 2021-11-18 ENCOUNTER — Encounter: Payer: Self-pay | Admitting: Internal Medicine

## 2021-11-18 DIAGNOSIS — F439 Reaction to severe stress, unspecified: Secondary | ICD-10-CM

## 2021-11-18 DIAGNOSIS — N8111 Cystocele, midline: Secondary | ICD-10-CM

## 2021-11-18 DIAGNOSIS — H34219 Partial retinal artery occlusion, unspecified eye: Secondary | ICD-10-CM

## 2021-11-18 DIAGNOSIS — E7800 Pure hypercholesterolemia, unspecified: Secondary | ICD-10-CM | POA: Diagnosis not present

## 2021-11-18 DIAGNOSIS — F419 Anxiety disorder, unspecified: Secondary | ICD-10-CM | POA: Diagnosis not present

## 2021-11-18 DIAGNOSIS — R739 Hyperglycemia, unspecified: Secondary | ICD-10-CM | POA: Diagnosis not present

## 2021-11-18 DIAGNOSIS — I10 Essential (primary) hypertension: Secondary | ICD-10-CM

## 2021-11-18 MED ORDER — ROSUVASTATIN CALCIUM 5 MG PO TABS: ORAL_TABLET | ORAL | 1 refills | Status: DC

## 2021-11-18 MED ORDER — AMLODIPINE BESYLATE 2.5 MG PO TABS: 2.5000 mg | ORAL_TABLET | Freq: Every day | ORAL | 2 refills | Status: DC

## 2021-11-18 NOTE — Assessment & Plan Note (Addendum)
The 10-year ASCVD risk score (Arnett DK, et al., 2019) is: 32.5%   Values used to calculate the score:     Age: 81 years     Sex: Female     Is Non-Hispanic African American: No     Diabetic: No     Tobacco smoker: No     Systolic Blood Pressure: 007 mmHg     Is BP treated: Yes     HDL Cholesterol: 62.1 mg/dL     Total Cholesterol: 211 mg/dL  Discussed calculated cholesterol risk.  Agreed to start low dose crestor a few days per week.  Follow lipid panel and liver function tests.  Low cholesterol diet and exercise.

## 2021-11-18 NOTE — Progress Notes (Signed)
[name], female   DOB: 1940-10-22, 81 y.o.   MRN: [id number]   Subjective:    [name], female    DOB: 12-12-1940, 81 y.o.   MRN: [id number]   Patient here for a scheduled follow up .   HPI Here to follow up regarding her blood pressure, cholesterol and increased stress.  She reports she is doing relatively well.  Still with increased stress.  Son helps her. Discussed. Does not feel she needs any further intervention.  No chest pain.  Breathing stable.  No cough or congestion.  No acid reflux.  No abdominal pain. Discussed benefiber for bowels.  Agreeable to get tetanus at pharmacy.  Agreeable for colonoscopy.     Past Medical History:  Diagnosis Date   Allergy    Carpal tunnel syndrome 1999   GERD (gastroesophageal reflux disease)    Hypercholesterolemia    By last lipid panel   Hypertension    Borderline. She does not take the 12.5 HCTZ daily   Past Surgical History:  Procedure Laterality Date   APPENDECTOMY  1963   BREAST CYST ASPIRATION Right 07/12/2020   CARPAL TUNNEL RELEASE     right hand   COLONOSCOPY     COLONOSCOPY WITH PROPOFOL N/A 12/01/2016   Procedure: COLONOSCOPY WITH PROPOFOL;  Surgeon: Lucilla Lame, MD;  Location: [facility];  Service: Gastroenterology;  Laterality: N/A;   EYE SURGERY     left   POLYPECTOMY  12/01/2016   Procedure: POLYPECTOMY INTESTINAL;  Surgeon: Lucilla Lame, MD;  Location: [facility];  Service: Gastroenterology;;  Transverse colon polyp x 2 Ascending colon polyp x 3   TONSILLECTOMY     Family History  Problem Relation Age of Onset   Diabetes Mother    Hypertension Brother    Diabetes Maternal Grandmother    Asthma Daughter    Breast cancer Other    Colon cancer Neg Hx    Social History   Socioeconomic History   Marital status: Married    Spouse name: Not on file   Number of children: 3   Years of education: Not on file   Highest education level: Not on file  Occupational History     Employer: OTHER  Tobacco Use   Smoking status: Never   Smokeless tobacco: Never  Vaping Use   Vaping Use: Never used  Substance and Sexual Activity   Alcohol use: Yes    Alcohol/week: 0.0 standard drinks of alcohol    Comment: 2-3 drinks/month   Drug use: No   Sexual activity: Never  Other Topics Concern   Not on file  Social History Narrative   Recently widowed. Her husband died of a heart attack in 2015-05-23   Social Determinants of Health   Financial Resource Strain: Low Risk  (11/10/2017)   Overall Financial Resource Strain (CARDIA)    Difficulty of Paying Living Expenses: Not hard at all  Food Insecurity: No Food Insecurity (11/10/2017)   Hunger Vital Sign    Worried About Running Out of Food in the Last Year: Never true    Ran Out of Food in the Last Year: Never true  Transportation Needs: No Transportation Needs (11/10/2017)   PRAPARE - Hydrologist (Medical): No    Lack of Transportation (Non-Medical): No  Physical Activity: Sufficiently Active (11/10/2017)   Exercise Vital Sign    Days of Exercise per Week: 4 days    Minutes of Exercise  per Session: 90 min  Stress: No Stress Concern Present (11/10/2017)   [place]    Feeling of Stress : Not at all  Social Connections: Not on file     Review of Systems  Constitutional:  Negative for appetite change and unexpected weight change.  HENT:  Negative for congestion and sinus pressure.   Respiratory:  Negative for cough, chest tightness and shortness of breath.   Cardiovascular:  Negative for chest pain, palpitations and leg swelling.  Gastrointestinal:  Negative for abdominal pain, diarrhea, nausea and vomiting.  Genitourinary:  Negative for difficulty urinating and dysuria.  Musculoskeletal:  Negative for joint swelling and myalgias.  Skin:  Negative for color change and rash.  Neurological:  Negative for dizziness,  light-headedness and headaches.  Psychiatric/Behavioral:  Negative for agitation and dysphoric mood.        Increased stress as outlined.        Objective:     BP 138/70 (BP Location: Left Arm, Patient Position: Sitting, Cuff Size: Small)   Pulse 76   Temp 98.2 F (36.8 C) (Temporal)   Resp 17   Ht 5' 2"  (1.575 m)   Wt 132 lb 3.2 oz (60 kg)   LMP 04/28/1992   SpO2 99%   BMI 24.18 kg/m  Wt Readings from Last 3 Encounters:  11/18/21 132 lb 3.2 oz (60 kg)  10/02/21 131 lb (59.4 kg)  07/05/21 131 lb (59.4 kg)    Physical Exam Vitals reviewed.  Constitutional:      General: She is not in acute distress.    Appearance: Normal appearance.  HENT:     Head: Normocephalic and atraumatic.     Right Ear: External ear normal.     Left Ear: External ear normal.  Eyes:     General: No scleral icterus.       Right eye: No discharge.        Left eye: No discharge.     Conjunctiva/sclera: Conjunctivae normal.  Neck:     Thyroid: No thyromegaly.  Cardiovascular:     Rate and Rhythm: Normal rate and regular rhythm.  Pulmonary:     Effort: No respiratory distress.     Breath sounds: Normal breath sounds. No wheezing.  Abdominal:     General: Bowel sounds are normal.     Palpations: Abdomen is soft.     Tenderness: There is no abdominal tenderness.  Musculoskeletal:        General: No swelling or tenderness.     Cervical back: Neck supple. No tenderness.  Lymphadenopathy:     Cervical: No cervical adenopathy.  Skin:    Findings: No erythema or rash.  Neurological:     Mental Status: She is alert.  Psychiatric:        Mood and Affect: Mood normal.        Behavior: Behavior normal.      Outpatient Encounter Medications as of 11/18/2021  Medication Sig   Cholecalciferol (VITAMIN D3) 1000 UNITS CAPS Take 1,000 Units by mouth 2 (two) times daily.    co-enzyme Q-10 30 MG capsule Take 30 mg by mouth 2 (two) times daily.    CYANOCOBALAMIN PO Take 1 tablet by mouth daily.     diphenhydrAMINE HCl, Sleep, (ZZZQUIL PO) Take by mouth. 3 GUMMIES PER NIGHT   fluticasone (FLONASE) 50 MCG/ACT nasal spray Place 2 sprays into both nostrils daily.   magnesium 30 MG tablet Take 30 mg by mouth  1 day or 1 dose.   Nutritional Supplements (JOINT FORMULA PO) Take by mouth.   rosuvastatin (CRESTOR) 5 MG tablet Take one tablet q Monday, Wednesday and Friday   [DISCONTINUED] amLODipine (NORVASC) 2.5 MG tablet Take 1 tablet (2.5 mg total) by mouth daily.   [DISCONTINUED] Doxylamine Succinate, Sleep, (SLEEP-AID PO) Take by mouth. Zquil gummy - 3 gummies per night   amLODipine (NORVASC) 2.5 MG tablet Take 1 tablet (2.5 mg total) by mouth daily.   No facility-administered encounter medications on file as of 11/18/2021.     Lab Results  Component Value Date   WBC 8.0 04/03/2021   HGB 13.4 04/03/2021   HCT 40.9 04/03/2021   PLT 337.0 04/03/2021   GLUCOSE 87 11/15/2021   CHOL 211 (H) 11/15/2021   TRIG 95.0 11/15/2021   HDL 62.10 11/15/2021   LDLCALC 130 (H) 11/15/2021   ALT 12 11/15/2021   AST 19 11/15/2021   NA 139 11/15/2021   K 3.8 11/15/2021   CL 103 11/15/2021   CREATININE 0.82 11/15/2021   BUN 16 11/15/2021   CO2 27 11/15/2021   TSH 1.18 07/05/2021   HGBA1C 6.2 11/15/2021    MM 3D SCREEN BREAST BILATERAL  Result Date: 07/29/2021 CLINICAL DATA:  Screening. EXAM: DIGITAL SCREENING BILATERAL MAMMOGRAM WITH TOMOSYNTHESIS AND CAD TECHNIQUE: Bilateral screening digital craniocaudal and mediolateral oblique mammograms were obtained. Bilateral screening digital breast tomosynthesis was performed. The images were evaluated with computer-aided detection. COMPARISON:  Previous exam(s). ACR Breast Density Category b: There are scattered areas of fibroglandular density. FINDINGS: There are no findings suspicious for malignancy. IMPRESSION: No mammographic evidence of malignancy. A result letter of this screening mammogram will be mailed directly to the patient. RECOMMENDATION:  Screening mammogram in one year. (Code:SM-B-01Y) BI-RADS CATEGORY  1: Negative. Electronically Signed   By: Ammie Ferrier M.D.   On: 07/29/2021 08:08       Assessment & Plan:   Problem List Items Addressed This Visit     Anxiety    Increased stress as outlined.  Discussed.  Desires no further intervention.  Follow.       Cystocele, midline    Has seen gyn and urology.  Has f/u planned with Dr Kenton Kingfisher - pessary.  Last seen 12/2020.       Hollenhorst plaque    Continue crestor.       Relevant Medications   amLODipine (NORVASC) 2.5 MG tablet   rosuvastatin (CRESTOR) 5 MG tablet   Hypercholesterolemia    The 10-year ASCVD risk score (Arnett DK, et al., 2019) is: 32.5%   Values used to calculate the score:     Age: 85 years     Sex: Female     Is Non-Hispanic African American: No     Diabetic: No     Tobacco smoker: No     Systolic Blood Pressure: 280 mmHg     Is BP treated: Yes     HDL Cholesterol: 62.1 mg/dL     Total Cholesterol: 211 mg/dL  Discussed calculated cholesterol risk.  Agreed to start low dose crestor a few days per week.  Follow lipid panel and liver function tests.  Low cholesterol diet and exercise.       Relevant Medications   amLODipine (NORVASC) 2.5 MG tablet   rosuvastatin (CRESTOR) 5 MG tablet   Other Relevant Orders   Hepatic function panel   Hyperglycemia    Low carb diet and exercise.  Follow met b and a1c.  Hypertension    On amlodipine.  Blood pressure doing better.  Continue current medication.  Follow pressures.  Follow metabolic panel.       Relevant Medications   amLODipine (NORVASC) 2.5 MG tablet   rosuvastatin (CRESTOR) 5 MG tablet   Stress    Handling stress.  Does not feel needs any further intervention.  Follow.         [name], MD

## 2021-11-25 ENCOUNTER — Encounter: Payer: Self-pay | Admitting: Internal Medicine

## 2021-11-25 NOTE — Assessment & Plan Note (Signed)
Continue crestor 

## 2021-11-25 NOTE — Assessment & Plan Note (Signed)
On amlodipine.  Blood pressure doing better.  Continue current medication.  Follow pressures.  Follow metabolic panel.

## 2021-11-25 NOTE — Assessment & Plan Note (Signed)
Low carb diet and exercise.  Follow met b and a1c.

## 2021-11-25 NOTE — Assessment & Plan Note (Signed)
Increased stress as outlined.  Discussed.  Desires no further intervention.  Follow.

## 2021-11-25 NOTE — Assessment & Plan Note (Signed)
Has seen gyn and urology.  Has f/u planned with Dr Kenton Kingfisher - pessary.  Last seen 12/2020.

## 2021-11-25 NOTE — Assessment & Plan Note (Signed)
Handling stress.  Does not feel needs any further intervention.  Follow.   

## 2021-12-06 ENCOUNTER — Ambulatory Visit: Payer: Self-pay

## 2021-12-06 NOTE — Patient Outreach (Signed)
  Care Coordination   Initial Visit Note   12/06/2021 Name: Donna Lara MRN: [id number] DOB: 11-01-1940  Donna Lara is a 81 y.o. year old female who sees [name], MD for primary care. I spoke with  Minerva Areola Gasior by phone today.  What matters to the patients health and wellness today?  Patient states she is riding in the car and having poor reception.  She states she is interested in talking further about the care coordination services and request a call back next week.     Goals Addressed   None     SDOH assessments and interventions completed:  No     Care Coordination Interventions Activated:  Yes  Care Coordination Interventions:  Yes, provided   Follow up plan: Follow up call scheduled for 12/11/21 at 3:00 pm    Encounter Outcome:  Pt. Visit Completed

## 2021-12-10 ENCOUNTER — Encounter: Payer: Self-pay | Admitting: Family Medicine

## 2021-12-10 ENCOUNTER — Telehealth (INDEPENDENT_AMBULATORY_CARE_PROVIDER_SITE_OTHER): Payer: Medicare Other | Admitting: Family Medicine

## 2021-12-10 VITALS — Ht 62.0 in | Wt 131.0 lb

## 2021-12-10 DIAGNOSIS — J40 Bronchitis, not specified as acute or chronic: Secondary | ICD-10-CM

## 2021-12-10 DIAGNOSIS — J209 Acute bronchitis, unspecified: Secondary | ICD-10-CM | POA: Insufficient documentation

## 2021-12-10 MED ORDER — GUAIFENESIN-DM 100-10 MG/5ML PO SYRP: 5.0000 mL | ORAL_SOLUTION | ORAL | 0 refills | Status: DC | PRN

## 2021-12-10 MED ORDER — FEXOFENADINE HCL 60 MG PO TABS: 60.0000 mg | ORAL_TABLET | Freq: Two times a day (BID) | ORAL | 1 refills | Status: AC

## 2021-12-10 NOTE — Patient Instructions (Addendum)
It was a pleasure meeting you today. Thank you for allowing me to take part in your health care.  Our goals for today as we discussed include:  I think your allergies have been triggered Please continue to use your Flonase 2 sprays at night Start Allegra 60 mg twice daily Start Robitussin DM 5 mls every four hours as needed for cough Drink plenty of water to stay hydrated Use honey tea to help with cough Avoid any allergens if possible Use Humidifier as needed If you have worsening symptoms, any fevers, shortness of breath, chest pain, difficulty breathing please please call 911 or have some one take you to the Urgent care  Please call clinic in couple of days to let MD know if you are not improving   Please follow-up with PCP if no improvement in symptoms.  If you have any questions or concerns, please do not hesitate to call the office at [phone].  I look forward to our next visit and until then take care and stay safe.  Regards,   Carollee Leitz, MD   [facility]

## 2021-12-10 NOTE — Progress Notes (Signed)
[facility] Telemedicine Visit  Patient consented to have virtual visit and was identified by name and date of birth. Method of visit: Video  Encounter participants: Patient: Donna Lara - located at home Provider: Carollee Leitz - located at office Others (if applicable): no  Chief Complaint: cough  HPI:  Cough ongoing for 1 week.  Went to church last week.  Noticed cough worsened after church.  Cough worse at night.  No runny nose.  Denies any fevers, chest pain, shortness of breath, no difficulty breath, no body aches.  Appetite good, no weight loss.  Drinking plenty of fluids. No recent sick contacts.  No tobacco use.  Not much sputum production.  Has not had home COVID testing. Reports her daughter and son has similar symptoms and were started on Zpak which improved symptoms.  ROS: per HPI  Pertinent PMHx:  Seasonal allergies HTN  Exam:  Ht '5\' 2"'$  (1.575 m)   Wt 131 lb (59.4 kg)   LMP 04/28/1992   BMI 23.96 kg/m   Respiratory: Speaking in full sentences.  No increased work of breathing and was walking without any shortness of breath.  Intermittent harsh cough without mucus production.    Assessment/Plan:  Acute bronchitis Likely viral URI.  Low suspicion for bacterial or cardiac etiology given no fevers, nonproductive cough, no dyspnea on exertion or difficulty breathing.  Patient has increased exposure to environmental dust which may also be contributing to cough. -Continue Flonase 2 sprays at night -Start Allegra 60 mg daily -Start Robitussin DM 5 ml q4 hs as needed for cough -Offered COVID testing, patient politely declined -If no improvement in symptoms notify MD -Strict return precautions provided    Time spent during visit with patient: 20 minutes

## 2021-12-10 NOTE — Assessment & Plan Note (Signed)
Likely viral URI.  Low suspicion for bacterial or cardiac etiology given no fevers, nonproductive cough, no dyspnea on exertion or difficulty breathing.  Patient has increased exposure to environmental dust which may also be contributing to cough. -Continue Flonase 2 sprays at night -Start Allegra 60 mg daily -Start Robitussin DM 5 ml q4 hs as needed for cough -Offered COVID testing, patient politely declined -If no improvement in symptoms notify MD -Strict return precautions provided

## 2021-12-11 ENCOUNTER — Ambulatory Visit: Payer: Self-pay

## 2021-12-11 NOTE — Patient Outreach (Signed)
  Care Coordination   Initial Visit Note   12/11/2021 Name: MEISHA SALONE MRN: [id number] DOB: Mar 04, 1941  Amelya Mabry [place] is a 81 y.o. year old female who sees [name], MD for primary care. I spoke with  Minerva Areola Ormand by phone today.  What matters to the patients health and wellness today?  Patient reports having a cough. She states she was able to have a video visit on yesterday with the provider and was prescribed medication. She denies any further concerns regarding her cough.  Patient states she does not have any nursing or community resource needs at this time.     Goals Addressed             This Visit's Progress    COMPLETED: Care coordination activities - No follow up required       Care Coordination Interventions: Care coordination program/ services discussed Social determinants of health survey completed Discussed annual wellness visit and advised patient to call provider office to schedule Advised patient to contact primary care provider or RNCM if care coordination services needed in the future.  Patient advised to notify provider if cough symptoms persist.       Patient Stated          SDOH assessments and interventions completed:  Yes     Care Coordination Interventions Activated:  Yes  Care Coordination Interventions:  Yes, provided   Follow up plan: No further intervention required.   Encounter Outcome:  Pt. Visit Completed   Quinn Plowman RN,BSN,CCM [place] Coordinator [phone]

## 2021-12-11 NOTE — Patient Instructions (Signed)
Visit Information  Thank you for taking time to visit with me today. Please don't hesitate to contact me if I can be of assistance to you.   Following are the goals we discussed today:   Goals Addressed             This Visit's Progress    COMPLETED: Care coordination activities - No follow up required       Care Coordination Interventions: Care coordination program/ services discussed Social determinants of health survey completed Discussed annual wellness visit and advised patient to call provider office to schedule Advised patient to contact primary care provider or RNCM if care coordination services needed in the future.  Patient advised to notify provider if cough symptoms persist.       Patient Stated            If you are experiencing a Mental Health or [place] or need someone to talk to, please call 1-800-273-TALK (toll free, 24 hour hotline)  The patient verbalized understanding of instructions, educational materials, and care plan provided today and DECLINED offer to receive copy of patient instructions, educational materials, and care plan.   Quinn Plowman RN,BSN,CCM RN Care Manager Coordinator [phone]

## 2021-12-13 ENCOUNTER — Other Ambulatory Visit: Payer: Self-pay | Admitting: Internal Medicine

## 2021-12-13 ENCOUNTER — Telehealth: Payer: Self-pay | Admitting: Internal Medicine

## 2021-12-13 DIAGNOSIS — J40 Bronchitis, not specified as acute or chronic: Secondary | ICD-10-CM

## 2021-12-13 MED ORDER — AZITHROMYCIN 250 MG PO TABS: ORAL_TABLET | ORAL | 0 refills | Status: AC

## 2021-12-13 NOTE — Telephone Encounter (Signed)
Patient had a virtual with Dr Volanda Napoleon yesterday. She is coughing, low fever. She would like a Z pack.

## 2021-12-13 NOTE — Telephone Encounter (Signed)
Sent zpack

## 2021-12-19 ENCOUNTER — Telehealth: Payer: Self-pay

## 2021-12-19 DIAGNOSIS — N393 Stress incontinence (female) (male): Secondary | ICD-10-CM | POA: Diagnosis not present

## 2021-12-19 DIAGNOSIS — N3281 Overactive bladder: Secondary | ICD-10-CM | POA: Diagnosis not present

## 2021-12-19 DIAGNOSIS — N814 Uterovaginal prolapse, unspecified: Secondary | ICD-10-CM | POA: Diagnosis not present

## 2021-12-19 NOTE — Telephone Encounter (Signed)
Please forward to PCP

## 2021-12-19 NOTE — Telephone Encounter (Signed)
See if she will do a virtual visit with me at 2:00 tomorrow.

## 2021-12-19 NOTE — Telephone Encounter (Signed)
Donna states she is returning our call.  I read Dr. Randell Donna Lara's message to Donna.  Donna has been scheduled for a telephone visit with Dr. Nicki Reaper on 12/20/2021 at 2pm.

## 2021-12-19 NOTE — Telephone Encounter (Signed)
Patient states she still has her really bad cough.  Patient states it may just barely be better.  Patient states the medicine and cough syrup she has tried so far hasn't really helped.  Patient states her granddaughter had just a little cough syrup that had codeine left in a bottle and she tried that and it helped her to sleep.  Patient states she also tried her granddaughter's new inhaler and it did help loosen the stuff in her chest.  Patient states she would be interested in having some cough syrup with codeine, if possible.  *Patient's preferred pharmacy is [facility] in [place].

## 2021-12-19 NOTE — Telephone Encounter (Signed)
I called and LVM for the patient to call back. The provider wants to do a virtual visit with patient tomorrow at 2 pm. Called to inform patient.  Donna Lara,cma

## 2021-12-20 ENCOUNTER — Ambulatory Visit
Admission: RE | Admit: 2021-12-20 | Discharge: 2021-12-20 | Disposition: A | Discharge status: Home / Self Care | Payer: Medicare Other | Attending: Internal Medicine | Admitting: Internal Medicine

## 2021-12-20 ENCOUNTER — Ambulatory Visit (INDEPENDENT_AMBULATORY_CARE_PROVIDER_SITE_OTHER): Payer: Medicare Other | Admitting: Internal Medicine

## 2021-12-20 ENCOUNTER — Telehealth: Payer: Self-pay

## 2021-12-20 ENCOUNTER — Ambulatory Visit
Admission: RE | Admit: 2021-12-20 | Discharge: 2021-12-20 | Disposition: A | Discharge status: Home / Self Care | Payer: Medicare Other | Source: Ambulatory Visit | Attending: Internal Medicine | Admitting: Internal Medicine

## 2021-12-20 DIAGNOSIS — R053 Chronic cough: Secondary | ICD-10-CM

## 2021-12-20 DIAGNOSIS — R059 Cough, unspecified: Secondary | ICD-10-CM

## 2021-12-20 DIAGNOSIS — R739 Hyperglycemia, unspecified: Secondary | ICD-10-CM

## 2021-12-20 DIAGNOSIS — J9811 Atelectasis: Secondary | ICD-10-CM | POA: Diagnosis not present

## 2021-12-20 MED ORDER — PREDNISONE 10 MG PO TABS: ORAL_TABLET | ORAL | 0 refills | Status: DC

## 2021-12-20 MED ORDER — AMOXICILLIN-POT CLAVULANATE 875-125 MG PO TABS: 1.0000 | ORAL_TABLET | Freq: Two times a day (BID) | ORAL | 0 refills | Status: DC

## 2021-12-20 NOTE — Progress Notes (Unsigned)
[name], female   DOB: [birth year], 81 y.o.   MRN: [id number]   Subjective:    [name], female    DOB: [birth year], 81 y.o.   MRN: [id number]  This visit occurred during the SARS-CoV-2 public health emergency.  Safety protocols were in place, including screening questions prior to the visit, additional usage of staff PPE, and extensive cleaning of exam room while observing appropriate contact time as indicated for disinfecting solutions.   Patient here for  Chief Complaint  Patient presents with   Cough    Bad cough 10+ days - states cough is worse at night  Can not lay flat has to prop up  Patient took all of the Robitussin DM Patient is sore from coughing and tired Coughs up a little colored gunk sometimes   .   HPI    Past Medical History:  Diagnosis Date   Allergy    Carpal tunnel syndrome 1999   GERD (gastroesophageal reflux disease)    Hypercholesterolemia    By last lipid panel   Hypertension    Borderline. She does not take the 12.5 HCTZ daily   Past Surgical History:  Procedure Laterality Date   APPENDECTOMY  1963   BREAST CYST ASPIRATION Right 07/12/2020   CARPAL TUNNEL RELEASE     right hand   COLONOSCOPY     COLONOSCOPY WITH PROPOFOL N/A 12/01/2016   Procedure: COLONOSCOPY WITH PROPOFOL;  Surgeon: Lucilla Lame, MD;  Location: [place];  Service: Gastroenterology;  Laterality: N/A;   EYE SURGERY     left   POLYPECTOMY  12/01/2016   Procedure: POLYPECTOMY INTESTINAL;  Surgeon: Lucilla Lame, MD;  Location: [place];  Service: Gastroenterology;;  Transverse colon polyp x 2 Ascending colon polyp x 3   TONSILLECTOMY     Family History  Problem Relation Age of Onset   Diabetes Mother    Hypertension Brother    Diabetes Maternal Grandmother    Asthma Daughter    Breast cancer Other    Colon cancer Neg Hx    Social History   Socioeconomic History   Marital status: Married    Spouse name: Not on file   Number  of children: 3   Years of education: Not on file   Highest education level: Not on file  Occupational History    Employer: OTHER  Tobacco Use   Smoking status: Never   Smokeless tobacco: Never  Vaping Use   Vaping Use: Never used  Substance and Sexual Activity   Alcohol use: Yes    Alcohol/week: 0.0 standard drinks of alcohol    Comment: 2-3 drinks/month   Drug use: No   Sexual activity: Never  Other Topics Concern   Not on file  Social History Narrative   Recently widowed. Her husband died of a heart attack in 05-10-2015   Social Determinants of Health   Financial Resource Strain: Low Risk  (11/10/2017)   Overall Financial Resource Strain (CARDIA)    Difficulty of Paying Living Expenses: Not hard at all  Food Insecurity: No Food Insecurity (12/11/2021)   Hunger Vital Sign    Worried About Running Out of Food in the Last Year: Never true    Ran Out of Food in the Last Year: Never true  Transportation Needs: No Transportation Needs (12/11/2021)   PRAPARE - Hydrologist (Medical): No    Lack of Transportation (Non-Medical): No  Physical Activity: Sufficiently  Active (11/10/2017)   Exercise Vital Sign    Days of Exercise per Week: 4 days    Minutes of Exercise per Session: 90 min  Stress: No Stress Concern Present (11/10/2017)   [place]    Feeling of Stress : Not at all  Social Connections: Not on file     Review of Systems     Objective:     LMP 04/28/1992  Wt Readings from Last 3 Encounters:  12/10/21 131 lb (59.4 kg)  11/18/21 132 lb 3.2 oz (60 kg)  10/02/21 131 lb (59.4 kg)    Physical Exam   Outpatient Encounter Medications as of 12/20/2021  Medication Sig   amLODipine (NORVASC) 2.5 MG tablet Take 1 tablet (2.5 mg total) by mouth daily.   amoxicillin-clavulanate (AUGMENTIN) 875-125 MG tablet Take 1 tablet by mouth 2 (two) times daily.   Cholecalciferol (VITAMIN  D3) 1000 UNITS CAPS Take 1,000 Units by mouth 2 (two) times daily.    CYANOCOBALAMIN PO Take 1 tablet by mouth daily.    diphenhydrAMINE HCl, Sleep, (ZZZQUIL PO) Take by mouth. 3 GUMMIES PER NIGHT   fexofenadine (ALLEGRA ALLERGY) 60 MG tablet Take 1 tablet (60 mg total) by mouth 2 (two) times daily.   fluticasone (FLONASE) 50 MCG/ACT nasal spray Place 2 sprays into both nostrils daily.   magnesium 30 MG tablet Take 30 mg by mouth 1 day or 1 dose.   Nutritional Supplements (JOINT FORMULA PO) Take by mouth.   Potassium 95 MG TABS Take by mouth.   predniSONE (DELTASONE) 10 MG tablet Take 6 tablets x 1 day and then decrease by 1/2 tablet per day until down to zero mg.   [DISCONTINUED] guaiFENesin-dextromethorphan (ROBITUSSIN DM) 100-10 MG/5ML syrup Take 5 mLs by mouth every 4 (four) hours as needed for cough. (Patient not taking: Reported on 12/20/2021)   No facility-administered encounter medications on file as of 12/20/2021.     Lab Results  Component Value Date   WBC 8.0 04/03/2021   HGB 13.4 04/03/2021   HCT 40.9 04/03/2021   PLT 337.0 04/03/2021   GLUCOSE 87 11/15/2021   CHOL 211 (H) 11/15/2021   TRIG 95.0 11/15/2021   HDL 62.10 11/15/2021   LDLCALC 130 (H) 11/15/2021   ALT 12 11/15/2021   AST 19 11/15/2021   NA 139 11/15/2021   K 3.8 11/15/2021   CL 103 11/15/2021   CREATININE 0.82 11/15/2021   BUN 16 11/15/2021   CO2 27 11/15/2021   TSH 1.18 07/05/2021   HGBA1C 6.2 11/15/2021    MM 3D SCREEN BREAST BILATERAL  Result Date: 07/29/2021 CLINICAL DATA:  Screening. EXAM: DIGITAL SCREENING BILATERAL MAMMOGRAM WITH TOMOSYNTHESIS AND CAD TECHNIQUE: Bilateral screening digital craniocaudal and mediolateral oblique mammograms were obtained. Bilateral screening digital breast tomosynthesis was performed. The images were evaluated with computer-aided detection. COMPARISON:  Previous exam(s). ACR Breast Density Category b: There are scattered areas of fibroglandular density. FINDINGS: There  are no findings suspicious for malignancy. IMPRESSION: No mammographic evidence of malignancy. A result letter of this screening mammogram will be mailed directly to the patient. RECOMMENDATION: Screening mammogram in one year. (Code:SM-B-01Y) BI-RADS CATEGORY  1: Negative. Electronically Signed   By: Ammie Ferrier M.D.   On: 07/29/2021 08:08       Assessment & Plan:   Problem List Items Addressed This Visit     Persistent cough   Relevant Orders   DG Chest 2 [address],  MD

## 2021-12-20 NOTE — Telephone Encounter (Signed)
Called and checked Patient in and sent her a direct link to the video visit.

## 2021-12-22 ENCOUNTER — Encounter: Payer: Self-pay | Admitting: Internal Medicine

## 2021-12-22 NOTE — Assessment & Plan Note (Signed)
Low carb diet and exercise.  Follow met b and a1c.  Prednisone can increased sugar.  Follow.

## 2021-12-22 NOTE — Assessment & Plan Note (Signed)
Persistent cough and congestion as outlined.  Occasional mucus production.  Coughing fits.  Recently treated with zpak.  She is feeling some better, but persistent increased cough as outlined.  Treat with augmentin as directed.  Discussed probiotics with abx and for two weeks after.  Can continue flonase.  Prednisone taper as directed.  Robitussin DM as directed.  Check cxr.  Follow closely.

## 2021-12-22 NOTE — Progress Notes (Signed)
[name], female   DOB: [birth year], 81 y.o.   MRN: [id number]   Virtual Visit via telephone Note   All issues noted in this document were discussed and addressed.  No physical exam was performed (except for noted visual exam findings with Video Visits).   I connected with Michail Jewels by telephone and verified that I am speaking with the correct person using two identifiers. Location patient: home Location provider: work Persons participating in the telephone visit: patient, provider  The limitations, risks, security and privacy concerns of performing an evaluation and management service by telephone and the availability of in person appointments have been discussed.  It has also been discussed with the patient that there may be a patient responsible charge related to this service. The patient expressed understanding and agreed to proceed.   Reason for visit: work in appt  HPI: Work in with persistent cough.  Evaluated 12/10/21 - cough x 1 week. Recommended allegra, robitussin DM and flonase. Called in requesting something for cough.  Reports that for at least the last two weeks - persistent cough.  Increased chest congestion - productive of mucus at times.  Coughing fits.  No sinus pressure.  Minimal irritation in throat from cough.  No fever. Worse at night.  Is eating.  Decreased appetite.  No nausea or vomiting.  No chest pain.  Previous covid test - negative.    ROS: See pertinent positives and negatives per HPI.  Past Medical History:  Diagnosis Date   Allergy    Carpal tunnel syndrome 1999   GERD (gastroesophageal reflux disease)    Hypercholesterolemia    By last lipid panel   Hypertension    Borderline. She does not take the 12.5 HCTZ daily    Past Surgical History:  Procedure Laterality Date   APPENDECTOMY  1963   BREAST CYST ASPIRATION Right 07/12/2020   CARPAL TUNNEL RELEASE     right hand   COLONOSCOPY     COLONOSCOPY WITH PROPOFOL N/A 12/01/2016   Procedure:  COLONOSCOPY WITH PROPOFOL;  Surgeon: Lucilla Lame, MD;  Location: [place];  Service: Gastroenterology;  Laterality: N/A;   EYE SURGERY     left   POLYPECTOMY  12/01/2016   Procedure: POLYPECTOMY INTESTINAL;  Surgeon: Lucilla Lame, MD;  Location: [place];  Service: Gastroenterology;;  Transverse colon polyp x 2 Ascending colon polyp x 3   TONSILLECTOMY      Family History  Problem Relation Age of Onset   Diabetes Mother    Hypertension Brother    Diabetes Maternal Grandmother    Asthma Daughter    Breast cancer Other    Colon cancer Neg Hx     SOCIAL HX: reviewed.    Current Outpatient Medications:    amLODipine (NORVASC) 2.5 MG tablet, Take 1 tablet (2.5 mg total) by mouth daily., Disp: 90 tablet, Rfl: 2   amoxicillin-clavulanate (AUGMENTIN) 875-125 MG tablet, Take 1 tablet by mouth 2 (two) times daily., Disp: 14 tablet, Rfl: 0   Cholecalciferol (VITAMIN D3) 1000 UNITS CAPS, Take 1,000 Units by mouth 2 (two) times daily. , Disp: , Rfl:    CYANOCOBALAMIN PO, Take 1 tablet by mouth daily. , Disp: , Rfl:    diphenhydrAMINE HCl, Sleep, (ZZZQUIL PO), Take by mouth. 3 GUMMIES PER NIGHT, Disp: , Rfl:    fexofenadine (ALLEGRA ALLERGY) 60 MG tablet, Take 1 tablet (60 mg total) by mouth 2 (two) times daily., Disp: 60 tablet, Rfl: 1   fluticasone (  FLONASE) 50 MCG/ACT nasal spray, Place 2 sprays into both nostrils daily., Disp: 16 g, Rfl: 6   magnesium 30 MG tablet, Take 30 mg by mouth 1 day or 1 dose., Disp: , Rfl:    Nutritional Supplements (JOINT FORMULA PO), Take by mouth., Disp: , Rfl:    Potassium 95 MG TABS, Take by mouth., Disp: , Rfl:    predniSONE (DELTASONE) 10 MG tablet, Take 6 tablets x 1 day and then decrease by 1/2 tablet per day until down to zero mg., Disp: 39 tablet, Rfl: 0  EXAM:  GENERAL: alert. Sounds to be in no acute distress.  Answering questions appropriately.   PSYCH/NEURO: pleasant and cooperative, no obvious depression or anxiety, speech  and thought processing grossly intact  ASSESSMENT AND PLAN:  Discussed the following assessment and plan:  Problem List Items Addressed This Visit     Cough    Persistent cough and congestion as outlined.  Occasional mucus production.  Coughing fits.  Recently treated with zpak.  She is feeling some better, but persistent increased cough as outlined.  Treat with augmentin as directed.  Discussed probiotics with abx and for two weeks after.  Can continue flonase.  Prednisone taper as directed.  Robitussin DM as directed.  Check cxr.  Follow closely.        Hyperglycemia    Low carb diet and exercise.  Follow met b and a1c.  Prednisone can increased sugar.  Follow.       Persistent cough    Persistent cough and congestion as outlined.  Occasional mucus production.  Coughing fits.  Recently treated with zpak.  She is feeling some better, but persistent increased cough as outlined.  Treat with augmentin as directed.  Discussed probiotics with abx and for two weeks after.  Can continue flonase.  Prednisone taper as directed.  Robitussin DM as directed.  Check cxr.  Follow closely.        Relevant Orders   DG Chest 2 View (Completed)    Return if symptoms worsen or fail to improve, for keep scheduled.   I discussed the assessment and treatment plan with the patient. The patient was provided an opportunity to ask questions and all were answered. The patient agreed with the plan and demonstrated an understanding of the instructions.   The patient was advised to call back or seek an in-person evaluation if the symptoms worsen or if the condition fails to improve as anticipated.  I provided 24 minutes of non-face-to-face time during this encounter.   [name], MD

## 2021-12-24 ENCOUNTER — Encounter: Payer: Self-pay | Admitting: Internal Medicine

## 2021-12-24 DIAGNOSIS — R9389 Abnormal findings on diagnostic imaging of other specified body structures: Secondary | ICD-10-CM | POA: Insufficient documentation

## 2021-12-24 DIAGNOSIS — N8110 Cystocele, unspecified: Secondary | ICD-10-CM | POA: Diagnosis not present

## 2021-12-30 ENCOUNTER — Other Ambulatory Visit (INDEPENDENT_AMBULATORY_CARE_PROVIDER_SITE_OTHER): Payer: Medicare Other

## 2021-12-30 DIAGNOSIS — E7800 Pure hypercholesterolemia, unspecified: Secondary | ICD-10-CM

## 2021-12-30 LAB — HEPATIC FUNCTION PANEL
ALT: 14 U/L (ref 0–35)
AST: 16 U/L (ref 0–37)
Albumin: 4 g/dL (ref 3.5–5.2)
Alkaline Phosphatase: 82 U/L (ref 39–117)
Bilirubin, Direct: 0.1 mg/dL (ref 0.0–0.3)
Total Bilirubin: 0.4 mg/dL (ref 0.2–1.2)
Total Protein: 6.8 g/dL (ref 6.0–8.3)

## 2022-01-02 ENCOUNTER — Telehealth: Payer: Self-pay | Admitting: Internal Medicine

## 2022-01-02 NOTE — Telephone Encounter (Signed)
I called and spoke with the patient and I reread her xray results to her, she stated she just wanted to inform her children that the results were okay. Dametrius Sanjuan,cma

## 2022-01-02 NOTE — Telephone Encounter (Signed)
Patient had a xray done at hospital, a couple of weeks ago. She was calling for results.

## 2022-02-14 DIAGNOSIS — N814 Uterovaginal prolapse, unspecified: Secondary | ICD-10-CM | POA: Diagnosis not present

## 2022-02-18 ENCOUNTER — Encounter: Payer: Self-pay | Admitting: Internal Medicine

## 2022-02-18 ENCOUNTER — Ambulatory Visit (INDEPENDENT_AMBULATORY_CARE_PROVIDER_SITE_OTHER): Payer: Medicare Other | Admitting: Internal Medicine

## 2022-02-18 VITALS — BP 140/78 | HR 73 | Temp 98.5°F | Resp 17 | Ht 62.0 in | Wt 133.6 lb

## 2022-02-18 DIAGNOSIS — F419 Anxiety disorder, unspecified: Secondary | ICD-10-CM | POA: Diagnosis not present

## 2022-02-18 DIAGNOSIS — H34219 Partial retinal artery occlusion, unspecified eye: Secondary | ICD-10-CM

## 2022-02-18 DIAGNOSIS — R739 Hyperglycemia, unspecified: Secondary | ICD-10-CM | POA: Diagnosis not present

## 2022-02-18 DIAGNOSIS — Z86010 Personal history of colonic polyps: Secondary | ICD-10-CM

## 2022-02-18 DIAGNOSIS — Z1211 Encounter for screening for malignant neoplasm of colon: Secondary | ICD-10-CM | POA: Diagnosis not present

## 2022-02-18 DIAGNOSIS — I10 Essential (primary) hypertension: Secondary | ICD-10-CM | POA: Diagnosis not present

## 2022-02-18 DIAGNOSIS — E7800 Pure hypercholesterolemia, unspecified: Secondary | ICD-10-CM | POA: Diagnosis not present

## 2022-02-18 DIAGNOSIS — Z0000 Encounter for general adult medical examination without abnormal findings: Secondary | ICD-10-CM

## 2022-02-18 DIAGNOSIS — N812 Incomplete uterovaginal prolapse: Secondary | ICD-10-CM

## 2022-02-18 DIAGNOSIS — G479 Sleep disorder, unspecified: Secondary | ICD-10-CM | POA: Diagnosis not present

## 2022-02-18 MED ORDER — AMLODIPINE BESYLATE 5 MG PO TABS: 5.0000 mg | ORAL_TABLET | Freq: Every day | ORAL | 1 refills | Status: DC

## 2022-02-18 NOTE — Progress Notes (Signed)
[name], female   DOB: 05-Nov-1940, 81 y.o.   MRN: [id number]   Subjective:    [name], female    DOB: 31-Oct-1940, 81 y.o.   MRN: [id number]    HPI With past history of hypertension and hypercholesterolemia.  Comes in today to follow up on these issues as well as for a complete physical exam.  Stays active.  No chest pain or sob reported.  No cough or congestion.  No abdominal pain.  Bowels moving.  With bladder prolapse.  Followed - Donna Lara.  Having problems with sleep.  Discussed.  Taking otc sleep aids.  Discussed due colonoscopy.     Past Medical History:  Diagnosis Date   Allergy    Carpal tunnel syndrome 1999   GERD (gastroesophageal reflux disease)    Hypercholesterolemia    By last lipid panel   Hypertension    Borderline. She does not take the 12.5 HCTZ daily   Past Surgical History:  Procedure Laterality Date   APPENDECTOMY  1963   BREAST CYST ASPIRATION Right 07/12/2020   CARPAL TUNNEL RELEASE     right hand   COLONOSCOPY     COLONOSCOPY WITH PROPOFOL N/A 12/01/2016   Procedure: COLONOSCOPY WITH PROPOFOL;  Surgeon: Lucilla Lame, MD;  Location: [place];  Service: Gastroenterology;  Laterality: N/A;   EYE SURGERY     left   POLYPECTOMY  12/01/2016   Procedure: POLYPECTOMY INTESTINAL;  Surgeon: Lucilla Lame, MD;  Location: [place];  Service: Gastroenterology;;  Transverse colon polyp x 2 Ascending colon polyp x 3   TONSILLECTOMY     Family History  Problem Relation Age of Onset   Diabetes Mother    Hypertension Brother    Diabetes Maternal Grandmother    Asthma Daughter    Breast cancer Other    Colon cancer Neg Hx    Social History   Socioeconomic History   Marital status: Married    Spouse name: Not on file   Number of children: 3   Years of education: Not on file   Highest education level: Not on file  Occupational History    Employer: OTHER  Tobacco Use   Smoking status: Never   Smokeless tobacco: Never   Vaping Use   Vaping Use: Never used  Substance and Sexual Activity   Alcohol use: Yes    Alcohol/week: 0.0 standard drinks of alcohol    Comment: 2-3 drinks/month   Drug use: No   Sexual activity: Never  Other Topics Concern   Not on file  Social History Narrative   Recently widowed. Her husband died of a heart attack in May 01, 2015   Social Determinants of Health   Financial Resource Strain: Low Risk  (11/10/2017)   Overall Financial Resource Strain (CARDIA)    Difficulty of Paying Living Expenses: Not hard at all  Food Insecurity: No Food Insecurity (12/11/2021)   Hunger Vital Sign    Worried About Running Out of Food in the Last Year: Never true    Ran Out of Food in the Last Year: Never true  Transportation Needs: No Transportation Needs (12/11/2021)   PRAPARE - Hydrologist (Medical): No    Lack of Transportation (Non-Medical): No  Physical Activity: Sufficiently Active (11/10/2017)   Exercise Vital Sign    Days of Exercise per Week: 4 days    Minutes of Exercise per Session: 90 min  Stress: No Stress Concern Present (11/10/2017)  [place] Stress Questionnaire    Feeling of Stress : Not at all  Social Connections: Not on file     Review of Systems  Constitutional:  Negative for appetite change and unexpected weight change.  HENT:  Negative for congestion, sinus pressure and sore throat.   Eyes:  Negative for pain and visual disturbance.  Respiratory:  Negative for cough, chest tightness and shortness of breath.   Cardiovascular:  Negative for chest pain, palpitations and leg swelling.  Gastrointestinal:  Negative for abdominal pain, diarrhea, nausea and vomiting.  Genitourinary:  Negative for difficulty urinating and dysuria.  Musculoskeletal:  Negative for joint swelling and myalgias.  Skin:  Negative for color change and rash.  Neurological:  Negative for dizziness, light-headedness and  headaches.  Hematological:  Negative for adenopathy. Does not bruise/bleed easily.  Psychiatric/Behavioral:  Positive for sleep disturbance. Negative for agitation and dysphoric mood.        Objective:     BP (!) 140/78 (BP Location: Left Arm, Patient Position: Sitting, Cuff Size: Small)   Pulse 73   Temp 98.5 F (36.9 C) (Temporal)   Resp 17   Ht _0  (1.575 m)   Wt 133 lb 9.6 oz (60.6 kg)   LMP 04/28/1992   SpO2 97%   BMI 24.44 kg/m  Wt Readings from Last 3 Encounters:  02/18/22 133 lb 9.6 oz (60.6 kg)  12/10/21 131 lb (59.4 kg)  11/18/21 132 lb 3.2 oz (60 kg)    Physical Exam Vitals reviewed.  Constitutional:      General: She is not in acute distress.    Appearance: Normal appearance. She is well-developed.  HENT:     Head: Normocephalic and atraumatic.     Right Ear: External ear normal.     Left Ear: External ear normal.  Eyes:     General: No scleral icterus.       Right eye: No discharge.        Left eye: No discharge.     Conjunctiva/sclera: Conjunctivae normal.  Neck:     Thyroid: No thyromegaly.  Cardiovascular:     Rate and Rhythm: Normal rate and regular rhythm.  Pulmonary:     Effort: No tachypnea, accessory muscle usage or respiratory distress.     Breath sounds: Normal breath sounds. No decreased breath sounds or wheezing.  Chest:  Breasts:    Right: No inverted nipple, mass, nipple discharge or tenderness (no axillary adenopathy).     Left: No inverted nipple, mass, nipple discharge or tenderness (no axilarry adenopathy).  Abdominal:     General: Bowel sounds are normal.     Palpations: Abdomen is soft.     Tenderness: There is no abdominal tenderness.  Musculoskeletal:        General: No swelling or tenderness.     Cervical back: Neck supple.  Lymphadenopathy:     Cervical: No cervical adenopathy.  Skin:    Findings: No erythema or rash.  Neurological:     Mental Status: She is alert and oriented to person, place, and time.   Psychiatric:        Mood and Affect: Mood normal.        Behavior: Behavior normal.      Outpatient Encounter Medications as of 02/18/2022  Medication Sig   amLODipine (NORVASC) 5 MG tablet Take 1 tablet (5 mg total) by mouth daily.   amoxicillin-clavulanate (AUGMENTIN) 875-125 MG tablet Take 1 tablet by mouth 2 (two)  times daily.   Cholecalciferol (VITAMIN D3) 1000 UNITS CAPS Take 1,000 Units by mouth 2 (two) times daily.    CYANOCOBALAMIN PO Take 1 tablet by mouth daily.    diphenhydrAMINE HCl, Sleep, (ZZZQUIL PO) Take by mouth. 3 GUMMIES PER NIGHT   fexofenadine (ALLEGRA ALLERGY) 60 MG tablet Take 1 tablet (60 mg total) by mouth 2 (two) times daily.   fluticasone (FLONASE) 50 MCG/ACT nasal spray Place 2 sprays into both nostrils daily.   magnesium 30 MG tablet Take 30 mg by mouth 1 day or 1 dose.   Nutritional Supplements (JOINT FORMULA PO) Take by mouth.   Potassium 95 MG TABS Take by mouth.   predniSONE (DELTASONE) 10 MG tablet Take 6 tablets x 1 day and then decrease by 1/2 tablet per day until down to zero mg.   [DISCONTINUED] amLODipine (NORVASC) 2.5 MG tablet Take 1 tablet (2.5 mg total) by mouth daily.   No facility-administered encounter medications on file as of 02/18/2022.     Lab Results  Component Value Date   WBC 8.0 04/03/2021   HGB 13.4 04/03/2021   HCT 40.9 04/03/2021   PLT 337.0 04/03/2021   GLUCOSE 87 11/15/2021   CHOL 211 (H) 11/15/2021   TRIG 95.0 11/15/2021   HDL 62.10 11/15/2021   LDLCALC 130 (H) 11/15/2021   ALT 14 12/30/2021   AST 16 12/30/2021   NA 139 11/15/2021   K 3.8 11/15/2021   CL 103 11/15/2021   CREATININE 0.82 11/15/2021   BUN 16 11/15/2021   CO2 27 11/15/2021   TSH 1.18 07/05/2021   HGBA1C 6.2 11/15/2021    DG Chest 2 View  Result Date: 12/20/2021 CLINICAL DATA:  Persistent cough and congestion. EXAM: CHEST - 2 VIEW COMPARISON:  Chest radiograph dated March 22, 2021 FINDINGS: The heart size and mediastinal contours are  within normal limits. Right basilar linear atelectasis. Both lungs are otherwise clear. The visualized skeletal structures are unremarkable. IMPRESSION: No active cardiopulmonary disease. Electronically Signed   By: Keane Police D.O.   On: 12/20/2021 16:10       Assessment & Plan:   Problem List Items Addressed This Visit     Anxiety    Discussed.  Desires no further intervention.  Follow.       Cystocele with incomplete uterovaginal prolapse    Followed by gyn.        Health care maintenance - Primary    Physical today 02/18/22.  Mammogram 07/26/21 - Birads I.  Colonoscopy 11/2016.  Recommended f/u colonoscopy in 5 years.  Due - agreeable for referral to GI.       History of colon polyps    Last colonoscopy 2018.  Dr Allen Norris.  Recommended f/u in 5 years.        Relevant Orders   Ambulatory referral to Gastroenterology   Hollenhorst plaque    Continue crestor.       Relevant Medications   amLODipine (NORVASC) 5 MG tablet   Hypercholesterolemia    The 10-year ASCVD risk score (Arnett DK, et al., 2019) is: 36.5%   Values used to calculate the score:     Age: 60 years     Sex: Female     Is Non-Hispanic African American: No     Diabetic: No     Tobacco smoker: No     Systolic Blood Pressure: 774 mmHg     Is BP treated: Yes     HDL Cholesterol: 62.1 mg/dL     Total  Cholesterol: 211 mg/dL  Continue crestor. Follow lipid panel and liver function tests.  Low cholesterol diet and exercise.       Relevant Medications   amLODipine (NORVASC) 5 MG tablet   Hyperglycemia    Low carb diet and exercise.  Follow met b and a1c. Follow.       Hypertension    On amlodipine.  Blood pressure remains elevated.  Increase amlodipine to 41m q day. Follow pressures.  Follow metabolic panel.       Relevant Medications   amLODipine (NORVASC) 5 MG tablet   Sleep difficulties    Problems with sleep as outlined.  Trial of melatonin.  Follow.       Other Visit Diagnoses     Colon cancer  screening       Relevant Orders   Ambulatory referral to Gastroenterology        CEinar Pheasant MD

## 2022-02-18 NOTE — Patient Instructions (Signed)
Increase amlodipine to '5mg'$  per day.

## 2022-02-18 NOTE — Assessment & Plan Note (Addendum)
Physical today 02/18/22.  Mammogram 07/26/21 - Birads I.  Colonoscopy 11/2016.  Recommended f/u colonoscopy in 5 years.  Due - agreeable for referral to GI.

## 2022-03-02 ENCOUNTER — Encounter: Payer: Self-pay | Admitting: Internal Medicine

## 2022-03-02 DIAGNOSIS — Z86010 Personal history of colonic polyps: Secondary | ICD-10-CM | POA: Insufficient documentation

## 2022-03-02 DIAGNOSIS — G479 Sleep disorder, unspecified: Secondary | ICD-10-CM | POA: Insufficient documentation

## 2022-03-02 NOTE — Assessment & Plan Note (Signed)
Low carb diet and exercise.  Follow met b and a1c.   Follow.  

## 2022-03-02 NOTE — Assessment & Plan Note (Signed)
On amlodipine.  Blood pressure remains elevated.  Increase amlodipine to '5mg'$  q day. Follow pressures.  Follow metabolic panel.

## 2022-03-02 NOTE — Assessment & Plan Note (Signed)
Followed by gyn.

## 2022-03-02 NOTE — Assessment & Plan Note (Signed)
The 10-year ASCVD risk score (Arnett DK, et al., 2019) is: 36.5%   Values used to calculate the score:     Age: 81 years     Sex: Female     Is Non-Hispanic African American: No     Diabetic: No     Tobacco smoker: No     Systolic Blood Pressure: 153 mmHg     Is BP treated: Yes     HDL Cholesterol: 62.1 mg/dL     Total Cholesterol: 211 mg/dL  Continue crestor. Follow lipid panel and liver function tests.  Low cholesterol diet and exercise.

## 2022-03-02 NOTE — Assessment & Plan Note (Signed)
Continue crestor 

## 2022-03-02 NOTE — Assessment & Plan Note (Signed)
Last colonoscopy 2018.  Dr Allen Norris.  Recommended f/u in 5 years.

## 2022-03-02 NOTE — Assessment & Plan Note (Signed)
Discussed.  Desires no further intervention.  Follow.

## 2022-03-02 NOTE — Assessment & Plan Note (Signed)
Problems with sleep as outlined.  Trial of melatonin.  Follow.

## 2022-03-06 ENCOUNTER — Encounter: Payer: Self-pay | Admitting: *Deleted

## 2022-04-05 ENCOUNTER — Encounter: Payer: Self-pay | Admitting: Emergency Medicine

## 2022-04-05 ENCOUNTER — Ambulatory Visit
Admission: EM | Admit: 2022-04-05 | Discharge: 2022-04-05 | Disposition: A | Discharge status: Home / Self Care | Payer: Medicare Other | Attending: Emergency Medicine | Admitting: Emergency Medicine

## 2022-04-05 DIAGNOSIS — U071 COVID-19: Secondary | ICD-10-CM | POA: Insufficient documentation

## 2022-04-05 LAB — SARS CORONAVIRUS 2 BY RT PCR: SARS Coronavirus 2 by RT PCR: POSITIVE — AB

## 2022-04-05 MED ORDER — PROMETHAZINE-DM 6.25-15 MG/5ML PO SYRP: 5.0000 mL | ORAL_SOLUTION | Freq: Four times a day (QID) | ORAL | 0 refills | Status: DC | PRN

## 2022-04-05 MED ORDER — BENZONATATE 100 MG PO CAPS: 200.0000 mg | ORAL_CAPSULE | Freq: Three times a day (TID) | ORAL | 0 refills | Status: DC

## 2022-04-05 MED ORDER — NIRMATRELVIR/RITONAVIR (PAXLOVID)TABLET: 3.0000 | ORAL_TABLET | Freq: Two times a day (BID) | ORAL | 0 refills | Status: AC

## 2022-04-05 NOTE — Discharge Instructions (Signed)
You will have to quarantine for 5 days from the start of your symptoms.  After 5 days you can break quarantine if your symptoms have improved and you have not had a fever for 24 hours without taking Tylenol or ibuprofen. You must wear a mask around others for an additional 5 days.  Use over-the-counter Tylenol and ibuprofen as needed for body aches and fever.  Use the Tessalon Perles every 8 hours during the day.  Take them with a small sip of water.  They may give you some numbness to the base of your tongue or a metallic taste in your mouth, this is normal.  Use the Promethazine DM cough syrup at bedtime for cough and congestion.  It will make you drowsy so do not take it during the day.  Take the Paxlovid twice daily for treatment of COVID-19.  If you develop any increased shortness of breath-especially at rest, you are unable to speak in full sentences, or is a late sign your lips are turning blue you need to go the ER for evaluation.

## 2022-04-05 NOTE — ED Triage Notes (Signed)
Patient c/o cough and congestion that started yesterday.  Patient denies fevers.

## 2022-04-05 NOTE — ED Provider Notes (Signed)
[facility]    CSN: [id number] Arrival date & time: 04/05/22  1155      History   Chief Complaint Chief Complaint  Patient presents with   Cough    HPI Donna Lara is a 81 y.o. female.   HPI  81 year old female presents for evaluation of cough.  Patient reports that she developed symptoms yesterday and they consist of chills last night, chest tightness, and a nonproductive cough.  She denies any associated fever, runny nose or nasal congestion, shortness of breath, or wheezing.  Past Medical History:  Diagnosis Date   Allergy    Carpal tunnel syndrome 1999   GERD (gastroesophageal reflux disease)    Hypercholesterolemia    By last lipid panel   Hypertension    Borderline. She does not take the 12.5 HCTZ daily    Patient Active Problem List   Diagnosis Date Noted   History of colon polyps 03/02/2022   Sleep difficulties 03/02/2022   Abnormal CXR 12/24/2021   Persistent cough 12/20/2021   Acute bronchitis 12/10/2021   Influenza A 03/22/2021   Acute cystitis 03/22/2021   Orthostatic hypotension 03/22/2021   Syncope 03/22/2021   Breast tenderness 10/09/2020   Memory change 06/20/2020   Sinus pressure 04/14/2020   Abnormal mammogram 07/17/2019   Chondrocalcinosis due to pyrophosphate crystals 05/07/2018   Osteoarthritis of knee 05/07/2018   Rectocele 02/25/2018   Cystocele with incomplete uterovaginal prolapse 10/12/2017   Insomnia 08/11/2017   Anxiety 08/11/2017   Cystocele, midline 07/09/2017   Uterine prolapse 07/09/2017   Special screening for malignant neoplasms, colon    Benign neoplasm of ascending colon    Benign neoplasm of transverse colon    Cough 08/20/2015   Hollenhorst plaque 06/09/2015   Osteopenia 02/02/2015   Health care maintenance 08/06/2014   Stress 02/16/2014   Hypertension 05/03/2012   Hypercholesterolemia 05/03/2012   Hyperglycemia 05/03/2012   Environmental allergies 05/03/2012    Past Surgical History:  Procedure  Laterality Date   APPENDECTOMY  1963   BREAST CYST ASPIRATION Right 07/12/2020   CARPAL TUNNEL RELEASE     right hand   COLONOSCOPY     COLONOSCOPY WITH PROPOFOL N/A 12/01/2016   Procedure: COLONOSCOPY WITH PROPOFOL;  Surgeon: Lucilla Lame, MD;  Location: [place];  Service: Gastroenterology;  Laterality: N/A;   EYE SURGERY     left   POLYPECTOMY  12/01/2016   Procedure: POLYPECTOMY INTESTINAL;  Surgeon: Lucilla Lame, MD;  Location: [place];  Service: Gastroenterology;;  Transverse colon polyp x 2 Ascending colon polyp x 3   TONSILLECTOMY      OB History     Gravida  3   Para  3   Term  3   Preterm      AB      Living  3      SAB      IAB      Ectopic      Multiple      Live Births               Home Medications    Prior to Admission medications   Medication Sig Start Date End Date Taking? Authorizing Provider  benzonatate (TESSALON) 100 MG capsule Take 2 capsules (200 mg total) by mouth every 8 (eight) hours. 04/05/22  Yes Margarette Canada, NP  nirmatrelvir/ritonavir (PAXLOVID) 20 x 150 MG & 10 x '100MG'$  TABS Take 3 tablets by mouth 2 (two) times daily for 5 days. 04/05/22 04/10/22  Yes Margarette Canada, NP  promethazine-dextromethorphan (PROMETHAZINE-DM) 6.25-15 MG/5ML syrup Take 5 mLs by mouth 4 (four) times daily as needed. 04/05/22  Yes Margarette Canada, NP  amLODipine (NORVASC) 5 MG tablet Take 1 tablet (5 mg total) by mouth daily. 02/18/22   [name], MD  Cholecalciferol (VITAMIN D3) 1000 UNITS CAPS Take 1,000 Units by mouth 2 (two) times daily.     [provider]  CYANOCOBALAMIN PO Take 1 tablet by mouth daily.     [provider]  diphenhydrAMINE HCl, Sleep, (ZZZQUIL PO) Take by mouth. [place] NIGHT    [provider]  fexofenadine (ALLEGRA ALLERGY) 60 MG tablet Take 1 tablet (60 mg total) by mouth 2 (two) times daily. 12/10/21   Carollee Leitz, MD  fluticasone (FLONASE) 50 MCG/ACT nasal spray Place 2 sprays  into both nostrils daily. 06/08/17   Elby Beck, FNP  magnesium 30 MG tablet Take 30 mg by mouth 1 day or 1 dose.    [provider]  Nutritional Supplements (JOINT FORMULA PO) Take by mouth.    [provider]  Potassium 95 MG TABS Take by mouth.    [provider]    Family History Family History  Problem Relation Age of Onset   Diabetes Mother    Hypertension Brother    Diabetes Maternal Grandmother    Asthma Daughter    Breast cancer Other    Colon cancer Neg Hx     Social History Social History   Tobacco Use   Smoking status: Never   Smokeless tobacco: Never  Vaping Use   Vaping Use: Never used  Substance Use Topics   Alcohol use: Yes    Alcohol/week: 0.0 standard drinks of alcohol    Comment: 2-3 drinks/month   Drug use: No     Allergies   Codeine and Codeine sulfate   Review of Systems Review of Systems  Constitutional:  Positive for chills. Negative for fever.  HENT:  Negative for congestion and rhinorrhea.   Respiratory:  Positive for cough and chest tightness. Negative for shortness of breath and wheezing.      Physical Exam Triage Vital Signs ED Triage Vitals  Enc Vitals Group     BP 04/05/22 1228 (!) 156/68     Pulse Rate 04/05/22 1228 81     Resp 04/05/22 1228 14     Temp 04/05/22 1228 97.9 F (36.6 C)     Temp Source 04/05/22 1228 Oral     SpO2 04/05/22 1228 98 %     Weight 04/05/22 1225 125 lb (56.7 kg)     Height 04/05/22 1225 '5\' 2"'$  (1.575 m)     Head Circumference --      Peak Flow --      Pain Score 04/05/22 1225 0     Pain Loc --      Pain Edu? --      Excl. in [place]? --    No data found.  Updated Vital Signs BP (!) 156/68 (BP Location: Left Arm)   Pulse 81   Temp 97.9 F (36.6 C) (Oral)   Resp 14   Ht '5\' 2"'$  (1.575 m)   Wt 125 lb (56.7 kg)   LMP 04/28/1992   SpO2 98%   BMI 22.86 kg/m   Visual Acuity Right Eye Distance:   Left Eye Distance:   Bilateral Distance:    Right Eye Near:    Left Eye Near:    Bilateral Near:  Physical Exam Vitals and nursing note reviewed.  Constitutional:      Appearance: Normal appearance. She is not ill-appearing.  HENT:     Head: Normocephalic and atraumatic.     Right Ear: Tympanic membrane, ear canal and external ear normal. There is no impacted cerumen.     Left Ear: Tympanic membrane, ear canal and external ear normal. There is no impacted cerumen.     Nose: Nose normal. No congestion or rhinorrhea.     Mouth/Throat:     Mouth: Mucous membranes are moist.     Pharynx: Oropharynx is clear. No oropharyngeal exudate or posterior oropharyngeal erythema.  Cardiovascular:     Rate and Rhythm: Normal rate and regular rhythm.     Pulses: Normal pulses.     Heart sounds: Normal heart sounds. No murmur heard.    No friction rub. No gallop.  Pulmonary:     Effort: Pulmonary effort is normal.     Breath sounds: Normal breath sounds. No wheezing, rhonchi or rales.  Musculoskeletal:     Cervical back: Normal range of motion and neck supple.  Lymphadenopathy:     Cervical: No cervical adenopathy.  Skin:    General: Skin is warm and dry.     Capillary Refill: Capillary refill takes less than 2 seconds.     Findings: No erythema or rash.  Neurological:     General: No focal deficit present.     Mental Status: She is alert and oriented to person, place, and time.  Psychiatric:        Mood and Affect: Mood normal.        Behavior: Behavior normal.        Thought Content: Thought content normal.        Judgment: Judgment normal.      UC Treatments / Results  Labs (all labs ordered are listed, but only abnormal results are displayed) Labs Reviewed  SARS CORONAVIRUS 2 BY RT PCR - Abnormal; Notable for the following components:      Result Value   SARS Coronavirus 2 by RT PCR POSITIVE (*)    All other components within normal limits    EKG   Radiology No results found.  Procedures Procedures (including critical care  time)  Medications Ordered in UC Medications - No data to display  Initial Impression / Assessment and Plan / UC Course  I have reviewed the triage vital signs and the nursing notes.  Pertinent labs & imaging results that were available during my care of the patient were reviewed by me and considered in my medical decision making (see chart for details).   Patient is a pleasant, nontoxic-appearing 81 year old female here for evaluation of cough, chills, chest tightness that began last night.  She denies any upper respiratory symptoms.  She is able to speak in full sentence without any dyspnea or tachypnea.  She had a preceptor cough last year that resulted in a hospitalization and she went to come in to be evaluated to prevent anything like that have from happening again.  Her physical exam does not reveal any inflammation of her upper respiratory tract and her lung sounds are clear to auscultation in all fields.  Given the fact the patient does have a cough and her age I will order a COVID PCR.  COVID PCR is positive.  I will discharge patient home on Paxlovid twice daily for 5 days.  Patient has a CMP on file from 11/15/21 which shows her GFR at  68.35 so there is no need for renal adjustment.  She does not have any nasal congestion swelling I will prescribe an Atrovent nasal spray but I will prescribe Tessalon Perles and Promethazine DM cough syrup for patient.  ER and return precautions reviewed.   Final Clinical Impressions(s) / UC Diagnoses   Final diagnoses:  [id number]     Discharge Instructions      You will have to quarantine for 5 days from the start of your symptoms.  After 5 days you can break quarantine if your symptoms have improved and you have not had a fever for 24 hours without taking Tylenol or ibuprofen. You must wear a mask around others for an additional 5 days.  Use over-the-counter Tylenol and ibuprofen as needed for body aches and fever.  Use the Tessalon Perles  every 8 hours during the day.  Take them with a small sip of water.  They may give you some numbness to the base of your tongue or a metallic taste in your mouth, this is normal.  Use the Promethazine DM cough syrup at bedtime for cough and congestion.  It will make you drowsy so do not take it during the day.  Take the Paxlovid twice daily for treatment of COVID-19.  If you develop any increased shortness of breath-especially at rest, you are unable to speak in full sentences, or is a late sign your lips are turning blue you need to go the ER for evaluation.      ED Prescriptions     Medication Sig Dispense Auth. Provider   benzonatate (TESSALON) 100 MG capsule Take 2 capsules (200 mg total) by mouth every 8 (eight) hours. 21 capsule Margarette Canada, NP   nirmatrelvir/ritonavir (PAXLOVID) 20 x 150 MG & 10 x '100MG'$  TABS Take 3 tablets by mouth 2 (two) times daily for 5 days. 30 tablet Margarette Canada, NP   promethazine-dextromethorphan (PROMETHAZINE-DM) 6.25-15 MG/5ML syrup Take 5 mLs by mouth 4 (four) times daily as needed. 118 mL Margarette Canada, NP      PDMP not reviewed this encounter.   Margarette Canada, NP 04/05/22 1356

## 2022-04-22 DIAGNOSIS — L821 Other seborrheic keratosis: Secondary | ICD-10-CM | POA: Diagnosis not present

## 2022-05-07 DIAGNOSIS — Z01810 Encounter for preprocedural cardiovascular examination: Secondary | ICD-10-CM | POA: Diagnosis not present

## 2022-05-07 DIAGNOSIS — R9431 Abnormal electrocardiogram [ECG] [EKG]: Secondary | ICD-10-CM | POA: Diagnosis not present

## 2022-05-07 DIAGNOSIS — Z01812 Encounter for preprocedural laboratory examination: Secondary | ICD-10-CM | POA: Diagnosis not present

## 2022-05-07 DIAGNOSIS — Z01818 Encounter for other preprocedural examination: Secondary | ICD-10-CM | POA: Diagnosis not present

## 2022-05-09 ENCOUNTER — Telehealth: Payer: Self-pay | Admitting: Internal Medicine

## 2022-05-09 NOTE — Telephone Encounter (Signed)
Noted  

## 2022-05-09 NOTE — Telephone Encounter (Signed)
They did not request a pre op clearance to my knowledge and per last note, was not having any chest pain or sob or other acute issues.  Confirm doing ok and no acute issues.  Keep [place] posted.

## 2022-05-09 NOTE — Telephone Encounter (Signed)
Per chart- She had a pre-op on 1/25 with gynecology. Just wanted to confirm we do not need to do anything more.

## 2022-05-09 NOTE — Telephone Encounter (Signed)
LMTCB

## 2022-05-09 NOTE — Telephone Encounter (Signed)
Patient states she is doing good, no chest pain, sob, or other acute issues.  Patient states she will keep [place] posted.

## 2022-05-09 NOTE — Telephone Encounter (Signed)
Pt called to let Dr. Nicki Reaper knows that she is having a surgery 2/6 @ [facility] in [place].

## 2022-05-13 DIAGNOSIS — M1990 Unspecified osteoarthritis, unspecified site: Secondary | ICD-10-CM | POA: Diagnosis not present

## 2022-05-13 DIAGNOSIS — N8110 Cystocele, unspecified: Secondary | ICD-10-CM | POA: Diagnosis not present

## 2022-05-13 DIAGNOSIS — N813 Complete uterovaginal prolapse: Secondary | ICD-10-CM | POA: Diagnosis not present

## 2022-05-13 DIAGNOSIS — N814 Uterovaginal prolapse, unspecified: Secondary | ICD-10-CM | POA: Diagnosis not present

## 2022-05-13 DIAGNOSIS — I10 Essential (primary) hypertension: Secondary | ICD-10-CM | POA: Diagnosis not present

## 2022-06-26 ENCOUNTER — Other Ambulatory Visit: Payer: Self-pay | Admitting: Internal Medicine

## 2022-06-26 DIAGNOSIS — Z1231 Encounter for screening mammogram for malignant neoplasm of breast: Secondary | ICD-10-CM

## 2022-08-21 ENCOUNTER — Ambulatory Visit (INDEPENDENT_AMBULATORY_CARE_PROVIDER_SITE_OTHER): Payer: Medicare Other | Admitting: Internal Medicine

## 2022-08-21 ENCOUNTER — Encounter: Payer: Self-pay | Admitting: Internal Medicine

## 2022-08-21 VITALS — BP 152/88 | HR 69 | Temp 98.8°F | Ht 62.0 in | Wt 126.8 lb

## 2022-08-21 DIAGNOSIS — M79644 Pain in right finger(s): Secondary | ICD-10-CM

## 2022-08-21 DIAGNOSIS — I10 Essential (primary) hypertension: Secondary | ICD-10-CM

## 2022-08-21 DIAGNOSIS — R739 Hyperglycemia, unspecified: Secondary | ICD-10-CM | POA: Diagnosis not present

## 2022-08-21 DIAGNOSIS — Z9109 Other allergy status, other than to drugs and biological substances: Secondary | ICD-10-CM

## 2022-08-21 DIAGNOSIS — H34219 Partial retinal artery occlusion, unspecified eye: Secondary | ICD-10-CM | POA: Diagnosis not present

## 2022-08-21 DIAGNOSIS — E7800 Pure hypercholesterolemia, unspecified: Secondary | ICD-10-CM | POA: Diagnosis not present

## 2022-08-21 DIAGNOSIS — Z86010 Personal history of colonic polyps: Secondary | ICD-10-CM | POA: Diagnosis not present

## 2022-08-21 DIAGNOSIS — N812 Incomplete uterovaginal prolapse: Secondary | ICD-10-CM | POA: Diagnosis not present

## 2022-08-21 DIAGNOSIS — F439 Reaction to severe stress, unspecified: Secondary | ICD-10-CM | POA: Diagnosis not present

## 2022-08-21 LAB — HEMOGLOBIN A1C: Hgb A1c MFr Bld: 5.7 % (ref 4.6–6.5)

## 2022-08-21 LAB — CBC WITH DIFFERENTIAL/PLATELET
Basophils Absolute: 0.1 10*3/uL (ref 0.0–0.1)
Basophils Relative: 0.8 % (ref 0.0–3.0)
Eosinophils Absolute: 0.2 10*3/uL (ref 0.0–0.7)
Eosinophils Relative: 2.5 % (ref 0.0–5.0)
HCT: 40.4 % (ref 36.0–46.0)
Hemoglobin: 13.3 g/dL (ref 12.0–15.0)
Lymphocytes Relative: 30.8 % (ref 12.0–46.0)
Lymphs Abs: 2.2 10*3/uL (ref 0.7–4.0)
MCHC: 33 g/dL (ref 30.0–36.0)
MCV: 89.2 fl (ref 78.0–100.0)
Monocytes Absolute: 0.7 10*3/uL (ref 0.1–1.0)
Monocytes Relative: 10.5 % (ref 3.0–12.0)
Neutro Abs: 3.9 10*3/uL (ref 1.4–7.7)
Neutrophils Relative %: 55.4 % (ref 43.0–77.0)
Platelets: 256 10*3/uL (ref 150.0–400.0)
RBC: 4.53 Mil/uL (ref 3.87–5.11)
RDW: 13.3 % (ref 11.5–15.5)
WBC: 7 10*3/uL (ref 4.0–10.5)

## 2022-08-21 LAB — HEPATIC FUNCTION PANEL
ALT: 11 U/L (ref 0–35)
AST: 20 U/L (ref 0–37)
Albumin: 3.9 g/dL (ref 3.5–5.2)
Alkaline Phosphatase: 84 U/L (ref 39–117)
Bilirubin, Direct: 0.1 mg/dL (ref 0.0–0.3)
Total Bilirubin: 0.5 mg/dL (ref 0.2–1.2)
Total Protein: 6.8 g/dL (ref 6.0–8.3)

## 2022-08-21 LAB — LIPID PANEL
Cholesterol: 195 mg/dL (ref 0–200)
HDL: 65.9 mg/dL (ref >39.00)
LDL Cholesterol: 105 mg/dL — ABNORMAL HIGH (ref 0–99)
NonHDL: 128.66
Total CHOL/HDL Ratio: 3
Triglycerides: 118 mg/dL (ref 0.0–149.0)
VLDL: 23.6 mg/dL (ref 0.0–40.0)

## 2022-08-21 LAB — BASIC METABOLIC PANEL
BUN: 18 mg/dL (ref 6–23)
CO2: 26 mEq/L (ref 19–32)
Calcium: 9.4 mg/dL (ref 8.4–10.5)
Chloride: 106 mEq/L (ref 96–112)
Creatinine, Ser: 0.85 mg/dL (ref 0.40–1.20)
GFR: 65.11 mL/min (ref >60.00)
Glucose, Bld: 93 mg/dL (ref 70–99)
Potassium: 4 mEq/L (ref 3.5–5.1)
Sodium: 139 mEq/L (ref 135–145)

## 2022-08-21 LAB — TSH: TSH: 1.14 u[IU]/mL (ref 0.35–5.50)

## 2022-08-21 MED ORDER — AMLODIPINE BESYLATE 5 MG PO TABS: 5.0000 mg | ORAL_TABLET | Freq: Every day | ORAL | 1 refills | Status: DC

## 2022-08-21 NOTE — Progress Notes (Signed)
Subjective:    [name], female    DOB: 11-Sep-1940, 82 y.o.   MRN: [id number]  Patient here for  Chief Complaint  Patient presents with   Medical Management of Chronic Issues    HPI Here to follow up regarding hypercholesterolemia and hypertension.  On amlodipine. States has not taken her blood pressure today.  Feels this is why elevated today. She is s/p sacrospinous ligament fixation, anterior and posterior repair with perineorrhaphy. Last evaluated 08/04/22 - vaginal pain.  Suture removed.  No further pain. Stays active.  No chest pain or sob reported.  No abdominal pain or bowel change reported.  Pain - right thumb and third finger.  Increased pain.  Discussed ortho referral.  Limits activity with her hand.  Uses her hands a lot.  Due colonoscopy.  Was referred to Dr Servando Snare.    Past Medical History:  Diagnosis Date   Allergy    Carpal tunnel syndrome 1999   GERD (gastroesophageal reflux disease)    Hypercholesterolemia    By last lipid panel   Hypertension    Borderline. She does not take the 12.5 HCTZ daily   Past Surgical History:  Procedure Laterality Date   APPENDECTOMY  1963   BREAST CYST ASPIRATION Right 07/12/2020   CARPAL TUNNEL RELEASE     right hand   COLONOSCOPY     COLONOSCOPY WITH PROPOFOL N/A 12/01/2016   Procedure: COLONOSCOPY WITH PROPOFOL;  Surgeon: Midge Minium, MD;  Location: [facility];  Service: Gastroenterology;  Laterality: N/A;   EYE SURGERY     left   POLYPECTOMY  12/01/2016   Procedure: POLYPECTOMY INTESTINAL;  Surgeon: Midge Minium, MD;  Location: [facility];  Service: Gastroenterology;;  Transverse colon polyp x 2 Ascending colon polyp x 3   TONSILLECTOMY     Family History  Problem Relation Age of Onset   Diabetes Mother    Hypertension Brother    Diabetes Maternal Grandmother    Asthma Daughter    Breast cancer Other    Colon cancer Neg Hx    Social History   Socioeconomic History   Marital status:  Married    Spouse name: Not on file   Number of children: 3   Years of education: Not on file   Highest education level: Not on file  Occupational History    Employer: OTHER  Tobacco Use   Smoking status: Never   Smokeless tobacco: Never  Vaping Use   Vaping Use: Never used  Substance and Sexual Activity   Alcohol use: Yes    Alcohol/week: 0.0 standard drinks of alcohol    Comment: 2-3 drinks/month   Drug use: No   Sexual activity: Never  Other Topics Concern   Not on file  Social History Narrative   Recently widowed. Her husband died of a heart attack in May 11, 2015  Social Determinants of Health   Financial Resource Strain: Low Risk  (11/10/2017)   Overall Financial Resource Strain (CARDIA)    Difficulty of Paying Living Expenses: Not hard at all  Food Insecurity: No Food Insecurity (12/11/2021)   Hunger Vital Sign    Worried About Running Out of Food in the Last Year: Never true    Ran Out of Food in the Last Year: Never true  Transportation Needs: No Transportation Needs (12/11/2021)   PRAPARE - Administrator, Civil Service (Medical): No    Lack of Transportation (Non-Medical): No  Physical Activity: Sufficiently Active (  11/10/2017)   Exercise Vital Sign    Days of Exercise per Week: 4 days    Minutes of Exercise per Session: 90 min  Stress: No Stress Concern Present (11/10/2017)   [employer] of Occupational Health - Occupational Stress Questionnaire    Feeling of Stress : Not at all  Social Connections: Not on file     Review of Systems  Constitutional:  Negative for appetite change and unexpected weight change.  HENT:  Negative for congestion and sinus pressure.   Respiratory:  Negative for cough, chest tightness and shortness of breath.   Cardiovascular:  Negative for chest pain and palpitations.  Gastrointestinal:  Negative for abdominal pain, diarrhea, nausea and vomiting.  Genitourinary:  Negative for difficulty urinating and dysuria.   Musculoskeletal:  Negative for myalgias.       Pain - right thumb and third finger.   Skin:  Negative for color change and rash.  Neurological:  Negative for dizziness and headaches.  Psychiatric/Behavioral:  Negative for agitation and dysphoric mood.        Objective:     BP (!) 152/88   Pulse 69   Temp 98.8 F (37.1 C) (Oral)   Ht 5\' 2"  (1.575 m)   Wt 126 lb 12.8 oz (57.5 kg)   LMP 04/28/1992   SpO2 97%   BMI 23.19 kg/m  Wt Readings from Last 3 Encounters:  08/21/22 126 lb 12.8 oz (57.5 kg)  04/05/22 125 lb (56.7 kg)  02/18/22 133 lb 9.6 oz (60.6 kg)    Physical Exam Vitals reviewed.  Constitutional:      General: She is not in acute distress.    Appearance: Normal appearance.  HENT:     Head: Normocephalic and atraumatic.     Right Ear: External ear normal.     Left Ear: External ear normal.  Eyes:     General: No scleral icterus.       Right eye: No discharge.        Left eye: No discharge.     Conjunctiva/sclera: Conjunctivae normal.  Neck:     Thyroid: No thyromegaly.  Cardiovascular:     Rate and Rhythm: Normal rate and regular rhythm.  Pulmonary:     Effort: No respiratory distress.     Breath sounds: Normal breath sounds. No wheezing.  Abdominal:     General: Bowel sounds are normal.     Palpations: Abdomen is soft.     Tenderness: There is no abdominal tenderness.  Musculoskeletal:     Cervical back: Neck supple. No tenderness.     Comments: No lower extremity swelling.  Increased pain - base of thumb.   Lymphadenopathy:     Cervical: No cervical adenopathy.  Skin:    Findings: No erythema or rash.  Neurological:     Mental Status: She is alert.  Psychiatric:        Mood and Affect: Mood normal.        Behavior: Behavior normal.      Outpatient Encounter Medications as of 08/21/2022  Medication Sig   Cholecalciferol (VITAMIN D3) 1000 UNITS CAPS Take 1,000 Units by mouth 2 (two) times daily.    CYANOCOBALAMIN PO Take 1 tablet by mouth  daily.    diphenhydrAMINE HCl, Sleep, (ZZZQUIL PO) Take by mouth. 3 GUMMIES PER NIGHT   fexofenadine (ALLEGRA ALLERGY) 60 MG tablet Take 1 tablet (60 mg total) by mouth 2 (two) times daily.   fluticasone (FLONASE) 50 MCG/ACT nasal spray Place 2  sprays into both nostrils daily.   magnesium 30 MG tablet Take 30 mg by mouth 1 day or 1 dose.   Nutritional Supplements (JOINT FORMULA PO) Take by mouth.   Potassium 95 MG TABS Take by mouth.   [DISCONTINUED] amLODipine (NORVASC) 5 MG tablet Take 1 tablet (5 mg total) by mouth daily.   amLODipine (NORVASC) 5 MG tablet Take 1 tablet (5 mg total) by mouth daily.   [DISCONTINUED] benzonatate (TESSALON) 100 MG capsule Take 2 capsules (200 mg total) by mouth every 8 (eight) hours. (Patient not taking: Reported on 08/21/2022)   [DISCONTINUED] promethazine-dextromethorphan (PROMETHAZINE-DM) 6.25-15 MG/5ML syrup Take 5 mLs by mouth 4 (four) times daily as needed. (Patient not taking: Reported on 08/21/2022)   No facility-administered encounter medications on file as of 08/21/2022.     Lab Results  Component Value Date   WBC 7.0 08/21/2022   HGB 13.3 08/21/2022   HCT 40.4 08/21/2022   PLT 256.0 08/21/2022   GLUCOSE 93 08/21/2022   CHOL 195 08/21/2022   TRIG 118.0 08/21/2022   HDL 65.90 08/21/2022   LDLCALC 105 (H) 08/21/2022   ALT 11 08/21/2022   AST 20 08/21/2022   NA 139 08/21/2022   K 4.0 08/21/2022   CL 106 08/21/2022   CREATININE 0.85 08/21/2022   BUN 18 08/21/2022   CO2 26 08/21/2022   TSH 1.14 08/21/2022   HGBA1C 5.7 08/21/2022    No results found.     Assessment & Plan:  Hypercholesterolemia Assessment & Plan: The 10-year ASCVD risk score (Arnett DK, et al., 2019) is: 45.4%   Values used to calculate the score:     Age: 4 years     Sex: Female     Is Non-Hispanic African American: No     Diabetic: No     Tobacco smoker: No     Systolic Blood Pressure: 160 mmHg     Is BP treated: Yes     HDL Cholesterol: 65.9 mg/dL     Total  Cholesterol: 195 mg/dL  Continue crestor. Follow lipid panel and liver function tests.  Low cholesterol diet and exercise.   Orders: -     CBC with Differential/Platelet -     Hepatic function panel -     Lipid panel -     TSH  Hyperglycemia Assessment & Plan: Low carb diet and exercise.  Follow met b and a1c. Follow.   Orders: -     Hemoglobin A1c  Primary hypertension Assessment & Plan: On amlodipine.  Blood pressure remains elevated.  Increased amlodipine to 5mg  q day previously.  States she has not taken her medication today.  Desires not to increase medication. Follow pressures.  Send in readings. Follow metabolic panel.   Orders: -     Basic metabolic panel  Cystocele with incomplete uterovaginal prolapse Assessment & Plan: 05/13/22 - sacrospinous ligament fixation. Combined anterior and posterior repair with perineorrhaphy. Cystoscopy.  Dr Yehuda Savannah (GYN)   Environmental allergies Assessment & Plan: Controlled.     History of colon polyps Assessment & Plan: Last colonoscopy 2018.  Dr Servando Snare.  Recommended f/u in 5 years. Referral previously placed. Need to f/u on referral.    Hollenhorst plaque Assessment & Plan: Continue crestor.    Stress Assessment & Plan: Handling stress.  Does not feel needs any further intervention.  Follow.    Thumb pain, right Assessment & Plan: Right thumb pain and pain right third finger.  Increased pain.  Uses her hands a lot.  Discussed referral to ortho.   Orders: -     Ambulatory referral to Orthopedic Surgery  Other orders -     amLODIPine Besylate; Take 1 tablet (5 mg total) by mouth daily.  Dispense: 90 tablet; Refill: 1     Dale Woodfin, MD

## 2022-08-22 ENCOUNTER — Other Ambulatory Visit: Payer: Self-pay

## 2022-08-22 DIAGNOSIS — E7800 Pure hypercholesterolemia, unspecified: Secondary | ICD-10-CM

## 2022-08-22 MED ORDER — ROSUVASTATIN CALCIUM 10 MG PO TABS: 10.0000 mg | ORAL_TABLET | Freq: Every day | ORAL | 0 refills | Status: DC

## 2022-08-25 ENCOUNTER — Encounter: Payer: Self-pay | Admitting: Internal Medicine

## 2022-08-25 DIAGNOSIS — M79644 Pain in right finger(s): Secondary | ICD-10-CM | POA: Insufficient documentation

## 2022-08-25 NOTE — Assessment & Plan Note (Signed)
05/13/22 - sacrospinous ligament fixation. Combined anterior and posterior repair with perineorrhaphy. Cystoscopy.  Dr Yehuda Savannah (GYN)

## 2022-08-25 NOTE — Assessment & Plan Note (Signed)
Right thumb pain and pain right third finger.  Increased pain.  Uses her hands a lot.  Discussed referral to ortho.

## 2022-08-25 NOTE — Assessment & Plan Note (Signed)
Handling stress.  Does not feel needs any further intervention.  Follow.   

## 2022-08-25 NOTE — Assessment & Plan Note (Signed)
Low carb diet and exercise.  Follow met b and a1c. Follow.  

## 2022-08-25 NOTE — Assessment & Plan Note (Signed)
Controlled.  

## 2022-08-25 NOTE — Assessment & Plan Note (Signed)
On amlodipine.  Blood pressure remains elevated.  Increased amlodipine to 5mg  q day previously.  States she has not taken her medication today.  Desires not to increase medication. Follow pressures.  Send in readings. Follow metabolic panel.

## 2022-08-25 NOTE — Assessment & Plan Note (Signed)
The 10-year ASCVD risk score (Arnett DK, et al., 2019) is: 45.4%   Values used to calculate the score:     Age: 82 years     Sex: Female     Is Non-Hispanic African American: No     Diabetic: No     Tobacco smoker: No     Systolic Blood Pressure: 160 mmHg     Is BP treated: Yes     HDL Cholesterol: 65.9 mg/dL     Total Cholesterol: 195 mg/dL  Continue crestor. Follow lipid panel and liver function tests.  Low cholesterol diet and exercise.

## 2022-08-25 NOTE — Assessment & Plan Note (Signed)
Last colonoscopy 2018.  Dr Servando Snare.  Recommended f/u in 5 years. Referral previously placed. Need to f/u on referral.

## 2022-08-25 NOTE — Assessment & Plan Note (Signed)
Continue crestor 

## 2022-09-25 ENCOUNTER — Ambulatory Visit
Admission: RE | Admit: 2022-09-25 | Discharge: 2022-09-25 | Disposition: A | Discharge status: Home / Self Care | Payer: Medicare Other | Source: Ambulatory Visit | Attending: Internal Medicine | Admitting: Internal Medicine

## 2022-09-25 DIAGNOSIS — Z1231 Encounter for screening mammogram for malignant neoplasm of breast: Secondary | ICD-10-CM | POA: Diagnosis not present

## 2022-09-30 ENCOUNTER — Other Ambulatory Visit (INDEPENDENT_AMBULATORY_CARE_PROVIDER_SITE_OTHER): Payer: Medicare Other

## 2022-09-30 DIAGNOSIS — E7800 Pure hypercholesterolemia, unspecified: Secondary | ICD-10-CM | POA: Diagnosis not present

## 2022-09-30 LAB — HEPATIC FUNCTION PANEL
ALT: 12 U/L (ref 0–35)
AST: 21 U/L (ref 0–37)
Albumin: 4 g/dL (ref 3.5–5.2)
Alkaline Phosphatase: 83 U/L (ref 39–117)
Bilirubin, Direct: 0.1 mg/dL (ref 0.0–0.3)
Total Bilirubin: 0.6 mg/dL (ref 0.2–1.2)
Total Protein: 6.8 g/dL (ref 6.0–8.3)

## 2022-10-01 DIAGNOSIS — Z08 Encounter for follow-up examination after completed treatment for malignant neoplasm: Secondary | ICD-10-CM | POA: Diagnosis not present

## 2022-10-01 DIAGNOSIS — Z85828 Personal history of other malignant neoplasm of skin: Secondary | ICD-10-CM | POA: Diagnosis not present

## 2022-10-01 DIAGNOSIS — D2271 Melanocytic nevi of right lower limb, including hip: Secondary | ICD-10-CM | POA: Diagnosis not present

## 2022-10-01 DIAGNOSIS — D225 Melanocytic nevi of trunk: Secondary | ICD-10-CM | POA: Diagnosis not present

## 2022-10-01 DIAGNOSIS — D2272 Melanocytic nevi of left lower limb, including hip: Secondary | ICD-10-CM | POA: Diagnosis not present

## 2022-10-01 DIAGNOSIS — L821 Other seborrheic keratosis: Secondary | ICD-10-CM | POA: Diagnosis not present

## 2022-10-01 DIAGNOSIS — D485 Neoplasm of uncertain behavior of skin: Secondary | ICD-10-CM | POA: Diagnosis not present

## 2022-10-01 DIAGNOSIS — D2262 Melanocytic nevi of left upper limb, including shoulder: Secondary | ICD-10-CM | POA: Diagnosis not present

## 2022-10-01 DIAGNOSIS — C44519 Basal cell carcinoma of skin of other part of trunk: Secondary | ICD-10-CM | POA: Diagnosis not present

## 2022-10-01 DIAGNOSIS — D2261 Melanocytic nevi of right upper limb, including shoulder: Secondary | ICD-10-CM | POA: Diagnosis not present

## 2022-10-02 ENCOUNTER — Ambulatory Visit (INDEPENDENT_AMBULATORY_CARE_PROVIDER_SITE_OTHER): Payer: Medicare Other | Admitting: Internal Medicine

## 2022-10-02 DIAGNOSIS — I10 Essential (primary) hypertension: Secondary | ICD-10-CM

## 2022-10-02 NOTE — Progress Notes (Deleted)
Subjective:    [name], female    DOB: [birth year], 82 y.o.   MRN: [id number]  Patient here for No chief complaint on file.   HPI Here to follow up regarding hypercholesterolemia and hypertension. On amlodipine.    Past Medical History:  Diagnosis Date   Allergy    Carpal tunnel syndrome 1999   GERD (gastroesophageal reflux disease)    Hypercholesterolemia    By last lipid panel   Hypertension    Borderline. She does not take the 12.5 HCTZ daily   Past Surgical History:  Procedure Laterality Date   APPENDECTOMY  1963   BREAST CYST ASPIRATION Right 07/12/2020   CARPAL TUNNEL RELEASE     right hand   COLONOSCOPY     COLONOSCOPY WITH PROPOFOL N/A 12/01/2016   Procedure: COLONOSCOPY WITH PROPOFOL;  Surgeon: Midge Minium, MD;  Location: [facility];  Service: Gastroenterology;  Laterality: N/A;   EYE SURGERY     left   POLYPECTOMY  12/01/2016   Procedure: POLYPECTOMY INTESTINAL;  Surgeon: Midge Minium, MD;  Location: [facility];  Service: Gastroenterology;;  Transverse colon polyp x 2 Ascending colon polyp x 3   TONSILLECTOMY     Family History  Problem Relation Age of Onset   Diabetes Mother    Hypertension Brother    Diabetes Maternal Grandmother    Asthma Daughter    Breast cancer Other    Colon cancer Neg Hx    Social History   Socioeconomic History   Marital status: Married    Spouse name: Not on file   Number of children: 3   Years of education: Not on file   Highest education level: Not on file  Occupational History    Employer: OTHER  Tobacco Use   Smoking status: Never   Smokeless tobacco: Never  Vaping Use   Vaping Use: Never used  Substance and Sexual Activity   Alcohol use: Yes    Alcohol/week: 0.0 standard drinks of alcohol    Comment: 2-3 drinks/month   Drug use: No   Sexual activity: Never  Other Topics Concern   Not on file  Social History Narrative   Recently widowed. Her husband died of a heart attack in  04-19-15  Social Determinants of Health   Financial Resource Strain: Low Risk  (11/10/2017)   Overall Financial Resource Strain (CARDIA)    Difficulty of Paying Living Expenses: Not hard at all  Food Insecurity: No Food Insecurity (12/11/2021)   Hunger Vital Sign    Worried About Running Out of Food in the Last Year: Never true    Ran Out of Food in the Last Year: Never true  Transportation Needs: No Transportation Needs (12/11/2021)   PRAPARE - Administrator, Civil Service (Medical): No    Lack of Transportation (Non-Medical): No  Physical Activity: Sufficiently Active (11/10/2017)   Exercise Vital Sign    Days of Exercise per Week: 4 days    Minutes of Exercise per Session: 90 min  Stress: No Stress Concern Present (11/10/2017)   [employer] of Occupational Health - Occupational Stress Questionnaire    Feeling of Stress : Not at all  Social Connections: Not on file     Review of Systems     Objective:     LMP 04/28/1992  Wt Readings from Last 3 Encounters:  08/21/22 126 lb 12.8 oz (57.5 kg)  04/05/22 125 lb (56.7 kg)  02/18/22 133 lb 9.6  oz (60.6 kg)    Physical Exam   Outpatient Encounter Medications as of 10/02/2022  Medication Sig   amLODipine (NORVASC) 5 MG tablet Take 1 tablet (5 mg total) by mouth daily.   Cholecalciferol (VITAMIN D3) 1000 UNITS CAPS Take 1,000 Units by mouth 2 (two) times daily.    CYANOCOBALAMIN PO Take 1 tablet by mouth daily.    diphenhydrAMINE HCl, Sleep, (ZZZQUIL PO) Take by mouth. 3 GUMMIES PER NIGHT   fexofenadine (ALLEGRA ALLERGY) 60 MG tablet Take 1 tablet (60 mg total) by mouth 2 (two) times daily.   fluticasone (FLONASE) 50 MCG/ACT nasal spray Place 2 sprays into both nostrils daily.   magnesium 30 MG tablet Take 30 mg by mouth 1 day or 1 dose.   Nutritional Supplements (JOINT FORMULA PO) Take by mouth.   Potassium 95 MG TABS Take by mouth.   rosuvastatin (CRESTOR) 10 MG tablet Take 1 tablet (10 mg total) by mouth  daily.   No facility-administered encounter medications on file as of 10/02/2022.     Lab Results  Component Value Date   WBC 7.0 08/21/2022   HGB 13.3 08/21/2022   HCT 40.4 08/21/2022   PLT 256.0 08/21/2022   GLUCOSE 93 08/21/2022   CHOL 195 08/21/2022   TRIG 118.0 08/21/2022   HDL 65.90 08/21/2022   LDLCALC 105 (H) 08/21/2022   ALT 12 09/30/2022   AST 21 09/30/2022   NA 139 08/21/2022   K 4.0 08/21/2022   CL 106 08/21/2022   CREATININE 0.85 08/21/2022   BUN 18 08/21/2022   CO2 26 08/21/2022   TSH 1.14 08/21/2022   HGBA1C 5.7 08/21/2022    MM 3D SCREENING MAMMOGRAM BILATERAL BREAST  Result Date: 09/29/2022 CLINICAL DATA:  Screening. EXAM: DIGITAL SCREENING BILATERAL MAMMOGRAM WITH TOMOSYNTHESIS AND CAD TECHNIQUE: Bilateral screening digital craniocaudal and mediolateral oblique mammograms were obtained. Bilateral screening digital breast tomosynthesis was performed. The images were evaluated with computer-aided detection. COMPARISON:  Previous exam(s). ACR Breast Density Category b: There are scattered areas of fibroglandular density. FINDINGS: There are no findings suspicious for malignancy. IMPRESSION: No mammographic evidence of malignancy. A result letter of this screening mammogram will be mailed directly to the patient. RECOMMENDATION: Screening mammogram in one year. (Code:SM-B-01Y) BI-RADS CATEGORY  1: Negative. Electronically Signed   By: Baird Lyons M.D.   On: 09/29/2022 12:56       Assessment & Plan:  There are no diagnoses linked to this encounter.   Dale Whitney, MD

## 2022-10-05 ENCOUNTER — Encounter: Payer: Self-pay | Admitting: Internal Medicine

## 2022-10-05 NOTE — Progress Notes (Signed)
[name], female   DOB: 1940/10/14, 82 y.o.   MRN: [id number] Pt did not show for appt.

## 2022-10-29 DIAGNOSIS — C44519 Basal cell carcinoma of skin of other part of trunk: Secondary | ICD-10-CM | POA: Diagnosis not present

## 2022-12-12 ENCOUNTER — Emergency Department: Payer: Medicare Other

## 2022-12-12 ENCOUNTER — Emergency Department
Admission: EM | Admit: 2022-12-12 | Discharge: 2022-12-12 | Disposition: A | Discharge status: Home / Self Care | Payer: Medicare Other | Attending: Student in an Organized Health Care Education/Training Program | Admitting: Student in an Organized Health Care Education/Training Program

## 2022-12-12 ENCOUNTER — Other Ambulatory Visit: Payer: Self-pay

## 2022-12-12 DIAGNOSIS — I10 Essential (primary) hypertension: Secondary | ICD-10-CM | POA: Diagnosis not present

## 2022-12-12 DIAGNOSIS — R1032 Left lower quadrant pain: Secondary | ICD-10-CM | POA: Diagnosis not present

## 2022-12-12 DIAGNOSIS — R42 Dizziness and giddiness: Secondary | ICD-10-CM | POA: Diagnosis not present

## 2022-12-12 DIAGNOSIS — K5900 Constipation, unspecified: Secondary | ICD-10-CM | POA: Diagnosis not present

## 2022-12-12 DIAGNOSIS — K529 Noninfective gastroenteritis and colitis, unspecified: Secondary | ICD-10-CM | POA: Diagnosis not present

## 2022-12-12 DIAGNOSIS — K429 Umbilical hernia without obstruction or gangrene: Secondary | ICD-10-CM | POA: Diagnosis not present

## 2022-12-12 DIAGNOSIS — R109 Unspecified abdominal pain: Secondary | ICD-10-CM | POA: Diagnosis not present

## 2022-12-12 LAB — URINALYSIS, ROUTINE W REFLEX MICROSCOPIC
Bacteria, UA: NONE SEEN
Bilirubin Urine: NEGATIVE
Glucose, UA: NEGATIVE mg/dL
Ketones, ur: 5 mg/dL — AB
Leukocytes,Ua: NEGATIVE
Nitrite: POSITIVE — AB
Protein, ur: NEGATIVE mg/dL
Specific Gravity, Urine: 1.035 — ABNORMAL HIGH (ref 1.005–1.030)
pH: 5 (ref 5.0–8.0)

## 2022-12-12 LAB — LACTIC ACID, PLASMA: Lactic Acid, Venous: 1 mmol/L (ref 0.5–1.9)

## 2022-12-12 LAB — CBC WITH DIFFERENTIAL/PLATELET
Abs Immature Granulocytes: 0.06 10*3/uL (ref 0.00–0.07)
Basophils Absolute: 0.1 10*3/uL (ref 0.0–0.1)
Basophils Relative: 0 %
Eosinophils Absolute: 0 10*3/uL (ref 0.0–0.5)
Eosinophils Relative: 0 %
HCT: 43.4 % (ref 36.0–46.0)
Hemoglobin: 14.3 g/dL (ref 12.0–15.0)
Immature Granulocytes: 0 %
Lymphocytes Relative: 4 %
Lymphs Abs: 0.7 10*3/uL (ref 0.7–4.0)
MCH: 29 pg (ref 26.0–34.0)
MCHC: 32.9 g/dL (ref 30.0–36.0)
MCV: 88 fL (ref 80.0–100.0)
Monocytes Absolute: 0.9 10*3/uL (ref 0.1–1.0)
Monocytes Relative: 6 %
Neutro Abs: 14.6 10*3/uL — ABNORMAL HIGH (ref 1.7–7.7)
Neutrophils Relative %: 90 %
Platelets: 222 10*3/uL (ref 150–400)
RBC: 4.93 MIL/uL (ref 3.87–5.11)
RDW: 12.9 % (ref 11.5–15.5)
WBC: 16.4 10*3/uL — ABNORMAL HIGH (ref 4.0–10.5)
nRBC: 0 % (ref 0.0–0.2)

## 2022-12-12 LAB — COMPREHENSIVE METABOLIC PANEL
ALT: 19 U/L (ref 0–44)
AST: 28 U/L (ref 15–41)
Albumin: 4.2 g/dL (ref 3.5–5.0)
Alkaline Phosphatase: 93 U/L (ref 38–126)
Anion gap: 10 (ref 5–15)
BUN: 20 mg/dL (ref 8–23)
CO2: 25 mmol/L (ref 22–32)
Calcium: 9.2 mg/dL (ref 8.9–10.3)
Chloride: 103 mmol/L (ref 98–111)
Creatinine, Ser: 0.92 mg/dL (ref 0.44–1.00)
GFR, Estimated: >60 mL/min (ref >60)
Glucose, Bld: 143 mg/dL — ABNORMAL HIGH (ref 70–99)
Potassium: 3.8 mmol/L (ref 3.5–5.1)
Sodium: 138 mmol/L (ref 135–145)
Total Bilirubin: 1 mg/dL (ref 0.3–1.2)
Total Protein: 7.4 g/dL (ref 6.5–8.1)

## 2022-12-12 LAB — LIPASE, BLOOD: Lipase: 28 U/L (ref 11–51)

## 2022-12-12 MED ORDER — CIPROFLOXACIN HCL 500 MG PO TABS
250.0000 mg | ORAL_TABLET | Freq: Once | ORAL | Status: AC
  Administered 2022-12-12: 250 mg via ORAL
  Filled 2022-12-12: qty 1

## 2022-12-12 MED ORDER — IOHEXOL 300 MG/ML  SOLN
75.0000 mL | Freq: Once | INTRAMUSCULAR | Status: AC | PRN
  Administered 2022-12-12: 75 mL via INTRAVENOUS

## 2022-12-12 MED ORDER — METRONIDAZOLE 500 MG PO TABS: 500.0000 mg | ORAL_TABLET | Freq: Two times a day (BID) | ORAL | 0 refills | Status: AC

## 2022-12-12 MED ORDER — METRONIDAZOLE 500 MG PO TABS
500.0000 mg | ORAL_TABLET | Freq: Once | ORAL | Status: AC
  Administered 2022-12-12: 500 mg via ORAL
  Filled 2022-12-12: qty 1

## 2022-12-12 MED ORDER — CIPROFLOXACIN HCL 500 MG PO TABS: 500.0000 mg | ORAL_TABLET | Freq: Two times a day (BID) | ORAL | 0 refills | Status: AC

## 2022-12-12 NOTE — ED Provider Notes (Signed)
[facility] Provider Note    Event Date/Time   First MD Initiated Contact with Patient 12/12/22 1120     (approximate)   History   Constipation   HPI  Donna Lara is a 82 y.o. female history of GERD and hypertension presents to the ER for evaluation of abdominal pain felt like it was related to constipation.  Primarily having lower abdominal pain.  No radiation.  Took a laxative did move her bowels this morning in the ER and felt some better but still having some discomfort.     Physical Exam   Triage Vital Signs: ED Triage Vitals  Encounter Vitals Group     BP 12/12/22 1103 119/66     Systolic BP Percentile --      Diastolic BP Percentile --      Pulse Rate 12/12/22 1102 62     Resp 12/12/22 1102 18     Temp 12/12/22 1103 (!) 97.4 F (36.3 C)     Temp Source 12/12/22 1102 Oral     SpO2 12/12/22 1102 97 %     Weight 12/12/22 1104 126 lb 12.2 oz (57.5 kg)     Height 12/12/22 1104 5\' 2"  (1.575 m)     Head Circumference --      Peak Flow --      Pain Score 12/12/22 1103 9     Pain Loc --      Pain Education --      Exclude from Growth Chart --     Most recent vital signs: Vitals:   12/12/22 1430 12/12/22 1518  BP: 119/65   Pulse: 81   Resp: 13   Temp:  98.2 F (36.8 C)  SpO2: 97%      Constitutional: Alert  Eyes: Conjunctivae are normal.  Head: Atraumatic. Nose: No congestion/rhinnorhea. Mouth/Throat: Mucous membranes are moist.   Neck: Painless ROM.  Cardiovascular:   Good peripheral circulation. Respiratory: Normal respiratory effort.  No retractions.  Gastrointestinal: Soft with mild tenderness to palpation.  No guarding or rebound. Musculoskeletal:  no deformity Neurologic:  MAE spontaneously. No gross focal neurologic deficits are appreciated.  Skin:  Skin is warm, dry and intact. No rash noted. Psychiatric: Mood and affect are normal. Speech and behavior are normal.    ED Results / Procedures / Treatments   Labs (all  labs ordered are listed, but only abnormal results are displayed) Labs Reviewed  CBC WITH DIFFERENTIAL/PLATELET - Abnormal; Notable for the following components:      Result Value   WBC 16.4 (*)    Neutro Abs 14.6 (*)    All other components within normal limits  COMPREHENSIVE METABOLIC PANEL - Abnormal; Notable for the following components:   Glucose, Bld 143 (*)    All other components within normal limits  URINALYSIS, ROUTINE W REFLEX MICROSCOPIC - Abnormal; Notable for the following components:   Color, Urine AMBER (*)    APPearance CLEAR (*)    Specific Gravity, Urine 1.035 (*)    Hgb urine dipstick MODERATE (*)    Ketones, ur 5 (*)    Nitrite POSITIVE (*)    All other components within normal limits  LIPASE, BLOOD  LACTIC ACID, PLASMA     EKG  ED ECG REPORT I, Willy Eddy, the attending physician, personally viewed and interpreted this ECG.   Date: 12/12/2022  EKG Time: 11:23  Rate: 65  Rhythm: sinus  Axis: normal  Intervals: normal  ST&T Change: no stemi, no  depressions    RADIOLOGY Please see ED Course for my review and interpretation.  I personally reviewed all radiographic images ordered to evaluate for the above acute complaints and reviewed radiology reports and findings.  These findings were personally discussed with the patient.  Please see medical record for radiology report.    PROCEDURES:  Critical Care performed: No  Procedures   MEDICATIONS ORDERED IN ED: Medications  iohexol (OMNIPAQUE) 300 MG/ML solution 75 mL (75 mLs Intravenous Contrast Given 12/12/22 1216)  ciprofloxacin (CIPRO) tablet 250 mg (250 mg Oral Given 12/12/22 1444)  metroNIDAZOLE (FLAGYL) tablet 500 mg (500 mg Oral Given 12/12/22 1444)     IMPRESSION / MDM / ASSESSMENT AND PLAN / ED COURSE  I reviewed the triage vital signs and the nursing notes.                              Differential diagnosis includes, but is not limited to, colitis, constipation, SBO, mass,  diverticulitis, perforation, dehydration, electrolyte abnormality  Patient presenting to the ER for evaluation of symptoms as described above.  Based on symptoms, risk factors and considered above differential, this presenting complaint could reflect a potentially life-threatening illness therefore the patient will be placed on continuous pulse oximetry and telemetry for monitoring.  Laboratory evaluation will be sent to evaluate for the above complaints.      Clinical Course as of 12/12/22 1521  Fri Dec 12, 2022  1156 X-ray on my review and interpretation without evidence of significant stool burden.  Patient with leukocytosis.  Given persistent abdominal discomfort will order CT imaging to further evaluate. [PR]  1254 CT imaging on my review and interpretation without evidence of obstruction. [PR]  1408 Urinalysis nitrite positive but no bacteria no white cells no leukocytes.  CT imaging would likely colitis.  Will check lactate to further evaluate for possible mesenteric ischemia though have a lower suspicion for this. [PR]  1507 Lactate is normal.  Patient feeling well.  This is not consistent with mesenteric ischemia.  She is tolerating p.o.  Does appear reasonable for trial of outpatient management.  Family agreeable with plan. [PR]    Clinical Course User Index [PR] Willy Eddy, MD     FINAL CLINICAL IMPRESSION(S) / ED DIAGNOSES   Final diagnoses:  Colitis  Left lower quadrant abdominal pain     Rx / DC Orders   ED Discharge Orders          Ordered    ciprofloxacin (CIPRO) 500 MG tablet  2 times daily        12/12/22 1515    metroNIDAZOLE (FLAGYL) 500 MG tablet  2 times daily        12/12/22 1515             Note:  This document was prepared using Dragon voice recognition software and may include unintentional dictation errors.    Willy Eddy, MD 12/12/22 [id number]

## 2022-12-12 NOTE — ED Notes (Signed)
First nurse note: Pt here via [facility] with c/o constipation.   146/75 98%  97.5 HR: 64

## 2022-12-12 NOTE — ED Triage Notes (Signed)
Pt here with constipation. Pt states she just had a large bowel movement in the ED lobby. Pt states her last bowel movement has been more than a week ago. Pt states she feels a little better after using the bathroom.

## 2022-12-15 NOTE — Group Note (Deleted)

## 2022-12-16 IMAGING — US US BREAST*R* LIMITED INC AXILLA
1 series · 6 of 6 positions shown · non-contrast
Comparison: Previous exam(s).

CLINICAL DATA: Six-month follow-up for a probable cyst which was
aspirated on 07/13/2019.

EXAM:
ULTRASOUND OF THE RIGHT BREAST

[Series 1: us breast*right* limited inc axilla · 0.06mm/px · 6 of 6 slices shown]
[im 1/6]
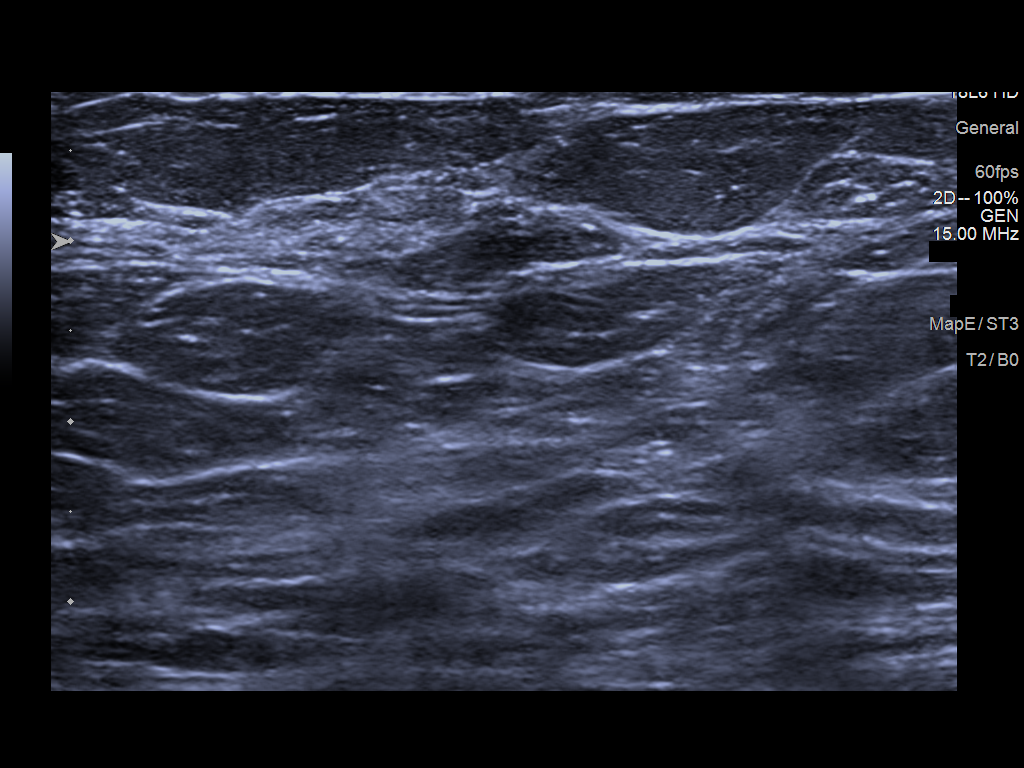
[im 2/6]
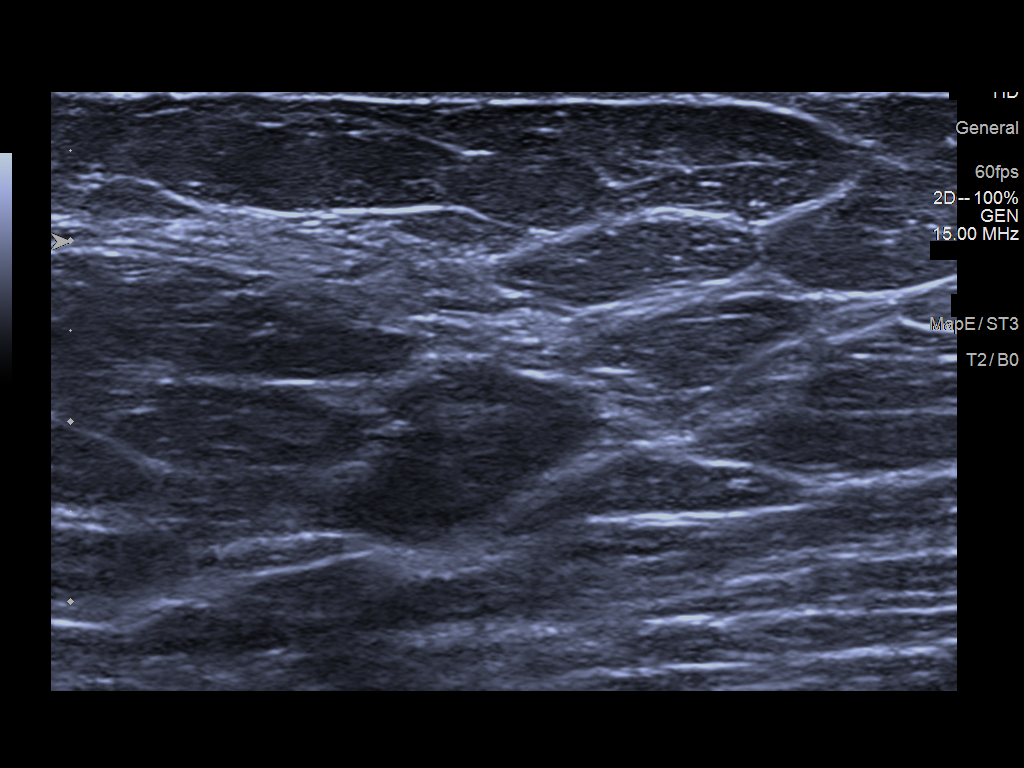
[im 3/6]
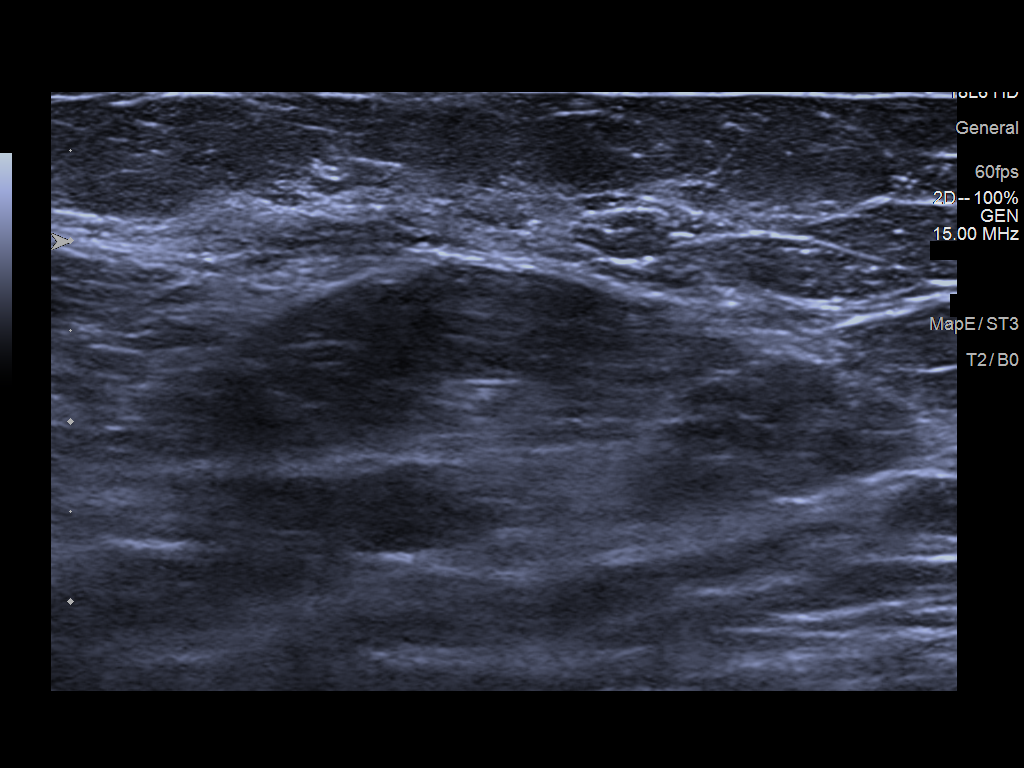
[im 4/6]
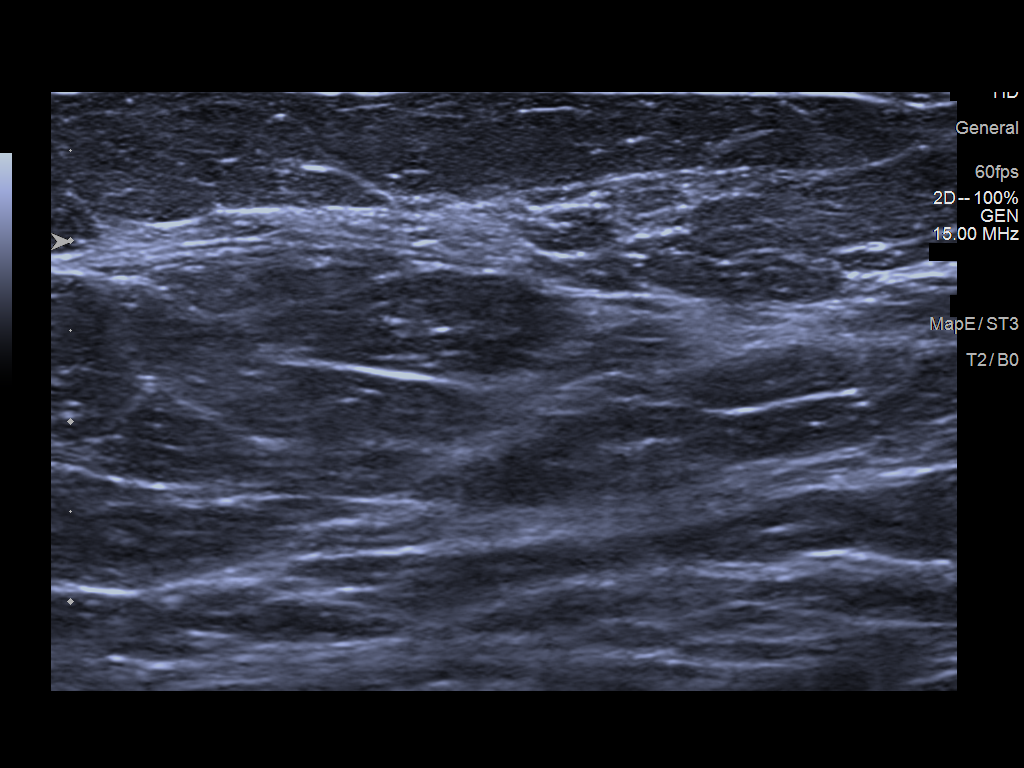
[im 5/6]
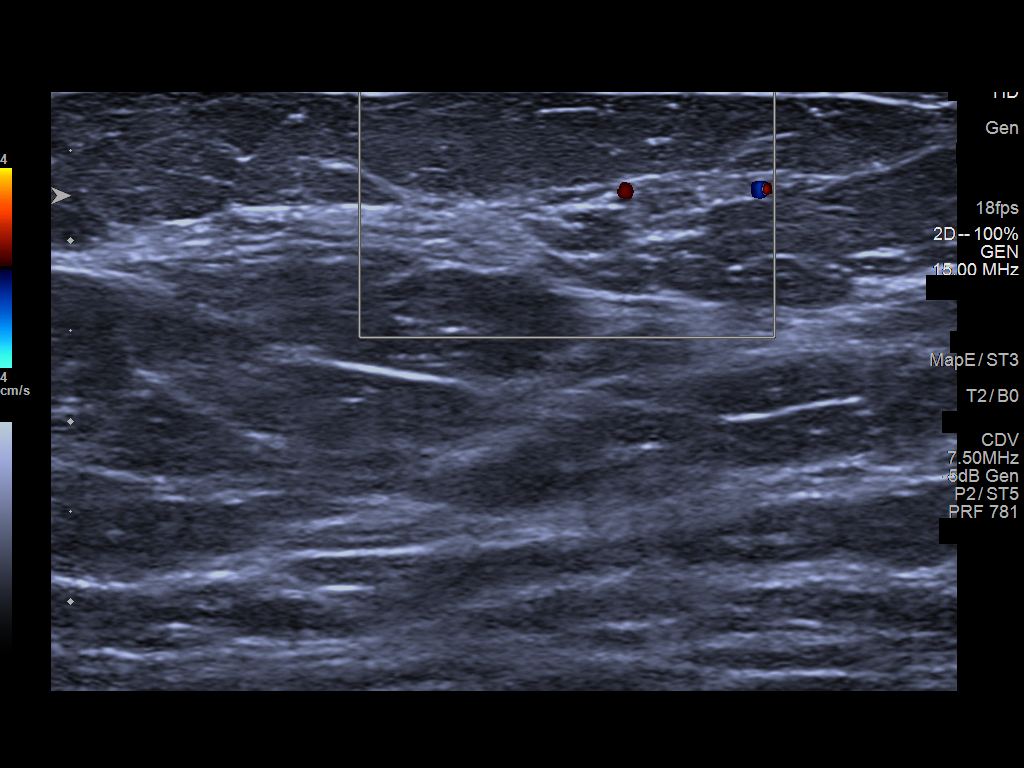
[im 6/6]
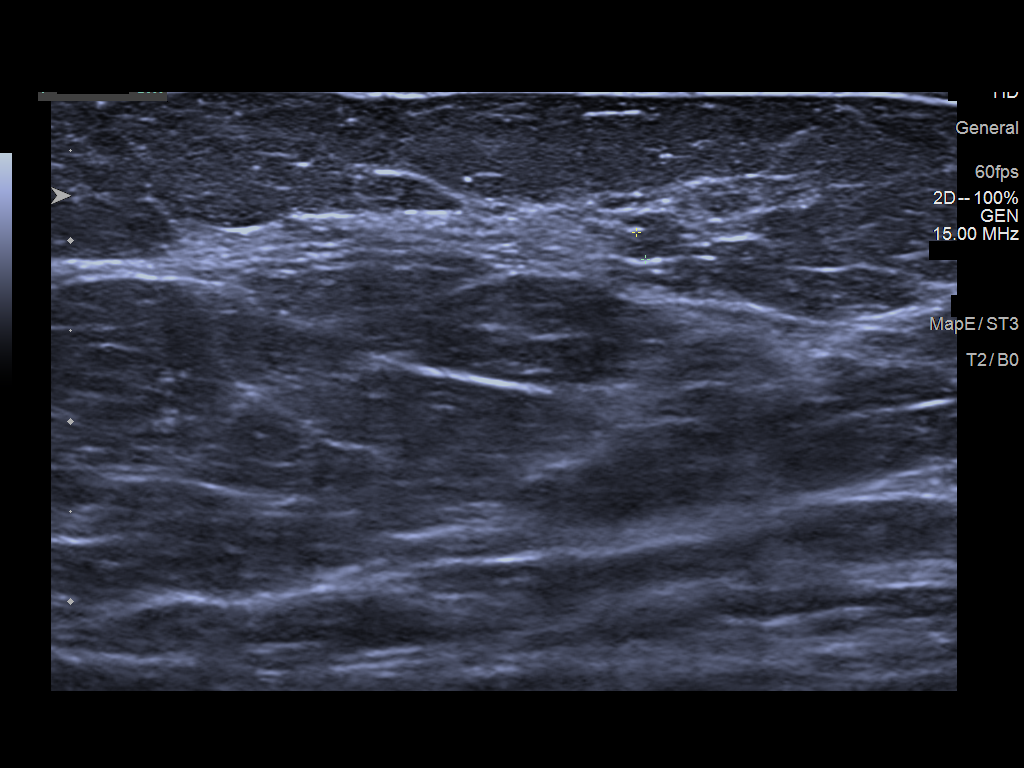

[6 of 6 positions shown; findings below may reference images not displayed]

FINDINGS: Ultrasound targeted to the retroareolar right breast at 11 o'clock
demonstrates normal fibroglandular tissue. The cyst previously seen
has not reaccumulated.
IMPRESSION: Resolution of the retroareolar right breast cyst at 11 o'clock.

RECOMMENDATION:
Return to routine screening mammography is recommended. The patient
will be due for screening in Friday June, 2020.

I have discussed the findings and recommendations with the patient.
If applicable, a reminder letter will be sent to the patient
regarding the next appointment.

BI-RADS CATEGORY  1: Negative.

## 2022-12-19 ENCOUNTER — Telehealth: Payer: Self-pay

## 2022-12-19 NOTE — Telephone Encounter (Signed)
Patient states she recently had an ED visit and they put her on two medications for an infection in her bowels.  Patient states she only has two pills left of the medications they gave her.  Patient states she is not having symptoms at this time.  I scheduled an appointment for patient to see Dr. Dale Orderville on 12/25/2022.

## 2022-12-19 NOTE — Telephone Encounter (Signed)
Noted, will recheck urine at visit on 9/19

## 2022-12-25 ENCOUNTER — Encounter: Payer: Self-pay | Admitting: Internal Medicine

## 2022-12-25 ENCOUNTER — Ambulatory Visit (INDEPENDENT_AMBULATORY_CARE_PROVIDER_SITE_OTHER): Payer: Medicare Other | Admitting: Internal Medicine

## 2022-12-25 VITALS — BP 134/72 | HR 79 | Temp 97.4°F | Ht 60.0 in | Wt 126.4 lb

## 2022-12-25 DIAGNOSIS — R739 Hyperglycemia, unspecified: Secondary | ICD-10-CM | POA: Diagnosis not present

## 2022-12-25 DIAGNOSIS — H34219 Partial retinal artery occlusion, unspecified eye: Secondary | ICD-10-CM

## 2022-12-25 DIAGNOSIS — I10 Essential (primary) hypertension: Secondary | ICD-10-CM | POA: Diagnosis not present

## 2022-12-25 DIAGNOSIS — E7800 Pure hypercholesterolemia, unspecified: Secondary | ICD-10-CM

## 2022-12-25 DIAGNOSIS — K529 Noninfective gastroenteritis and colitis, unspecified: Secondary | ICD-10-CM

## 2022-12-25 DIAGNOSIS — R935 Abnormal findings on diagnostic imaging of other abdominal regions, including retroperitoneum: Secondary | ICD-10-CM

## 2022-12-25 DIAGNOSIS — F439 Reaction to severe stress, unspecified: Secondary | ICD-10-CM

## 2022-12-25 LAB — CBC WITH DIFFERENTIAL/PLATELET
Basophils Absolute: 0 10*3/uL (ref 0.0–0.1)
Basophils Relative: 0.5 % (ref 0.0–3.0)
Eosinophils Absolute: 0.1 10*3/uL (ref 0.0–0.7)
Eosinophils Relative: 1.5 % (ref 0.0–5.0)
HCT: 41.9 % (ref 36.0–46.0)
Hemoglobin: 13.4 g/dL (ref 12.0–15.0)
Lymphocytes Relative: 24.9 % (ref 12.0–46.0)
Lymphs Abs: 2 10*3/uL (ref 0.7–4.0)
MCHC: 32.1 g/dL (ref 30.0–36.0)
MCV: 89 fl (ref 78.0–100.0)
Monocytes Absolute: 0.9 10*3/uL (ref 0.1–1.0)
Monocytes Relative: 11.3 % (ref 3.0–12.0)
Neutro Abs: 4.9 10*3/uL (ref 1.4–7.7)
Neutrophils Relative %: 61.8 % (ref 43.0–77.0)
Platelets: 265 10*3/uL (ref 150.0–400.0)
RBC: 4.71 Mil/uL (ref 3.87–5.11)
RDW: 13.1 % (ref 11.5–15.5)
WBC: 8 10*3/uL (ref 4.0–10.5)

## 2022-12-25 LAB — BASIC METABOLIC PANEL
BUN: 18 mg/dL (ref 6–23)
CO2: 27 mEq/L (ref 19–32)
Calcium: 9.7 mg/dL (ref 8.4–10.5)
Chloride: 105 mEq/L (ref 96–112)
Creatinine, Ser: 0.85 mg/dL (ref 0.40–1.20)
GFR: 64.96 mL/min (ref >60.00)
Glucose, Bld: 90 mg/dL (ref 70–99)
Potassium: 4.1 mEq/L (ref 3.5–5.1)
Sodium: 139 mEq/L (ref 135–145)

## 2022-12-25 LAB — LIPID PANEL
Cholesterol: 142 mg/dL (ref 0–200)
HDL: 69.8 mg/dL (ref >39.00)
LDL Cholesterol: 58 mg/dL (ref 0–99)
NonHDL: 72.31
Total CHOL/HDL Ratio: 2
Triglycerides: 71 mg/dL (ref 0.0–149.0)
VLDL: 14.2 mg/dL (ref 0.0–40.0)

## 2022-12-25 LAB — HEMOGLOBIN A1C: Hgb A1c MFr Bld: 6 % (ref 4.6–6.5)

## 2022-12-25 LAB — HEPATIC FUNCTION PANEL
ALT: 23 U/L (ref 0–35)
AST: 25 U/L (ref 0–37)
Albumin: 4.2 g/dL (ref 3.5–5.2)
Alkaline Phosphatase: 81 U/L (ref 39–117)
Bilirubin, Direct: 0.1 mg/dL (ref 0.0–0.3)
Total Bilirubin: 0.7 mg/dL (ref 0.2–1.2)
Total Protein: 7.2 g/dL (ref 6.0–8.3)

## 2022-12-25 MED ORDER — SERTRALINE HCL 25 MG PO TABS: 25.0000 mg | ORAL_TABLET | Freq: Every day | ORAL | 2 refills | Status: DC

## 2022-12-25 NOTE — Progress Notes (Signed)
Subjective:    [name], female    DOB: [birth year], 82 y.o.   MRN: [id number]  Patient here for  Chief Complaint  Patient presents with   Hospitalization Follow-up    HPI Here for ER follow up. Evaluated 12/12/22 - abdominal pain. CT - findings favored acute colitis. Lactate wnl.  Treated with cipro and flagyl.  There was an approximately 1.0 x 1.4 cm oval hypoattenuating area in the upper anterior uterine body, likely leiomyoma. No large adnexal mass. Discussed.  Agreeable to pelvic ultrasound for further evaluation.  She is eating.  No nausea or vomiting.  No abdominal pain curretly.  Overall feeling better.  No chest pain or sob.  Handling stress.    Past Medical History:  Diagnosis Date   Allergy    Carpal tunnel syndrome 1999   GERD (gastroesophageal reflux disease)    Hypercholesterolemia    By last lipid panel   Hypertension    Borderline. She does not take the 12.5 HCTZ daily   Past Surgical History:  Procedure Laterality Date   APPENDECTOMY  1963   BREAST CYST ASPIRATION Right 07/12/2020   CARPAL TUNNEL RELEASE     right hand   COLONOSCOPY     COLONOSCOPY WITH PROPOFOL N/A 12/01/2016   Procedure: COLONOSCOPY WITH PROPOFOL;  Surgeon: Midge Minium, MD;  Location: [facility];  Service: Gastroenterology;  Laterality: N/A;   EYE SURGERY     left   POLYPECTOMY  12/01/2016   Procedure: POLYPECTOMY INTESTINAL;  Surgeon: Midge Minium, MD;  Location: [facility];  Service: Gastroenterology;;  Transverse colon polyp x 2 Ascending colon polyp x 3   TONSILLECTOMY     Family History  Problem Relation Age of Onset   Diabetes Mother    Hypertension Brother    Diabetes Maternal Grandmother    Asthma Daughter    Breast cancer Other    Colon cancer Neg Hx    Social History   Socioeconomic History   Marital status: Married    Spouse name: Not on file   Number of children: 3   Years of education: Not on file   Highest education level: Not on  file  Occupational History    Employer: OTHER  Tobacco Use   Smoking status: Never   Smokeless tobacco: Never  Vaping Use   Vaping status: Never Used  Substance and Sexual Activity   Alcohol use: Yes    Alcohol/week: 0.0 standard drinks of alcohol    Comment: 2-3 drinks/month   Drug use: No   Sexual activity: Never  Other Topics Concern   Not on file  Social History Narrative   Recently widowed. Her husband died of a heart attack in 04/28/2015  Social Determinants of Health   Financial Resource Strain: Low Risk  (11/10/2017)   Overall Financial Resource Strain (CARDIA)    Difficulty of Paying Living Expenses: Not hard at all  Food Insecurity: No Food Insecurity (12/11/2021)   Hunger Vital Sign    Worried About Running Out of Food in the Last Year: Never true    Ran Out of Food in the Last Year: Never true  Transportation Needs: No Transportation Needs (12/11/2021)   PRAPARE - Administrator, Civil Service (Medical): No    Lack of Transportation (Non-Medical): No  Physical Activity: Sufficiently Active (11/10/2017)   Exercise Vital Sign    Days of Exercise per Week: 4 days    Minutes of Exercise per  Session: 90 min  Stress: No Stress Concern Present (11/10/2017)   [employer] of Occupational Health - Occupational Stress Questionnaire    Feeling of Stress : Not at all  Social Connections: Not on file     Review of Systems  Constitutional:  Negative for appetite change and unexpected weight change.  HENT:  Negative for congestion and sinus pressure.   Respiratory:  Negative for cough, chest tightness and shortness of breath.   Cardiovascular:  Negative for chest pain and palpitations.  Gastrointestinal:  Negative for abdominal pain, diarrhea, nausea and vomiting.  Genitourinary:  Negative for difficulty urinating and dysuria.  Musculoskeletal:  Negative for joint swelling and myalgias.  Skin:  Negative for color change and rash.  Neurological:  Negative for  dizziness and headaches.  Psychiatric/Behavioral:  Negative for agitation and dysphoric mood.        Objective:     BP 134/72   Pulse 79   Temp (!) 97.4 F (36.3 C) (Oral)   Ht 5' (1.524 m)   Wt 126 lb 6.4 oz (57.3 kg)   LMP 04/28/1992   SpO2 97%   BMI 24.69 kg/m  Wt Readings from Last 3 Encounters:  12/25/22 126 lb 6.4 oz (57.3 kg)  12/12/22 126 lb 12.2 oz (57.5 kg)  08/21/22 126 lb 12.8 oz (57.5 kg)    Physical Exam Vitals reviewed.  Constitutional:      General: She is not in acute distress.    Appearance: Normal appearance.  HENT:     Head: Normocephalic and atraumatic.     Right Ear: External ear normal.     Left Ear: External ear normal.  Eyes:     General: No scleral icterus.       Right eye: No discharge.        Left eye: No discharge.     Conjunctiva/sclera: Conjunctivae normal.  Neck:     Thyroid: No thyromegaly.  Cardiovascular:     Rate and Rhythm: Normal rate and regular rhythm.  Pulmonary:     Effort: No respiratory distress.     Breath sounds: Normal breath sounds. No wheezing.  Abdominal:     General: Bowel sounds are normal.     Palpations: Abdomen is soft.     Tenderness: There is no abdominal tenderness.  Musculoskeletal:        General: No swelling or tenderness.     Cervical back: Neck supple. No tenderness.  Lymphadenopathy:     Cervical: No cervical adenopathy.  Skin:    Findings: No erythema or rash.  Neurological:     Mental Status: She is alert.  Psychiatric:        Mood and Affect: Mood normal.        Behavior: Behavior normal.      Outpatient Encounter Medications as of 12/25/2022  Medication Sig   sertraline (ZOLOFT) 25 MG tablet Take 1 tablet (25 mg total) by mouth daily.   amLODipine (NORVASC) 5 MG tablet Take 1 tablet (5 mg total) by mouth daily.   Cholecalciferol (VITAMIN D3) 1000 UNITS CAPS Take 1,000 Units by mouth 2 (two) times daily.    CYANOCOBALAMIN PO Take 1 tablet by mouth daily.    diphenhydrAMINE HCl,  Sleep, (ZZZQUIL PO) Take by mouth. 3 GUMMIES PER NIGHT   fexofenadine (ALLEGRA ALLERGY) 60 MG tablet Take 1 tablet (60 mg total) by mouth 2 (two) times daily.   fluticasone (FLONASE) 50 MCG/ACT nasal spray Place 2 sprays into both nostrils daily.  magnesium 30 MG tablet Take 30 mg by mouth 1 day or 1 dose.   Nutritional Supplements (JOINT FORMULA PO) Take by mouth.   Potassium 95 MG TABS Take by mouth.   rosuvastatin (CRESTOR) 10 MG tablet Take 1 tablet (10 mg total) by mouth daily.   No facility-administered encounter medications on file as of 12/25/2022.     Lab Results  Component Value Date   WBC 8.0 12/25/2022   HGB 13.4 12/25/2022   HCT 41.9 12/25/2022   PLT 265.0 12/25/2022   GLUCOSE 90 12/25/2022   CHOL 142 12/25/2022   TRIG 71.0 12/25/2022   HDL 69.80 12/25/2022   LDLCALC 58 12/25/2022   ALT 23 12/25/2022   AST 25 12/25/2022   NA 139 12/25/2022   K 4.1 12/25/2022   CL 105 12/25/2022   CREATININE 0.85 12/25/2022   BUN 18 12/25/2022   CO2 27 12/25/2022   TSH 1.14 08/21/2022   HGBA1C 6.0 12/25/2022    CT ABDOMEN PELVIS W CONTRAST  Result Date: 12/12/2022 CLINICAL DATA:  Abdominal pain, acute, nonlocalized.  Constipation. EXAM: CT ABDOMEN AND PELVIS WITH CONTRAST TECHNIQUE: Multidetector CT imaging of the abdomen and pelvis was performed using the standard protocol following bolus administration of intravenous contrast. RADIATION DOSE REDUCTION: This exam was performed according to the departmental dose-optimization program which includes automated exposure control, adjustment of the mA and/or kV according to patient size and/or use of iterative reconstruction technique. CONTRAST:  75mL OMNIPAQUE IOHEXOL 300 MG/ML  SOLN COMPARISON:  None Available. FINDINGS: Lower chest: There are patchy atelectatic changes in the visualized lung bases. No overt consolidation. No pleural effusion. The heart is normal in size. No pericardial effusion. Hepatobiliary: The liver is normal in size.  Non-cirrhotic configuration. No suspicious mass. No intrahepatic or extrahepatic bile duct dilation. No calcified gallstones. Normal gallbladder wall thickness. No pericholecystic inflammatory changes. Pancreas: Unremarkable. No pancreatic ductal dilatation or surrounding inflammatory changes. Spleen: Within normal limits. No focal lesion. Adrenals/Urinary Tract: Adrenal glands are unremarkable. No suspicious renal mass. No hydronephrosis. No renal or ureteric calculi. Unremarkable urinary bladder. Stomach/Bowel: No disproportionate dilation of the small or large bowel loops. There is circumferential irregular thickening of the left half of the transverse colon and descending colon with prominence of vasa recta and minimal pericolonic fat stranding. There is ? mucosal hyperenhancement also. In appropriate clinical setting, findings favor acute colitis, most likely infective or inflammatory in etiology. Ischemic etiology appears less likely considering the distribution pattern. There is no pneumatosis or portal venous gas. There is small stool burden mainly in the right hemicolon. The appendix is surgically absent. Vascular/Lymphatic: No ascites or pneumoperitoneum. No abdominal or pelvic lymphadenopathy, by size criteria. No aneurysmal dilation of the major abdominal arteries. There are moderate peripheral atherosclerotic vascular calcifications of the aorta and its major branches. Reproductive: Normal-sized anteverted uterus. There is an approximately 1.0 x 1.4 cm oval hypoattenuating area in the upper anterior uterine body, likely leiomyoma. No large adnexal mass. Other: There is a tiny fat containing umbilical hernia. The soft tissues and abdominal wall are otherwise unremarkable. Musculoskeletal: No suspicious osseous lesions. There are mild - moderate multilevel degenerative changes in the visualized spine. IMPRESSION: 1. Circumferential irregular thickening of the left half of the transverse colon and  descending colon with prominence of vasa recta and minimal pericolonic fat stranding. In appropriate clinical setting, findings favor acute colitis, most likely infective or inflammatory in etiology. Ischemic etiology appears less likely considering the distribution pattern. No pneumatosis or portal venous  gas. 2. Multiple other nonacute observations, as described above. Aortic Atherosclerosis (ICD10-I70.0). Electronically Signed   By: Jules Schick M.D.   On: 12/12/2022 14:02   DG Abdomen 1 View  Result Date: 12/12/2022 CLINICAL DATA:  constipation. EXAM: ABDOMEN - 1 VIEW COMPARISON:  None Available. FINDINGS: There is increased amount of gas throughout the small bowel and colon, likely manifestation of adynamic ileus. Consider radiographic follow up if clinically indicated. There is small-to-moderate stool burden, mainly the ascending colon, compatible with colonic hypomotility. No evidence of pneumoperitoneum, within the limitations of a supine film. No acute osseous abnormalities. The soft tissues are within normal limits. Surgical changes, devices, tubes and lines: None. IMPRESSION: 1. Increased amount of gas throughout the small bowel and colon, likely manifestation of adynamic ileus. Consider radiographic follow up. 2. Small-to-moderate stool burden, mainly the ascending colon, compatible with colonic hypomotility. Electronically Signed   By: Jules Schick M.D.   On: 12/12/2022 12:13       Assessment & Plan:  Hypercholesterolemia Assessment & Plan: The 10-year ASCVD risk score (Arnett DK, et al., 2019) is: 35.4%   Values used to calculate the score:     Age: 45 years     Sex: Female     Is Non-Hispanic African American: No     Diabetic: No     Tobacco smoker: No     Systolic Blood Pressure: 134 mmHg     Is BP treated: Yes     HDL Cholesterol: 69.8 mg/dL     Total Cholesterol: 142 mg/dL  Continue crestor. Follow lipid panel and liver function tests.  Low cholesterol diet and exercise.    Orders: -     Hepatic function panel -     CBC with Differential/Platelet -     Lipid panel  Hyperglycemia Assessment & Plan: Low carb diet and exercise.  Follow met b and a1c. Follow.   Orders: -     Hemoglobin A1c  Primary hypertension Assessment & Plan: On amlodipine.  Blood pressure as outlined. Follow pressures.  Send in readings. Follow metabolic panel. No changes in medication today.   Orders: -     Basic metabolic panel  Stress Assessment & Plan: Handling stress.  Does not feel needs any further intervention.  Follow.    Hollenhorst plaque Assessment & Plan: Continue crestor.    Colitis Assessment & Plan: Evaluated 12/12/22 - abdominal pain. CT - findings favored acute colitis. Lactate wnl.  Treated with cipro and flagyl. Doing better.  No abdominal pain.  Eating.  Discussed importance of keeping bowels moving - benefiber.    Abnormal CT of the abdomen Assessment & Plan: There was an approximately 1.0 x 1.4 cm oval hypoattenuating area in the upper anterior uterine body.  Discussed further evaluation. Agreeable to pelvic ultrasound.    Orders: -     US PELVIC COMPLETE WITH TRANSVAGINAL; Future  Other orders -     Sertraline HCl; Take 1 tablet (25 mg total) by mouth daily.  Dispense: 30 tablet; Refill: 2     Dale [place], MD

## 2022-12-27 ENCOUNTER — Encounter: Payer: Self-pay | Admitting: Internal Medicine

## 2022-12-27 DIAGNOSIS — K529 Noninfective gastroenteritis and colitis, unspecified: Secondary | ICD-10-CM | POA: Insufficient documentation

## 2022-12-27 DIAGNOSIS — R935 Abnormal findings on diagnostic imaging of other abdominal regions, including retroperitoneum: Secondary | ICD-10-CM | POA: Insufficient documentation

## 2022-12-27 NOTE — Assessment & Plan Note (Signed)
Handling stress.  Does not feel needs any further intervention.  Follow.

## 2022-12-27 NOTE — Assessment & Plan Note (Signed)
The 10-year ASCVD risk score (Arnett DK, et al., 2019) is: 35.4%   Values used to calculate the score:     Age: 82 years     Sex: Female     Is Non-Hispanic African American: No     Diabetic: No     Tobacco smoker: No     Systolic Blood Pressure: 134 mmHg     Is BP treated: Yes     HDL Cholesterol: 69.8 mg/dL     Total Cholesterol: 142 mg/dL  Continue crestor. Follow lipid panel and liver function tests.  Low cholesterol diet and exercise.

## 2022-12-27 NOTE — Assessment & Plan Note (Signed)
On amlodipine.  Blood pressure as outlined. Follow pressures.  Send in readings. Follow metabolic panel. No changes in medication today.

## 2022-12-27 NOTE — Assessment & Plan Note (Signed)
Evaluated 12/12/22 - abdominal pain. CT - findings favored acute colitis. Lactate wnl.  Treated with cipro and flagyl. Doing better.  No abdominal pain.  Eating.  Discussed importance of keeping bowels moving - benefiber.

## 2022-12-27 NOTE — Assessment & Plan Note (Signed)
There was an approximately 1.0 x 1.4 cm oval hypoattenuating area in the upper anterior uterine body.  Discussed further evaluation. Agreeable to pelvic ultrasound.

## 2022-12-27 NOTE — Assessment & Plan Note (Signed)
Low carb diet and exercise.  Follow met b and a1c. Follow.

## 2022-12-27 NOTE — Assessment & Plan Note (Signed)
Continue crestor 

## 2022-12-29 ENCOUNTER — Telehealth: Payer: Self-pay

## 2022-12-29 NOTE — Telephone Encounter (Signed)
Lvm for pt to give office a call back in regards to labs

## 2022-12-29 NOTE — Telephone Encounter (Signed)
-----   Message from [place] sent at 12/26/2022  5:21 AM EDT ----- Please call and notify Donna Lara that her white blood cell count is wnl.  (It was elevated in ER).  Back down to wnl now. Cholesterol levels have improved. Overall sugar control increased some from last check, but improved from check prior.  Continue diet and exercise.  Liver function tests are wnl.

## 2023-01-01 ENCOUNTER — Encounter: Payer: Self-pay | Admitting: *Deleted

## 2023-01-05 ENCOUNTER — Ambulatory Visit
Admission: RE | Admit: 2023-01-05 | Discharge: 2023-01-05 | Disposition: A | Discharge status: Home / Self Care | Payer: Medicare Other | Source: Ambulatory Visit | Attending: Internal Medicine | Admitting: Internal Medicine

## 2023-01-05 DIAGNOSIS — D251 Intramural leiomyoma of uterus: Secondary | ICD-10-CM | POA: Diagnosis not present

## 2023-01-05 DIAGNOSIS — R935 Abnormal findings on diagnostic imaging of other abdominal regions, including retroperitoneum: Secondary | ICD-10-CM | POA: Diagnosis not present

## 2023-01-06 ENCOUNTER — Telehealth: Payer: Self-pay

## 2023-01-06 NOTE — Telephone Encounter (Signed)
Transition Care Management Unsuccessful Follow-up Telephone Call  Date of discharge and from where:  12/12/2022 [facility]  Attempts:  1st Attempt  Reason for unsuccessful TCM follow-up call:  Left voice message  Simpson Paulos [facility]  [facility], [facility] Resource Care Guide Direct Dial: [phone]  Website: [email or web address]

## 2023-01-07 ENCOUNTER — Telehealth: Payer: Self-pay

## 2023-01-07 NOTE — Telephone Encounter (Signed)
Transition Care Management Follow-up Telephone Call Date of discharge and from where: 12/12/2022 [facility] How have you been since you were released from the hospital? Patient stated she is feeling much better after completing her antibiotic, no further issues. Any questions or concerns? No  Items Reviewed: Did the pt receive and understand the discharge instructions provided? Yes  Medications obtained and verified? Yes  Other? No  Any new allergies since your discharge? No  Dietary orders reviewed? Yes Do you have support at home? Yes   Follow up appointments reviewed:  PCP Hospital f/u appt confirmed? Yes  Scheduled to see Dale [place], MD on 12/25/2022 @ [place] [employer] [facility] [employer]. Specialist Hospital f/u appt confirmed? No  Scheduled to see  on  @ . Are transportation arrangements needed? No  If their condition worsens, is the pt aware to call PCP or go to the Emergency Dept.? Yes Was the patient provided with contact information for the PCP's office or ED? Yes Was to pt encouraged to call back with questions or concerns? Yes  Nessie Nong [facility]  [facility], [facility] Guide Direct Dial: [phone]  Website: [email or web address]

## 2023-01-09 ENCOUNTER — Encounter: Payer: Self-pay | Admitting: Internal Medicine

## 2023-01-09 ENCOUNTER — Ambulatory Visit (INDEPENDENT_AMBULATORY_CARE_PROVIDER_SITE_OTHER): Payer: Medicare Other | Admitting: Internal Medicine

## 2023-01-09 VITALS — BP 130/78 | HR 83 | Temp 98.2°F | Resp 16 | Ht 60.0 in | Wt 126.4 lb

## 2023-01-09 DIAGNOSIS — R6889 Other general symptoms and signs: Secondary | ICD-10-CM | POA: Diagnosis not present

## 2023-01-09 DIAGNOSIS — R059 Cough, unspecified: Secondary | ICD-10-CM

## 2023-01-09 DIAGNOSIS — I10 Essential (primary) hypertension: Secondary | ICD-10-CM

## 2023-01-09 DIAGNOSIS — Z1152 Encounter for screening for COVID-19: Secondary | ICD-10-CM | POA: Diagnosis not present

## 2023-01-09 LAB — POCT INFLUENZA A/B
Influenza A, POC: NEGATIVE
Influenza B, POC: NEGATIVE

## 2023-01-09 LAB — POC COVID19 BINAXNOW: SARS Coronavirus 2 Ag: NEGATIVE

## 2023-01-09 MED ORDER — AZITHROMYCIN 250 MG PO TABS: ORAL_TABLET | ORAL | 0 refills | Status: AC

## 2023-01-09 NOTE — Progress Notes (Unsigned)
Subjective:    [name], female    DOB: [birth year], 82 y.o.   MRN: [id number]  Patient here for  Chief Complaint  Patient presents with   CHEST CONGESTION   Cough   Hoarse    Cough Pertinent negatives include no chest pain, fever, headaches, myalgias, rash, sore throat or shortness of breath.   Here as a work in appt.  Work in for increased cough and congestion - chest congestion.  States symptoms started 4-5 days ago.  No sinus pressure.  No nasal congestion or drainage.  No sob.  No chest pain.  No vomiting or diarrhea.  No fever.  No definite sick contacts that she is aware of.     Past Medical History:  Diagnosis Date   Allergy    Carpal tunnel syndrome 1999   GERD (gastroesophageal reflux disease)    Hypercholesterolemia    By last lipid panel   Hypertension    Borderline. She does not take the 12.5 HCTZ daily   Past Surgical History:  Procedure Laterality Date   APPENDECTOMY  1963   BREAST CYST ASPIRATION Right 07/12/2020   CARPAL TUNNEL RELEASE     right hand   COLONOSCOPY     COLONOSCOPY WITH PROPOFOL N/A 12/01/2016   Procedure: COLONOSCOPY WITH PROPOFOL;  Surgeon: Midge Minium, MD;  Location: [facility];  Service: Gastroenterology;  Laterality: N/A;   EYE SURGERY     left   POLYPECTOMY  12/01/2016   Procedure: POLYPECTOMY INTESTINAL;  Surgeon: Midge Minium, MD;  Location: [facility];  Service: Gastroenterology;;  Transverse colon polyp x 2 Ascending colon polyp x 3   TONSILLECTOMY     Family History  Problem Relation Age of Onset   Diabetes Mother    Hypertension Brother    Diabetes Maternal Grandmother    Asthma Daughter    Breast cancer Other    Colon cancer Neg Hx    Social History   Socioeconomic History   Marital status: Married    Spouse name: Not on file   Number of children: 3   Years of education: Not on file   Highest education level: Not on file  Occupational History    Employer: OTHER  Tobacco Use    Smoking status: Never   Smokeless tobacco: Never  Vaping Use   Vaping status: Never Used  Substance and Sexual Activity   Alcohol use: Yes    Alcohol/week: 0.0 standard drinks of alcohol    Comment: 2-3 drinks/month   Drug use: No   Sexual activity: Never  Other Topics Concern   Not on file  Social History Narrative   Recently widowed. Her husband died of a heart attack in 2015/05/19  Social Determinants of Health   Financial Resource Strain: Low Risk  (11/10/2017)   Overall Financial Resource Strain (CARDIA)    Difficulty of Paying Living Expenses: Not hard at all  Food Insecurity: No Food Insecurity (12/11/2021)   Hunger Vital Sign    Worried About Running Out of Food in the Last Year: Never true    Ran Out of Food in the Last Year: Never true  Transportation Needs: No Transportation Needs (12/11/2021)   PRAPARE - Administrator, Civil Service (Medical): No    Lack of Transportation (Non-Medical): No  Physical Activity: Sufficiently Active (11/10/2017)   Exercise Vital Sign    Days of Exercise per Week: 4 days    Minutes of Exercise per  Session: 90 min  Stress: No Stress Concern Present (11/10/2017)   [employer] of Occupational Health - Occupational Stress Questionnaire    Feeling of Stress : Not at all  Social Connections: Not on file     Review of Systems  Constitutional:  Negative for appetite change and fever.  HENT:  Negative for congestion, sinus pressure and sore throat.   Respiratory:  Positive for cough. Negative for chest tightness and shortness of breath.   Cardiovascular:  Negative for chest pain, palpitations and leg swelling.  Gastrointestinal:  Negative for abdominal pain, diarrhea, nausea and vomiting.  Genitourinary:  Negative for dysuria.  Musculoskeletal:  Negative for joint swelling and myalgias.  Skin:  Negative for color change and rash.  Neurological:  Negative for dizziness and headaches.  Psychiatric/Behavioral:  Negative for  agitation and dysphoric mood.        Objective:     BP 130/78   Pulse 83   Temp 98.2 F (36.8 C)   Resp 16   Ht 5' (1.524 m)   Wt 126 lb 6.4 oz (57.3 kg)   LMP 04/28/1992   SpO2 98%   BMI 24.69 kg/m  Wt Readings from Last 3 Encounters:  01/09/23 126 lb 6.4 oz (57.3 kg)  12/25/22 126 lb 6.4 oz (57.3 kg)  12/12/22 126 lb 12.2 oz (57.5 kg)    Physical Exam Vitals reviewed.  Constitutional:      General: She is not in acute distress.    Appearance: Normal appearance.  HENT:     Head: Normocephalic and atraumatic.     Right Ear: External ear normal.     Left Ear: External ear normal.  Eyes:     General: No scleral icterus.       Right eye: No discharge.        Left eye: No discharge.     Conjunctiva/sclera: Conjunctivae normal.  Neck:     Thyroid: No thyromegaly.  Cardiovascular:     Rate and Rhythm: Normal rate and regular rhythm.  Pulmonary:     Effort: No respiratory distress.     Breath sounds: Normal breath sounds. No wheezing.  Abdominal:     General: Bowel sounds are normal.     Palpations: Abdomen is soft.     Tenderness: There is no abdominal tenderness.  Musculoskeletal:        General: No swelling or tenderness.     Cervical back: Neck supple. No tenderness.  Lymphadenopathy:     Cervical: No cervical adenopathy.  Skin:    Findings: No erythema or rash.  Neurological:     Mental Status: She is alert.  Psychiatric:        Mood and Affect: Mood normal.        Behavior: Behavior normal.      Outpatient Encounter Medications as of 01/09/2023  Medication Sig   azithromycin (ZITHROMAX) 250 MG tablet Take 2 tablets on day 1, then 1 tablet daily on days 2 through 5   amLODipine (NORVASC) 5 MG tablet Take 1 tablet (5 mg total) by mouth daily.   Cholecalciferol (VITAMIN D3) 1000 UNITS CAPS Take 1,000 Units by mouth 2 (two) times daily.    CYANOCOBALAMIN PO Take 1 tablet by mouth daily.    diphenhydrAMINE HCl, Sleep, (ZZZQUIL PO) Take by mouth. 3  GUMMIES PER NIGHT   fexofenadine (ALLEGRA ALLERGY) 60 MG tablet Take 1 tablet (60 mg total) by mouth 2 (two) times daily.   fluticasone (FLONASE) 50 MCG/ACT nasal  spray Place 2 sprays into both nostrils daily.   magnesium 30 MG tablet Take 30 mg by mouth 1 day or 1 dose.   Nutritional Supplements (JOINT FORMULA PO) Take by mouth.   Potassium 95 MG TABS Take by mouth.   rosuvastatin (CRESTOR) 10 MG tablet Take 1 tablet (10 mg total) by mouth daily.   sertraline (ZOLOFT) 25 MG tablet Take 1 tablet (25 mg total) by mouth daily.   No facility-administered encounter medications on file as of 01/09/2023.     Lab Results  Component Value Date   WBC 8.0 12/25/2022   HGB 13.4 12/25/2022   HCT 41.9 12/25/2022   PLT 265.0 12/25/2022   GLUCOSE 90 12/25/2022   CHOL 142 12/25/2022   TRIG 71.0 12/25/2022   HDL 69.80 12/25/2022   LDLCALC 58 12/25/2022   ALT 23 12/25/2022   AST 25 12/25/2022   NA 139 12/25/2022   K 4.1 12/25/2022   CL 105 12/25/2022   CREATININE 0.85 12/25/2022   BUN 18 12/25/2022   CO2 27 12/25/2022   TSH 1.14 08/21/2022   HGBA1C 6.0 12/25/2022    No results found.     Assessment & Plan:  Encounter for screening for COVID-19 -     POC COVID-19 BinaxNow  Flu-like symptoms -     POCT Influenza A/B  Other orders -     Azithromycin; Take 2 tablets on day 1, then 1 tablet daily on days 2 through 5  Dispense: 6 tablet; Refill: 0     Dale [place], MD

## 2023-01-09 NOTE — Patient Instructions (Signed)
Saline nasal spray - flush nose 1-2x/day  Robitussin DM for cough and congestion.

## 2023-01-11 ENCOUNTER — Encounter: Payer: Self-pay | Admitting: Internal Medicine

## 2023-01-11 NOTE — Assessment & Plan Note (Signed)
On amlodipine.  Blood pressure as outlined. Follow pressures. Follow metabolic panel. No changes in medication today.

## 2023-01-11 NOTE — Assessment & Plan Note (Signed)
Increased cough and congestion as outlined.  Flu swab and covid swab negative.  No chest pain, tightness or sob.  Symptoms c/w URI.  Treat with zpak as directed.  Robitussin DM as directed.  Saline to keep nose open.  Follow.

## 2023-01-12 ENCOUNTER — Telehealth: Payer: Self-pay | Admitting: Internal Medicine

## 2023-01-12 NOTE — Telephone Encounter (Signed)
error 

## 2023-01-13 ENCOUNTER — Ambulatory Visit (INDEPENDENT_AMBULATORY_CARE_PROVIDER_SITE_OTHER): Payer: Medicare Other | Admitting: Family Medicine

## 2023-01-13 ENCOUNTER — Encounter: Payer: Self-pay | Admitting: Family Medicine

## 2023-01-13 VITALS — BP 126/66 | HR 85 | Temp 97.9°F | Resp 16 | Ht 60.25 in | Wt 126.5 lb

## 2023-01-13 DIAGNOSIS — J309 Allergic rhinitis, unspecified: Secondary | ICD-10-CM | POA: Diagnosis not present

## 2023-01-13 DIAGNOSIS — R059 Cough, unspecified: Secondary | ICD-10-CM | POA: Diagnosis not present

## 2023-01-13 DIAGNOSIS — R053 Chronic cough: Secondary | ICD-10-CM

## 2023-01-13 DIAGNOSIS — Z9109 Other allergy status, other than to drugs and biological substances: Secondary | ICD-10-CM | POA: Diagnosis not present

## 2023-01-13 MED ORDER — PREDNISONE 20 MG PO TABS
20.0000 mg | ORAL_TABLET | Freq: Every day | ORAL | 0 refills | Status: AC
Start: 2023-01-13 — End: 2023-01-18

## 2023-01-13 MED ORDER — DEXTROMETHORPHAN HBR 10 MG/15ML PO SYRP: 10.0000 mg | ORAL_SOLUTION | Freq: Four times a day (QID) | ORAL | 1 refills | Status: DC

## 2023-01-13 NOTE — Progress Notes (Signed)
SUBJECTIVE:   Chief Complaint  Patient presents with   Cough    X couple week but getting worse.   HPI Presents for acute visit  Discussed the use of AI scribe software for clinical note transcription with the patient, who gave verbal consent to proceed.  History of Present Illness The patient, with a history of exposure to secondhand smoke and pet ownership, presents with a persistent cough that has been ongoing for approximately two weeks. The cough, described as located 'right here,' has been associated with disturbed sleep but no reported fever, sputum production, or wheezing. The patient denies any nasal discharge or sore throat.  The patient was seen by her primary care physician four days prior to this consultation and was started on a course of Zithromax for chest congestion. The patient completed the course of antibiotics, noting a slight improvement in symptoms but not a significant resolution.  The patient's appetite has been reportedly reduced, although she has been maintaining hydration and consuming small amounts of food when possible. The patient denies any other new symptoms or changes in health.  The patient has a history of pet ownership, including dogs, cats, and birds, and grew up in a household where the father smoked. The patient also admits to a brief period of smoking during her teenage years. The patient has not been exposed to any known respiratory irritants recently.  The patient's cough seems to be worse in the mornings, and she expresses a desire to avoid getting sick again. The patient is currently using Flonase and Allegra for symptom management but is out of Flonase. The patient denies any history of asthma or pneumonia.    PERTINENT PMH / PSH: As above  OBJECTIVE:  BP 126/66   Pulse 85   Temp 97.9 F (36.6 C)   Resp 16   Ht 5' 0.25" (1.53 m)   Wt 126 lb 8 oz (57.4 kg)   LMP 04/28/1992   SpO2 97%   BMI 24.50 kg/m    Physical Exam Vitals  reviewed.  Constitutional:      General: She is not in acute distress.    Appearance: Normal appearance. She is normal weight. She is not ill-appearing, toxic-appearing or diaphoretic.  Eyes:     General:        Right eye: No discharge.        Left eye: No discharge.     Conjunctiva/sclera: Conjunctivae normal.  Cardiovascular:     Rate and Rhythm: Normal rate and regular rhythm.     Heart sounds: Normal heart sounds.  Pulmonary:     Effort: Pulmonary effort is normal.  Musculoskeletal:        General: Normal range of motion.  Skin:    General: Skin is warm and dry.  Neurological:     General: No focal deficit present.     Mental Status: She is alert and oriented to person, place, and time. Mental status is at baseline.  Psychiatric:        Mood and Affect: Mood normal.        Behavior: Behavior normal.        Thought Content: Thought content normal.        Judgment: Judgment normal.        01/13/2023    1:46 PM 12/25/2022   11:06 AM 08/21/2022   11:37 AM 02/18/2022   10:20 AM 12/20/2021    2:05 PM  Depression screen PHQ 2/9  Decreased Interest 0 1 0  0 0  Down, Depressed, Hopeless 0 0 0 1 0  PHQ - 2 Score 0 1 0 1 0  Altered sleeping 3 1     Tired, decreased energy 2 1     Change in appetite 2 0     Feeling bad or failure about yourself  0 0     Trouble concentrating 1 0     Moving slowly or fidgety/restless 1 0     Suicidal thoughts 0 0     PHQ-9 Score 9 3     Difficult doing work/chores Not difficult at all Somewhat difficult         01/13/2023    1:46 PM  GAD 7 : Generalized Anxiety Score  Nervous, Anxious, on Edge 0  Control/stop worrying 0  Worry too much - different things 0  Trouble relaxing 1  Restless 0  Easily annoyed or irritable 0  Afraid - awful might happen 0  Total GAD 7 Score 1  Anxiety Difficulty Somewhat difficult    ASSESSMENT/PLAN:  Cough, unspecified type Assessment & Plan: Cough for approximately 2 weeks, partially improved with  Zithromax. No fever, minimal sputum production, no wheezing on exam. -Dextromethorphan for symptomatic relief of cough. -Prednisone 20 mg daily x 5 days -Follow up with PCP if no improvement  Orders: -     predniSONE; Take 1 tablet (20 mg total) by mouth daily with breakfast for 5 days.  Dispense: 5 tablet; Refill: 0 -     Dextromethorphan HBr; Take 15 mLs (10 mg total) by mouth 4 (four) times daily.  Dispense: 120 mL; Refill: 1  Environmental allergies Assessment & Plan: Reports use of Flonase and Allegra, currently out of Flonase. -Refill Flonase prescription.     PDMP reviewed  Return if symptoms worsen or fail to improve, for PCP.  Dana Allan, MD

## 2023-01-13 NOTE — Patient Instructions (Addendum)
It was a pleasure meeting you today. Thank you for allowing me to take part in your health care.  Our goals for today as we discussed include:  Start Prednisone 20 mg daily Start Dextromethorphan 15 mls 4 times a day as needed for cough Honey tea for cough  Follow up with PCP if no improvement   If you have any questions or concerns, please do not hesitate to call the office at [phone].  I look forward to our next visit and until then take care and stay safe.  Regards,   Dana Allan, MD   [facility]

## 2023-01-18 ENCOUNTER — Encounter: Payer: Self-pay | Admitting: Family Medicine

## 2023-01-18 MED ORDER — FLUTICASONE PROPIONATE 50 MCG/ACT NA SUSP: 2.0000 | Freq: Every day | NASAL | 6 refills | Status: AC

## 2023-01-18 NOTE — Assessment & Plan Note (Signed)
Cough for approximately 2 weeks, partially improved with Zithromax. No fever, minimal sputum production, no wheezing on exam. -Dextromethorphan for symptomatic relief of cough. -Prednisone 20 mg daily x 5 days -Follow up with PCP if no improvement

## 2023-01-18 NOTE — Assessment & Plan Note (Signed)
Reports use of Flonase and Allegra, currently out of Flonase. -Refill Flonase prescription.

## 2023-01-28 ENCOUNTER — Other Ambulatory Visit: Payer: Self-pay

## 2023-01-28 DIAGNOSIS — R9389 Abnormal findings on diagnostic imaging of other specified body structures: Secondary | ICD-10-CM

## 2023-02-20 ENCOUNTER — Ambulatory Visit: Payer: Medicare Other | Admitting: Internal Medicine

## 2023-02-20 ENCOUNTER — Encounter: Payer: Self-pay | Admitting: Internal Medicine

## 2023-02-20 NOTE — Progress Notes (Deleted)
Subjective:    [name], female    DOB: 02-Mar-1941, 82 y.o.   MRN: [id number]  Patient here for No chief complaint on file.   HPI Here to follow up regarding hypercholesterolemia and hypertension. On amlodipine.  Previous pelvic ultrasound - revealed possible ill defined anterior uterine fibroid.  Was referred to gyn.  Referred to Dr Servando Snare - f/u colonoscopy.  Past Medical History:  Diagnosis Date   Allergy    Carpal tunnel syndrome 1999   GERD (gastroesophageal reflux disease)    Hypercholesterolemia    By last lipid panel   Hypertension    Borderline. She does not take the 12.5 HCTZ daily   Past Surgical History:  Procedure Laterality Date   APPENDECTOMY  1963   BREAST CYST ASPIRATION Right 07/12/2020   CARPAL TUNNEL RELEASE     right hand   COLONOSCOPY     COLONOSCOPY WITH PROPOFOL N/A 12/01/2016   Procedure: COLONOSCOPY WITH PROPOFOL;  Surgeon: Midge Minium, MD;  Location: [facility];  Service: Gastroenterology;  Laterality: N/A;   EYE SURGERY     left   POLYPECTOMY  12/01/2016   Procedure: POLYPECTOMY INTESTINAL;  Surgeon: Midge Minium, MD;  Location: [facility];  Service: Gastroenterology;;  Transverse colon polyp x 2 Ascending colon polyp x 3   TONSILLECTOMY     Family History  Problem Relation Age of Onset   Diabetes Mother    Hypertension Brother    Diabetes Maternal Grandmother    Asthma Daughter    Breast cancer Other    Colon cancer Neg Hx    Social History   Socioeconomic History   Marital status: Married    Spouse name: Not on file   Number of children: 3   Years of education: Not on file   Highest education level: Not on file  Occupational History    Employer: OTHER  Tobacco Use   Smoking status: Never   Smokeless tobacco: Never  Vaping Use   Vaping status: Never Used  Substance and Sexual Activity   Alcohol use: Yes    Alcohol/week: 0.0 standard drinks of alcohol    Comment: 2-3 drinks/month   Drug use: No    Sexual activity: Never  Other Topics Concern   Not on file  Social History Narrative   Recently widowed. Her husband died of a heart attack in 05/24/15  Social Determinants of Health   Financial Resource Strain: Low Risk  (11/10/2017)   Overall Financial Resource Strain (CARDIA)    Difficulty of Paying Living Expenses: Not hard at all  Food Insecurity: No Food Insecurity (12/11/2021)   Hunger Vital Sign    Worried About Running Out of Food in the Last Year: Never true    Ran Out of Food in the Last Year: Never true  Transportation Needs: No Transportation Needs (12/11/2021)   PRAPARE - Administrator, Civil Service (Medical): No    Lack of Transportation (Non-Medical): No  Physical Activity: Sufficiently Active (11/10/2017)   Exercise Vital Sign    Days of Exercise per Week: 4 days    Minutes of Exercise per Session: 90 min  Stress: No Stress Concern Present (11/10/2017)   [employer] of Occupational Health - Occupational Stress Questionnaire    Feeling of Stress : Not at all  Social Connections: Not on file     Review of Systems     Objective:     LMP 04/28/1992  Wt Readings  from Last 3 Encounters:  01/13/23 126 lb 8 oz (57.4 kg)  01/09/23 126 lb 6.4 oz (57.3 kg)  12/25/22 126 lb 6.4 oz (57.3 kg)    Physical Exam   Outpatient Encounter Medications as of 02/20/2023  Medication Sig   amLODipine (NORVASC) 5 MG tablet Take 1 tablet (5 mg total) by mouth daily.   Cholecalciferol (VITAMIN D3) 1000 UNITS CAPS Take 1,000 Units by mouth 2 (two) times daily.    CYANOCOBALAMIN PO Take 1 tablet by mouth daily.    Dextromethorphan HBr 10 MG/15ML SYRP Take 15 mLs (10 mg total) by mouth 4 (four) times daily.   diphenhydrAMINE HCl, Sleep, (ZZZQUIL PO) Take by mouth. 3 GUMMIES PER NIGHT   fexofenadine (ALLEGRA ALLERGY) 60 MG tablet Take 1 tablet (60 mg total) by mouth 2 (two) times daily.   fluticasone (FLONASE) 50 MCG/ACT nasal spray Place 2 sprays into both  nostrils daily.   magnesium 30 MG tablet Take 30 mg by mouth 1 day or 1 dose.   Nutritional Supplements (JOINT FORMULA PO) Take by mouth.   Potassium 95 MG TABS Take by mouth.   rosuvastatin (CRESTOR) 10 MG tablet Take 1 tablet (10 mg total) by mouth daily.   sertraline (ZOLOFT) 25 MG tablet Take 1 tablet (25 mg total) by mouth daily.   No facility-administered encounter medications on file as of 02/20/2023.     Lab Results  Component Value Date   WBC 8.0 12/25/2022   HGB 13.4 12/25/2022   HCT 41.9 12/25/2022   PLT 265.0 12/25/2022   GLUCOSE 90 12/25/2022   CHOL 142 12/25/2022   TRIG 71.0 12/25/2022   HDL 69.80 12/25/2022   LDLCALC 58 12/25/2022   ALT 23 12/25/2022   AST 25 12/25/2022   NA 139 12/25/2022   K 4.1 12/25/2022   CL 105 12/25/2022   CREATININE 0.85 12/25/2022   BUN 18 12/25/2022   CO2 27 12/25/2022   TSH 1.14 08/21/2022   HGBA1C 6.0 12/25/2022    US Pelvic Complete With Transvaginal  Result Date: 01/24/2023 CLINICAL DATA:  abnormal ct abdomen EXAM: ULTRASOUND OF PELVIS TECHNIQUE: Transabdominal and transvaginalultrasound examination of the pelvis was performed including evaluation of the uterus, ovaries, adnexal regions, and pelvic cul-de-sac. COMPARISON:  None Available. FINDINGS: Uterusanteverted, 7 x 5 x 4 cm. The endometrium unremarkable, 0.3 cm. The uterine cavity is empty. Poorly defined anterior intramural fibroid measures 1.6 cm. Ovaries not visualized.  No adnexal masses or fluid collections. IMPRESSION: Possible ill-defined anterior uterine fibroid. Ovaries not visualized. No adnexal pathology. Electronically Signed   By: Layla Maw M.D.   On: 01/24/2023 10:47       Assessment & Plan:  There are no diagnoses linked to this encounter.   Dale Hooppole, MD

## 2023-02-20 NOTE — Progress Notes (Signed)
[name], female   DOB: Oct 18, 1940, 82 y.o.   MRN: [id number] Did not show for appt

## 2023-03-11 ENCOUNTER — Encounter: Payer: Self-pay | Admitting: Internal Medicine

## 2023-03-11 ENCOUNTER — Ambulatory Visit: Payer: Medicare Other | Admitting: Internal Medicine

## 2023-03-11 VITALS — BP 130/70 | HR 81 | Temp 97.8°F | Resp 16 | Ht 60.0 in | Wt 126.6 lb

## 2023-03-11 DIAGNOSIS — M25562 Pain in left knee: Secondary | ICD-10-CM | POA: Diagnosis not present

## 2023-03-11 DIAGNOSIS — R739 Hyperglycemia, unspecified: Secondary | ICD-10-CM

## 2023-03-11 DIAGNOSIS — I10 Essential (primary) hypertension: Secondary | ICD-10-CM | POA: Diagnosis not present

## 2023-03-11 DIAGNOSIS — E7800 Pure hypercholesterolemia, unspecified: Secondary | ICD-10-CM | POA: Diagnosis not present

## 2023-03-11 MED ORDER — AMLODIPINE BESYLATE 5 MG PO TABS: 5.0000 mg | ORAL_TABLET | Freq: Every day | ORAL | 1 refills | Status: AC

## 2023-03-11 MED ORDER — SERTRALINE HCL 25 MG PO TABS: 25.0000 mg | ORAL_TABLET | Freq: Every day | ORAL | 2 refills | Status: AC

## 2023-03-11 MED ORDER — ROSUVASTATIN CALCIUM 10 MG PO TABS: 10.0000 mg | ORAL_TABLET | Freq: Every day | ORAL | 1 refills | Status: AC

## 2023-03-11 NOTE — Progress Notes (Signed)
Subjective:    [name], female    DOB: Mar 16, 1941, 82 y.o.   MRN: [id number]  Patient here for  Chief Complaint  Patient presents with   Leg Pain   Knee Pain    HPI Work in appt - leg and knee pain. She reports that starting 2 weeks ago, noticed pain behind left knee.  Notices more when she is active - working in yard, raking leaves, vacuuming, etc.  Pain localized to mid posterior thigh to popliteal region.  Normally no pain with sitting or lying. No injury or trauma. No increased erythema or swelling.  Some fullness - left popliteal region. Stays active.    Past Medical History:  Diagnosis Date   Allergy    Carpal tunnel syndrome 1999   GERD (gastroesophageal reflux disease)    Hypercholesterolemia    By last lipid panel   Hypertension    Borderline. She does not take the 12.5 HCTZ daily   Past Surgical History:  Procedure Laterality Date   APPENDECTOMY  1963   BREAST CYST ASPIRATION Right 07/12/2020   CARPAL TUNNEL RELEASE     right hand   COLONOSCOPY     COLONOSCOPY WITH PROPOFOL N/A 12/01/2016   Procedure: COLONOSCOPY WITH PROPOFOL;  Surgeon: Midge Minium, MD;  Location: [facility];  Service: Gastroenterology;  Laterality: N/A;   EYE SURGERY     left   POLYPECTOMY  12/01/2016   Procedure: POLYPECTOMY INTESTINAL;  Surgeon: Midge Minium, MD;  Location: [facility];  Service: Gastroenterology;;  Transverse colon polyp x 2 Ascending colon polyp x 3   TONSILLECTOMY     Family History  Problem Relation Age of Onset   Diabetes Mother    Hypertension Brother    Diabetes Maternal Grandmother    Asthma Daughter    Breast cancer Other    Colon cancer Neg Hx    Social History   Socioeconomic History   Marital status: Married    Spouse name: Not on file   Number of children: 3   Years of education: Not on file   Highest education level: Not on file  Occupational History    Employer: OTHER  Tobacco Use   Smoking status: Never    Smokeless tobacco: Never  Vaping Use   Vaping status: Never Used  Substance and Sexual Activity   Alcohol use: Yes    Alcohol/week: 0.0 standard drinks of alcohol    Comment: 2-3 drinks/month   Drug use: No   Sexual activity: Never  Other Topics Concern   Not on file  Social History Narrative   Recently widowed. Her husband died of a heart attack in 08-May-2015  Social Determinants of Health   Financial Resource Strain: Low Risk  (11/10/2017)   Overall Financial Resource Strain (CARDIA)    Difficulty of Paying Living Expenses: Not hard at all  Food Insecurity: No Food Insecurity (12/11/2021)   Hunger Vital Sign    Worried About Running Out of Food in the Last Year: Never true    Ran Out of Food in the Last Year: Never true  Transportation Needs: No Transportation Needs (12/11/2021)   PRAPARE - Administrator, Civil Service (Medical): No    Lack of Transportation (Non-Medical): No  Physical Activity: Sufficiently Active (11/10/2017)   Exercise Vital Sign    Days of Exercise per Week: 4 days    Minutes of Exercise per Session: 90 min  Stress: No Stress Concern Present (  11/10/2017)   [employer] - Occupational Stress Questionnaire    Feeling of Stress : Not at all  Social Connections: Not on file     Review of Systems  Constitutional:  Negative for appetite change and unexpected weight change.  HENT:  Negative for congestion and sinus pressure.   Respiratory:  Negative for cough, chest tightness and shortness of breath.   Cardiovascular:  Negative for chest pain, palpitations and leg swelling.  Gastrointestinal:  Negative for abdominal pain, diarrhea, nausea and vomiting.  Genitourinary:  Negative for difficulty urinating and dysuria.  Musculoskeletal:  Negative for myalgias.       Increased pain - left posterior knee/popliteal region. No back pain.   Skin:  Negative for color change and rash.  Neurological:  Negative for dizziness and  headaches.  Psychiatric/Behavioral:  Negative for agitation and dysphoric mood.        Objective:     BP 130/70   Pulse 81   Temp 97.8 F (36.6 C)   Resp 16   Ht 5' (1.524 m)   Wt 126 lb 9.6 oz (57.4 kg)   LMP 04/28/1992   SpO2 98%   BMI 24.72 kg/m  Wt Readings from Last 3 Encounters:  03/11/23 126 lb 9.6 oz (57.4 kg)  01/13/23 126 lb 8 oz (57.4 kg)  01/09/23 126 lb 6.4 oz (57.3 kg)    Physical Exam Vitals reviewed.  Constitutional:      General: She is not in acute distress.    Appearance: Normal appearance.  HENT:     Head: Normocephalic and atraumatic.     Right Ear: External ear normal.     Left Ear: External ear normal.  Eyes:     General: No scleral icterus.       Right eye: No discharge.        Left eye: No discharge.     Conjunctiva/sclera: Conjunctivae normal.  Neck:     Thyroid: No thyromegaly.  Cardiovascular:     Rate and Rhythm: Normal rate and regular rhythm.  Pulmonary:     Effort: No respiratory distress.     Breath sounds: Normal breath sounds. No wheezing.  Abdominal:     General: Bowel sounds are normal.     Palpations: Abdomen is soft.     Tenderness: There is no abdominal tenderness.  Musculoskeletal:        General: No swelling.     Cervical back: Neck supple. No tenderness.     Comments: Negative SLR.  Some increased fullness with full extension.  No pain with resistance against flexion and extension of left leg. No instability of knee joint.   Lymphadenopathy:     Cervical: No cervical adenopathy.  Skin:    Findings: No erythema or rash.  Neurological:     Mental Status: She is alert.  Psychiatric:        Mood and Affect: Mood normal.        Behavior: Behavior normal.      Outpatient Encounter Medications as of 03/11/2023  Medication Sig   amLODipine (NORVASC) 5 MG tablet Take 1 tablet (5 mg total) by mouth daily.   Cholecalciferol (VITAMIN D3) 1000 UNITS CAPS Take 1,000 Units by mouth 2 (two) times daily.    CYANOCOBALAMIN  PO Take 1 tablet by mouth daily.    diphenhydrAMINE HCl, Sleep, (ZZZQUIL PO) Take by mouth. 3 GUMMIES PER NIGHT   fexofenadine (ALLEGRA ALLERGY) 60 MG tablet Take 1 tablet (60 mg  total) by mouth 2 (two) times daily.   fluticasone (FLONASE) 50 MCG/ACT nasal spray Place 2 sprays into both nostrils daily.   magnesium 30 MG tablet Take 30 mg by mouth 1 day or 1 dose.   Nutritional Supplements (JOINT FORMULA PO) Take by mouth.   Potassium 95 MG TABS Take by mouth.   rosuvastatin (CRESTOR) 10 MG tablet Take 1 tablet (10 mg total) by mouth daily.   sertraline (ZOLOFT) 25 MG tablet Take 1 tablet (25 mg total) by mouth daily.   [DISCONTINUED] Dextromethorphan HBr 10 MG/15ML SYRP Take 15 mLs (10 mg total) by mouth 4 (four) times daily.   No facility-administered encounter medications on file as of 03/11/2023.     Lab Results  Component Value Date   WBC 8.0 12/25/2022   HGB 13.4 12/25/2022   HCT 41.9 12/25/2022   PLT 265.0 12/25/2022   GLUCOSE 90 12/25/2022   CHOL 142 12/25/2022   TRIG 71.0 12/25/2022   HDL 69.80 12/25/2022   LDLCALC 58 12/25/2022   ALT 23 12/25/2022   AST 25 12/25/2022   NA 139 12/25/2022   K 4.1 12/25/2022   CL 105 12/25/2022   CREATININE 0.85 12/25/2022   BUN 18 12/25/2022   CO2 27 12/25/2022   TSH 1.14 08/21/2022   HGBA1C 6.0 12/25/2022    US Pelvic Complete With Transvaginal  Result Date: 01/24/2023 CLINICAL DATA:  abnormal ct abdomen EXAM: ULTRASOUND OF PELVIS TECHNIQUE: Transabdominal and transvaginalultrasound examination of the pelvis was performed including evaluation of the uterus, ovaries, adnexal regions, and pelvic cul-de-sac. COMPARISON:  None Available. FINDINGS: Uterusanteverted, 7 x 5 x 4 cm. The endometrium unremarkable, 0.3 cm. The uterine cavity is empty. Poorly defined anterior intramural fibroid measures 1.6 cm. Ovaries not visualized.  No adnexal masses or fluid collections. IMPRESSION: Possible ill-defined anterior uterine fibroid. Ovaries not  visualized. No adnexal pathology. Electronically Signed   By: Layla Maw M.D.   On: 01/24/2023 10:47       Assessment & Plan:  Left knee pain, unspecified chronicity Assessment & Plan: Posterior knee pain/popliteal pain/fullness as outlined.  Limiting increased activity. Discussed possible Baker's cyst.  Will refer to ortho for further evaluation and treatment.   Orders: -     Ambulatory referral to Orthopedic Surgery  Primary hypertension Assessment & Plan: On amlodipine.  Blood pressure as outlined. Follow pressures. Follow metabolic panel. No changes in medication today.       Dale [place], MD

## 2023-03-11 NOTE — Assessment & Plan Note (Signed)
Posterior knee pain/popliteal pain/fullness as outlined.  Limiting increased activity. Discussed possible Baker's cyst.  Will refer to ortho for further evaluation and treatment.

## 2023-03-11 NOTE — Addendum Note (Signed)
Addended by: Rita Ohara D on: 03/11/2023 08:33 AM   Modules accepted: Orders

## 2023-03-11 NOTE — Assessment & Plan Note (Signed)
On amlodipine.  Blood pressure as outlined. Follow pressures. Follow metabolic panel. No changes in medication today.

## 2023-03-20 ENCOUNTER — Telehealth: Payer: Self-pay

## 2023-03-20 NOTE — Telephone Encounter (Signed)
Patient states she wanted to let us know that she has not heard from Dr. Thomasene Mohair office regarding scheduling an appointment.

## 2023-03-23 NOTE — Telephone Encounter (Signed)
Called and provided patient with information to [facility] for her to call and schedule. She is going to call them

## 2023-03-25 DIAGNOSIS — M11861 Other specified crystal arthropathies, right knee: Secondary | ICD-10-CM | POA: Diagnosis not present

## 2023-03-25 DIAGNOSIS — M1712 Unilateral primary osteoarthritis, left knee: Secondary | ICD-10-CM | POA: Diagnosis not present

## 2023-05-22 DIAGNOSIS — D692 Other nonthrombocytopenic purpura: Secondary | ICD-10-CM | POA: Diagnosis not present

## 2023-05-22 DIAGNOSIS — D2272 Melanocytic nevi of left lower limb, including hip: Secondary | ICD-10-CM | POA: Diagnosis not present

## 2023-05-22 DIAGNOSIS — L814 Other melanin hyperpigmentation: Secondary | ICD-10-CM | POA: Diagnosis not present

## 2023-05-22 DIAGNOSIS — D2271 Melanocytic nevi of right lower limb, including hip: Secondary | ICD-10-CM | POA: Diagnosis not present

## 2023-05-22 DIAGNOSIS — D225 Melanocytic nevi of trunk: Secondary | ICD-10-CM | POA: Diagnosis not present

## 2023-05-22 DIAGNOSIS — Z08 Encounter for follow-up examination after completed treatment for malignant neoplasm: Secondary | ICD-10-CM | POA: Diagnosis not present

## 2023-05-22 DIAGNOSIS — D2261 Melanocytic nevi of right upper limb, including shoulder: Secondary | ICD-10-CM | POA: Diagnosis not present

## 2023-05-22 DIAGNOSIS — D2262 Melanocytic nevi of left upper limb, including shoulder: Secondary | ICD-10-CM | POA: Diagnosis not present

## 2023-05-22 DIAGNOSIS — Z85828 Personal history of other malignant neoplasm of skin: Secondary | ICD-10-CM | POA: Diagnosis not present

## 2023-05-22 DIAGNOSIS — L821 Other seborrheic keratosis: Secondary | ICD-10-CM | POA: Diagnosis not present

## 2023-10-06 ENCOUNTER — Other Ambulatory Visit: Payer: Self-pay | Admitting: Internal Medicine

## 2023-10-06 DIAGNOSIS — Z1231 Encounter for screening mammogram for malignant neoplasm of breast: Secondary | ICD-10-CM

## 2023-10-23 ENCOUNTER — Ambulatory Visit
Admission: RE | Admit: 2023-10-23 | Discharge: 2023-10-23 | Disposition: A | Discharge status: Home / Self Care | Source: Ambulatory Visit | Attending: Internal Medicine | Admitting: Internal Medicine

## 2023-10-23 DIAGNOSIS — Z1231 Encounter for screening mammogram for malignant neoplasm of breast: Secondary | ICD-10-CM | POA: Diagnosis not present

## 2024-02-25 ENCOUNTER — Ambulatory Visit: Payer: Self-pay

## 2024-02-25 NOTE — Telephone Encounter (Signed)
 FYI Only or Action Required?: FYI only for provider: appointment scheduled on 02/26/2024.  Patient was last seen in primary care on 03/11/2023 by Glendia Shad, MD.  Called Nurse Triage reporting Fall.  Symptoms began several weeks ago.  Interventions attempted: Nothing.  Symptoms are: gradually worsening.  Triage Disposition: See Physician Within 24 Hours  Patient/caregiver understands and will follow disposition?: Yes         Copied from CRM #[id number]. Topic: Clinical - Red Word Triage >> Feb 25, 2024  8:59 AM Thersia BROCKS wrote: [facility] that prompted transfer to Nurse Triage: Patient fell a few weeks ago, stated she hit the floor really hard and is still having problems since then. stated she not alot of pain but is in some pain. Stated her thumb is having tingling sensation and not sure if that has came from the fall as well    ----------------------------------------------------------------------- From previous Reason for Contact - Scheduling: Patient/patient representative is calling to schedule an appointment. Refer to attachments for appointment information. Reason for Disposition  [1] MODERATE pain (e.g., interferes with normal activities, limping) AND [2] high-risk adult (e.g., age > 60 years, osteoporosis, chronic steroid use)  Answer Assessment - Initial Assessment Questions 1. MECHANISM: How did the fall happen?     She states she tripped and fell due to the flooring in her house.  2. DOMESTIC VIOLENCE AND ELDER ABUSE SCREENING: Did you fall because someone pushed you or tried to hurt you? If Yes, ask: Are you safe now?     Denies  3. ONSET: When did the fall happen? (e.g., minutes, hours, or days ago)     A few weeks ago  4. LOCATION: What part of the body hit the ground? (e.g., back, buttocks, head, hips, knees, hands, head, stomach)     R side of her body from her shoulders down  5. INJURY: Did you hurt (injure) yourself when you fell? If Yes, ask:  What did you injure? Tell me more about this? (e.g., body area; type of injury; pain severity)     Shoulder and hip  6. PAIN: Is there any pain? If Yes, ask: How bad is the pain? (e.g., Scale 0-10; or none, mild,      Moderate  7. SIZE: For cuts, bruises, or swelling, ask: How large is it? (e.g., inches or centimeters)      Denies  9. OTHER SYMPTOMS: Do you have any other symptoms? (e.g., dizziness, fever, weakness; new-onset or worsening).      Tingling in L thumb  10. CAUSE: What do you think caused the fall (or falling)? (e.g., dizzy spell, tripped)       Tripped  Protocols used: Hip Injury-A-AH

## 2024-02-26 ENCOUNTER — Ambulatory Visit: Admitting: Nurse Practitioner

## 2024-02-26 ENCOUNTER — Encounter: Payer: Self-pay | Admitting: Nurse Practitioner

## 2024-02-26 VITALS — BP 128/82 | HR 92 | Temp 99.0°F | Ht 60.0 in | Wt 124.8 lb

## 2024-02-26 DIAGNOSIS — M545 Low back pain, unspecified: Secondary | ICD-10-CM | POA: Diagnosis not present

## 2024-02-26 MED ORDER — METHYLPREDNISOLONE 4 MG PO TBPK: ORAL_TABLET | ORAL | 0 refills | Status: AC

## 2024-02-26 NOTE — Progress Notes (Unsigned)
 Established Patient Office Visit  Subjective:  [name], female    DOB: May 20, 1940  Age: 83 y.o. MRN: [id number]  CC:  Chief Complaint  Patient presents with   Acute Visit    Right side back pain x 2 weeks Pain 5/10   Discussed the use of AI scribe software for clinical note transcription with the patient, who gave verbal consent to proceed.  History of Present Illness  Discussed the use of AI scribe software for clinical note transcription with the patient, who gave verbal consent to proceed.  History of Present Illness   Donna Lara is an 83 year old female who presents with low back pain.  She has experienced low back pain for the past couple of weeks, localized to the lower back without radiation. The pain varies with activity, especially when getting out of bed. She attributes some discomfort to sleeping on a softer bed during a visit to her daughter's home in [place] . She prefers a firm bed.   She manages intermittent low back pain by avoiding heavy lifting and using a heating pad. Over-the-counter Advil and heat application provide relief. There is no bowel or urinary incontinence. No numbness or tingling is present. Recent travel to [place]  involved a long car ride, which may have exacerbated her symptoms.     Past Medical History:  Diagnosis Date   Allergy    Carpal tunnel syndrome 1999   GERD (gastroesophageal reflux disease)    Hypercholesterolemia    By last lipid panel   Hypertension    Borderline. She does not take the 12.5 HCTZ daily    Past Surgical History:  Procedure Laterality Date   APPENDECTOMY  1963   BREAST CYST ASPIRATION Right 07/12/2020   CARPAL TUNNEL RELEASE     right hand   COLONOSCOPY     COLONOSCOPY WITH PROPOFOL  N/A 12/01/2016   Procedure: COLONOSCOPY WITH PROPOFOL ;  Surgeon: Jinny Carmine, MD;  Location: [facility];  Service: Gastroenterology;  Laterality: N/A;   EYE SURGERY     left   POLYPECTOMY  12/01/2016    Procedure: POLYPECTOMY INTESTINAL;  Surgeon: Jinny Carmine, MD;  Location: [facility];  Service: Gastroenterology;;  Transverse colon polyp x 2 Ascending colon polyp x 3   TONSILLECTOMY      Family History  Problem Relation Age of Onset   Diabetes Mother    Hypertension Brother    Diabetes Maternal Grandmother    Asthma Daughter    Breast cancer Other    Colon cancer Neg Hx     Social History   Socioeconomic History   Marital status: Married    Spouse name: Not on file   Number of children: 3   Years of education: Not on file   Highest education level: Not on file  Occupational History    Employer: OTHER  Tobacco Use   Smoking status: Never   Smokeless tobacco: Never  Vaping Use   Vaping status: Never Used  Substance and Sexual Activity   Alcohol use: Yes    Alcohol/week: 0.0 standard drinks of alcohol    Comment: 2-3 drinks/month   Drug use: No   Sexual activity: Never  Other Topics Concern   Not on file  Social History Narrative   Recently widowed. Her husband died of a heart attack in 16-Apr-2015  Social Drivers of Health   Financial Resource Strain: Low Risk  (11/10/2017)   Overall Financial Resource Strain (CARDIA)  Difficulty of Paying Living Expenses: Not hard at all  Food Insecurity: No Food Insecurity (12/11/2021)   Hunger Vital Sign    Worried About Running Out of Food in the Last Year: Never true    Ran Out of Food in the Last Year: Never true  Transportation Needs: No Transportation Needs (12/11/2021)   PRAPARE - Administrator, Civil Service (Medical): No    Lack of Transportation (Non-Medical): No  Physical Activity: Sufficiently Active (11/10/2017)   Exercise Vital Sign    Days of Exercise per Week: 4 days    Minutes of Exercise per Session: 90 min  Stress: No Stress Concern Present (11/10/2017)   [employer] - Occupational Stress Questionnaire    Feeling of Stress : Not at all  Social Connections:  Not on file  Intimate Partner Violence: Not At Risk (08/04/2022)   Received from [facility]   Humiliation, Afraid, Rape, and Kick questionnaire    Within the last year, have you been afraid of your partner or ex-partner?: No    Within the last year, have you been humiliated or emotionally abused in other ways by your partner or ex-partner?: No    Within the last year, have you been kicked, hit, slapped, or otherwise physically hurt by your partner or ex-partner?: No    Within the last year, have you been raped or forced to have any kind of sexual activity by your partner or ex-partner?: No     Outpatient Medications Prior to Visit  Medication Sig Dispense Refill   fluticasone  (FLONASE ) 50 MCG/ACT nasal spray Place 2 sprays into both nostrils daily. 16 g 6   magnesium 30 MG tablet Take 30 mg by mouth 1 day or 1 dose.     Nutritional Supplements (JOINT FORMULA PO) Take by mouth.     amLODipine  (NORVASC ) 5 MG tablet Take 1 tablet (5 mg total) by mouth daily. (Patient not taking: Reported on 02/26/2024) 90 tablet 1   Cholecalciferol (VITAMIN D3) 1000 UNITS CAPS Take 1,000 Units by mouth 2 (two) times daily.  (Patient not taking: Reported on 02/26/2024)     CYANOCOBALAMIN  PO Take 1 tablet by mouth daily.  (Patient not taking: Reported on 02/26/2024)     diphenhydrAMINE HCl, Sleep, (ZZZQUIL PO) Take by mouth. 3 GUMMIES PER NIGHT (Patient not taking: Reported on 02/26/2024)     fexofenadine  (ALLEGRA  ALLERGY) 60 MG tablet Take 1 tablet (60 mg total) by mouth 2 (two) times daily. (Patient not taking: Reported on 02/26/2024) 60 tablet 1   Potassium 95 MG TABS Take by mouth. (Patient not taking: Reported on 02/26/2024)     rosuvastatin  (CRESTOR ) 10 MG tablet Take 1 tablet (10 mg total) by mouth daily. (Patient not taking: Reported on 02/26/2024) 90 tablet 1   sertraline  (ZOLOFT ) 25 MG tablet Take 1 tablet (25 mg total) by mouth daily. (Patient not taking: Reported on 02/26/2024) 30 tablet 2   No  facility-administered medications prior to visit.    Allergies  Allergen Reactions   Codeine Other (See Comments)    Make pt alert, nervous, agitated   Insomnia. Out of control.   Codeine Sulfate     Insomnia. Out of control.    ROS Review of Systems Negative unless indicated in HPI.    Objective:    Physical Exam Constitutional:      Appearance: Normal appearance.  HENT:     Mouth/Throat:     Mouth: Mucous membranes are moist.  Eyes:     Conjunctiva/sclera: Conjunctivae normal.     Pupils: Pupils are equal, round, and reactive to light.  Cardiovascular:     Rate and Rhythm: Normal rate and regular rhythm.     Pulses: Normal pulses.     Heart sounds: Normal heart sounds.  Pulmonary:     Effort: Pulmonary effort is normal.     Breath sounds: Normal breath sounds.  Musculoskeletal:        General: No tenderness.     Cervical back: Normal range of motion. No tenderness.     Right lower leg: No edema.     Left lower leg: No edema.  Skin:    General: Skin is warm.     Findings: No bruising.  Neurological:     General: No focal deficit present.     Mental Status: She is alert and oriented to person, place, and time. Mental status is at baseline.  Psychiatric:        Mood and Affect: Mood normal.        Behavior: Behavior normal.        Thought Content: Thought content normal.        Judgment: Judgment normal.     BP 128/82   Pulse 92   Temp 99 F (37.2 C)   Ht 5' (1.524 m)   Wt 124 lb 12.8 oz (56.6 kg)   LMP 04/28/1992   SpO2 97%   BMI 24.37 kg/m  Wt Readings from Last 3 Encounters:  02/26/24 124 lb 12.8 oz (56.6 kg)  03/11/23 126 lb 9.6 oz (57.4 kg)  01/13/23 126 lb 8 oz (57.4 kg)     Health Maintenance  Topic Date Due   DTaP/Tdap/Td (1 - Tdap) Never done   Zoster Vaccines- Shingrix (1 of 2) Never done   Medicare Annual Wellness (AWV)  11/11/2018   Colonoscopy  12/01/2021   COVID-19 Vaccine (1 - 2025-26 season) Never done   Influenza Vaccine   07/05/2024 (Originally 11/06/2023)   Pneumococcal Vaccine: 50+ Years  Completed   Bone Density Scan  Completed   Meningococcal B Vaccine  Aged Out    There are no preventive care reminders to display for this patient.  Lab Results  Component Value Date   TSH 1.14 08/21/2022   Lab Results  Component Value Date   WBC 8.0 12/25/2022   HGB 13.4 12/25/2022   HCT 41.9 12/25/2022   MCV 89.0 12/25/2022   PLT 265.0 12/25/2022   Lab Results  Component Value Date   NA 139 12/25/2022   K 4.1 12/25/2022   CO2 27 12/25/2022   GLUCOSE 90 12/25/2022   BUN 18 12/25/2022   CREATININE 0.85 12/25/2022   BILITOT 0.7 12/25/2022   ALKPHOS 81 12/25/2022   AST 25 12/25/2022   ALT 23 12/25/2022   PROT 7.2 12/25/2022   ALBUMIN 4.2 12/25/2022   CALCIUM  9.7 12/25/2022   ANIONGAP 10 12/12/2022   GFR 64.96 12/25/2022   Lab Results  Component Value Date   CHOL 142 12/25/2022   Lab Results  Component Value Date   HDL 69.80 12/25/2022   Lab Results  Component Value Date   LDLCALC 58 12/25/2022   Lab Results  Component Value Date   TRIG 71.0 12/25/2022   Lab Results  Component Value Date   CHOLHDL 2 12/25/2022   Lab Results  Component Value Date   HGBA1C 6.0 12/25/2022      Assessment & Plan:   Assessment & Plan Acute  right-sided low back pain without sciatica Intermittent right sided low back pain without sciatica. No neurological symptoms. Recent travel to [place]  involved a long car ride, which may have exacerbated her symptoms.  - Will treat with Medrol  Dosepak.  - Advised continued use of heating pad. - Instructed to avoid lifting heavy objects. - Advised to report if symptoms do not improve.    Orders:   methylPREDNISolone  (MEDROL  DOSEPAK) 4 MG TBPK tablet; As directed   Assessment and Plan Assessment & Plan    Follow-up: Return if symptoms worsen or fail to improve.   Antonette Hendricks, NP

## 2024-02-26 NOTE — Patient Instructions (Signed)
 You have been prescribed a Medrol  Dosepak to help with inflammation. -You should take Tylenol  for pain relief. -Continue using a heating pad to help manage the pain. -Avoid lifting heavy objects to prevent further strain. -Please report back if your symptoms do not improve.

## 2024-03-25 ENCOUNTER — Ambulatory Visit: Payer: Self-pay

## 2024-03-25 NOTE — Telephone Encounter (Signed)
 FYI Only or Action Required?: FYI only for provider: urgent care advised.  Patient was last seen in primary care on 02/26/2024 by Vincente Saber, NP.  Called Nurse Triage reporting Fall and Hip Injury.  Symptoms began several months ago.  Interventions attempted: Nothing.  Symptoms are: gradually worsening.  Triage Disposition: See Physician Within 24 Hours  Patient/caregiver understands and will follow disposition?: Yes                 Copied from CRM #[id number]. Topic: Clinical - Red Word Triage >> Mar 25, 2024 10:05 AM Franky GRADE wrote: Red Word that prompted transfer to Nurse Triage: Patient suffered a fall during the summer and did not seek medical attention, since patient has been experiencing worsening pain on the right side, pain is so bad at times that she isn't even able to walk. Reason for Disposition  [1] MODERATE pain (e.g., interferes with normal activities, limping) AND [2] high-risk adult (e.g., age > 60 years, osteoporosis, chronic steroid use)  Answer Assessment - Initial Assessment Questions 1. MECHANISM: How did the injury happen? (e.g., twisting injury, direct blow)      Trip and fall. She states she thinks it was the shoes she was wearing, caused her to fall at home and land on the hardwood floor. Landed on the right side/hip/leg.   2. ONSET: When did the injury happen? (e.g., minutes, hours ago)      End of August.  3. LOCATION: Where is the injury located?      Right hip and leg.  4. APPEARANCE of INJURY: What does the injury look like?  (e.g., deformity of leg)     No bruising, swelling or deformity.  5. SEVERITY: Can you put weight on that leg? Can you walk?      Yes, she states she can walk but it is painful.  6. SIZE: For cuts, bruises, or swelling, ask: How large is it? (e.g., inches or centimeters;  entire joint)      No.  7. PAIN: Is there pain? If Yes, ask: How bad is the pain?   What does it keep you from  doing? (Scale 0-10; or none, mild, moderate, severe)     7-8/10.  8. TETANUS: For any breaks in the skin, ask: When was your last tetanus booster?     N/A.  9. OTHER SYMPTOMS: Do you have any other symptoms?      No.  Protocols used: Hip Injury-A-AH
